# Patient Record
Sex: Female | Born: 1980
Health system: Southern US, Community
[De-identification: ages and names within clinical notes are randomized; demographics above are authoritative.]

## PROBLEM LIST (undated history)

## (undated) ENCOUNTER — Emergency Department (HOSPITAL_COMMUNITY): Admission: EM | Payer: No Typology Code available for payment source

## (undated) DIAGNOSIS — R87629 Unspecified abnormal cytological findings in specimens from vagina: Secondary | ICD-10-CM

## (undated) DIAGNOSIS — I10 Essential (primary) hypertension: Secondary | ICD-10-CM

## (undated) DIAGNOSIS — K8301 Primary sclerosing cholangitis: Secondary | ICD-10-CM

## (undated) DIAGNOSIS — Z8489 Family history of other specified conditions: Secondary | ICD-10-CM

## (undated) DIAGNOSIS — K5190 Ulcerative colitis, unspecified, without complications: Secondary | ICD-10-CM

## (undated) DIAGNOSIS — B009 Herpesviral infection, unspecified: Secondary | ICD-10-CM

## (undated) HISTORY — DX: Herpesviral infection, unspecified: B00.9

## (undated) HISTORY — PX: CERCLAGE REMOVAL: SUR1451

## (undated) HISTORY — DX: Unspecified abnormal cytological findings in specimens from vagina: R87.629

## (undated) HISTORY — DX: Family history of other specified conditions: Z84.89

## (undated) HISTORY — PX: LEEP: SHX91

## (undated) HISTORY — DX: Primary sclerosing cholangitis: K83.01

---
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
## 2001-06-02 ENCOUNTER — Inpatient Hospital Stay (HOSPITAL_COMMUNITY): Admission: AD | Admit: 2001-06-02 | Discharge: 2001-06-05 | Payer: Self-pay | Admitting: Obstetrics and Gynecology

## 2001-06-02 ENCOUNTER — Encounter (INDEPENDENT_AMBULATORY_CARE_PROVIDER_SITE_OTHER): Payer: Self-pay | Admitting: Specialist

## 2001-06-03 ENCOUNTER — Encounter: Payer: Self-pay | Admitting: *Deleted

## 2001-06-11 ENCOUNTER — Inpatient Hospital Stay (HOSPITAL_COMMUNITY): Admission: AD | Admit: 2001-06-11 | Discharge: 2001-06-11 | Payer: Self-pay | Admitting: Obstetrics and Gynecology

## 2001-07-16 ENCOUNTER — Encounter: Admission: RE | Admit: 2001-07-16 | Discharge: 2001-07-16 | Payer: Self-pay | Admitting: *Deleted

## 2001-07-16 ENCOUNTER — Other Ambulatory Visit: Admission: RE | Admit: 2001-07-16 | Discharge: 2001-07-16 | Payer: Self-pay | Admitting: *Deleted

## 2004-03-24 ENCOUNTER — Emergency Department (HOSPITAL_COMMUNITY): Admission: EM | Admit: 2004-03-24 | Discharge: 2004-03-24 | Payer: Self-pay | Admitting: Family Medicine

## 2006-07-16 ENCOUNTER — Ambulatory Visit (HOSPITAL_COMMUNITY): Admission: RE | Admit: 2006-07-16 | Discharge: 2006-07-16 | Payer: Self-pay

## 2007-04-13 ENCOUNTER — Encounter: Admission: RE | Admit: 2007-04-13 | Discharge: 2007-04-13 | Payer: Self-pay | Admitting: Family Medicine

## 2007-04-28 ENCOUNTER — Ambulatory Visit (HOSPITAL_COMMUNITY): Admission: RE | Admit: 2007-04-28 | Discharge: 2007-04-28 | Payer: Self-pay | Admitting: Obstetrics and Gynecology

## 2007-06-14 ENCOUNTER — Ambulatory Visit (HOSPITAL_COMMUNITY): Admission: RE | Admit: 2007-06-14 | Discharge: 2007-06-14 | Payer: Self-pay | Admitting: Obstetrics and Gynecology

## 2007-06-25 ENCOUNTER — Ambulatory Visit (HOSPITAL_COMMUNITY): Admission: RE | Admit: 2007-06-25 | Discharge: 2007-06-25 | Payer: Self-pay | Admitting: Obstetrics and Gynecology

## 2007-07-06 ENCOUNTER — Encounter: Admission: RE | Admit: 2007-07-06 | Discharge: 2007-07-06 | Payer: Self-pay | Admitting: Obstetrics and Gynecology

## 2007-07-13 ENCOUNTER — Ambulatory Visit (HOSPITAL_COMMUNITY): Admission: RE | Admit: 2007-07-13 | Discharge: 2007-07-13 | Payer: Self-pay | Admitting: Obstetrics and Gynecology

## 2007-08-11 ENCOUNTER — Ambulatory Visit (HOSPITAL_COMMUNITY): Admission: RE | Admit: 2007-08-11 | Discharge: 2007-08-11 | Payer: Self-pay | Admitting: Obstetrics and Gynecology

## 2007-08-16 ENCOUNTER — Ambulatory Visit (HOSPITAL_COMMUNITY): Admission: RE | Admit: 2007-08-16 | Discharge: 2007-08-16 | Payer: Self-pay | Admitting: Obstetrics and Gynecology

## 2007-08-26 ENCOUNTER — Encounter: Payer: Self-pay | Admitting: Obstetrics and Gynecology

## 2007-08-26 ENCOUNTER — Inpatient Hospital Stay (HOSPITAL_COMMUNITY): Admission: AD | Admit: 2007-08-26 | Discharge: 2007-08-29 | Payer: Self-pay | Admitting: Obstetrics and Gynecology

## 2007-08-26 ENCOUNTER — Encounter (INDEPENDENT_AMBULATORY_CARE_PROVIDER_SITE_OTHER): Payer: Self-pay | Admitting: Obstetrics and Gynecology

## 2007-08-30 ENCOUNTER — Encounter: Admission: RE | Admit: 2007-08-30 | Discharge: 2007-08-31 | Payer: Self-pay | Admitting: Obstetrics and Gynecology

## 2007-11-17 ENCOUNTER — Ambulatory Visit: Admission: RE | Admit: 2007-11-17 | Discharge: 2007-11-17 | Payer: Self-pay | Admitting: Obstetrics and Gynecology

## 2009-04-05 ENCOUNTER — Emergency Department (HOSPITAL_COMMUNITY): Admission: EM | Admit: 2009-04-05 | Discharge: 2009-04-05 | Payer: Self-pay | Admitting: Family Medicine

## 2009-04-13 ENCOUNTER — Emergency Department (HOSPITAL_COMMUNITY): Admission: EM | Admit: 2009-04-13 | Discharge: 2009-04-13 | Payer: Self-pay | Admitting: Family Medicine

## 2009-06-11 ENCOUNTER — Ambulatory Visit (HOSPITAL_COMMUNITY): Admission: RE | Admit: 2009-06-11 | Discharge: 2009-06-11 | Payer: Self-pay | Admitting: Gastroenterology

## 2009-07-13 IMAGING — US US OB FOLLOW-UP
1 series · 14 of 28 positions shown · non-contrast
Comparison: none

OBSTETRICAL ULTRASOUND:
 This ultrasound was performed in The [HOSPITAL], and the [facility] report will be stored to [REDACTED] PACS.

[Series 1: us ob follow-up · 97 acquisitions, 14 frames shown]
[im 4/97]
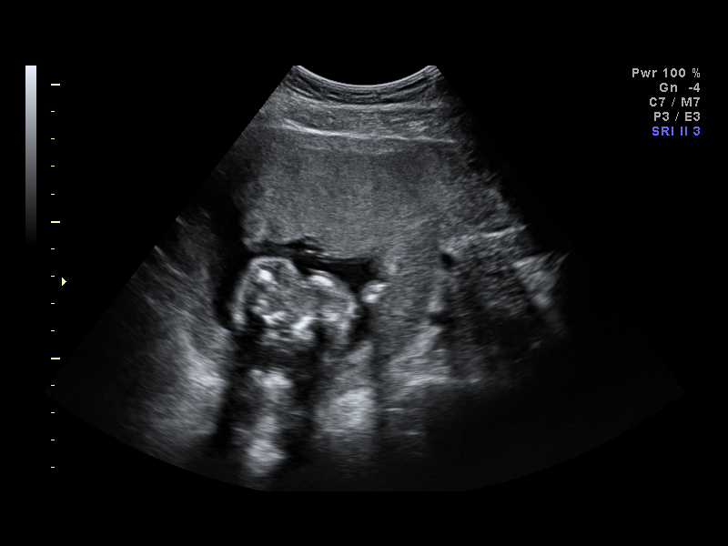
[im 11/97]
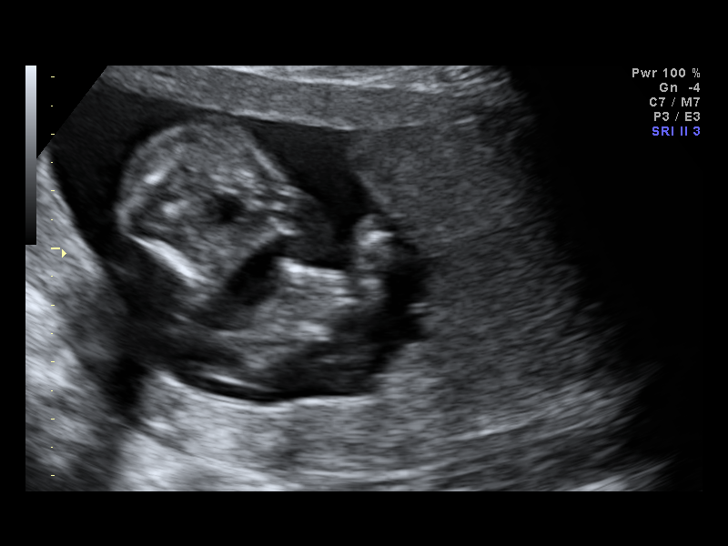
[im 18/97]
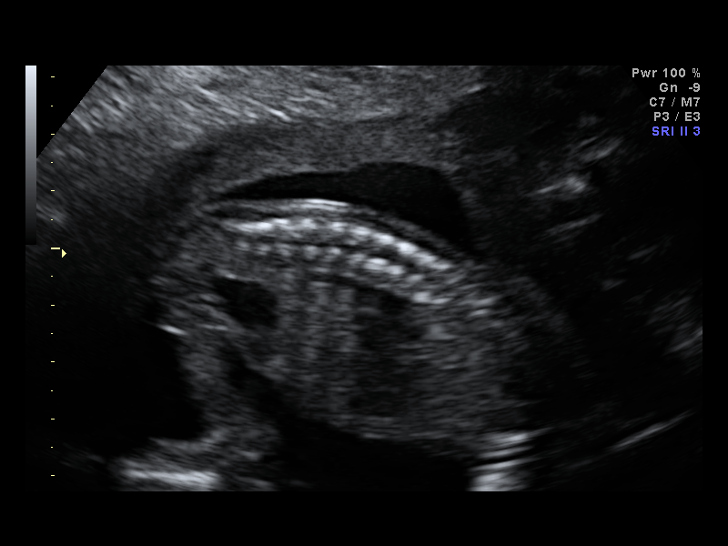
[im 25/97]
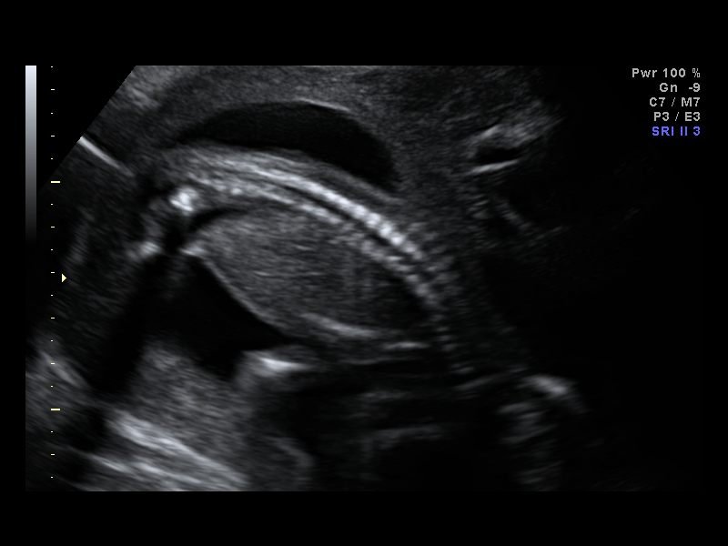
[im 33/97]
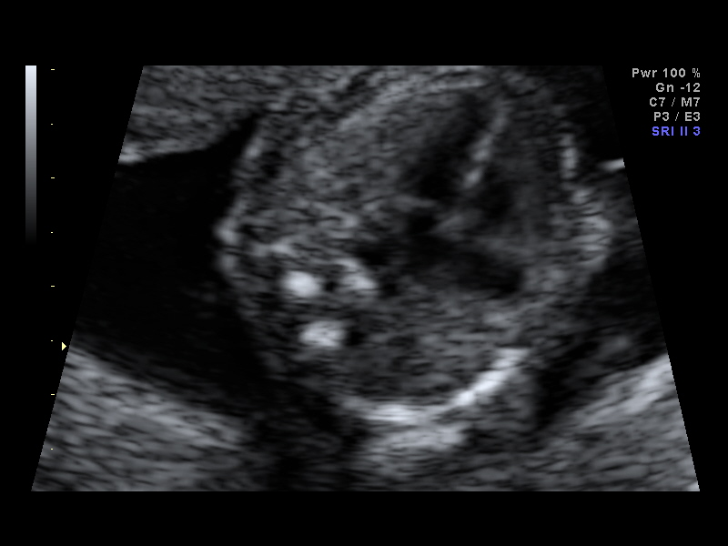
[im 40/97]
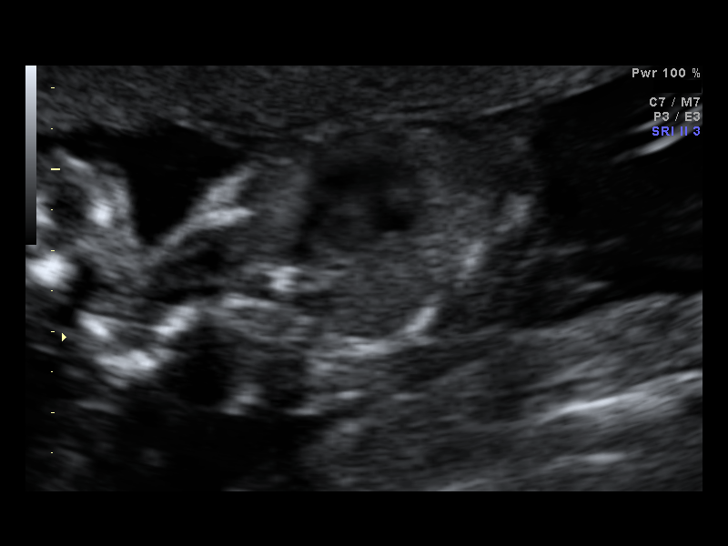
[im 47/97]
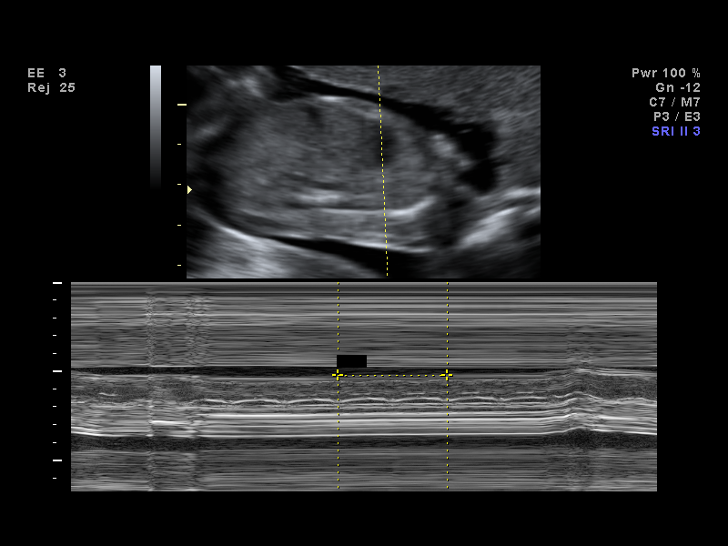
[im 54/97]
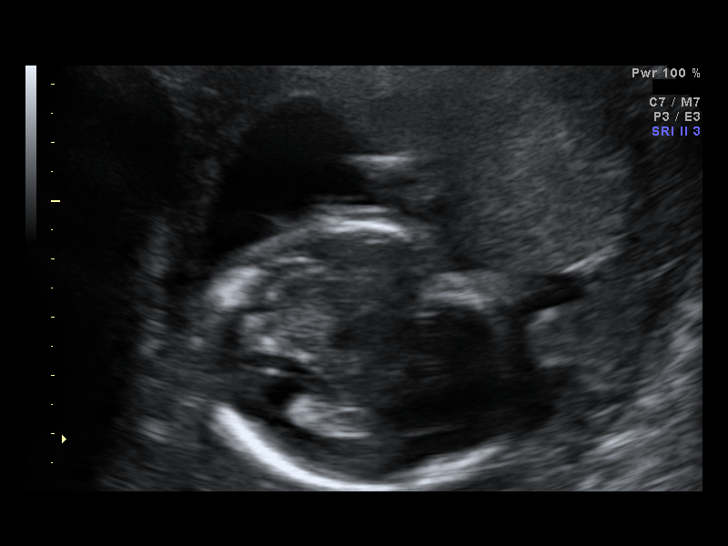
[im 61/97]
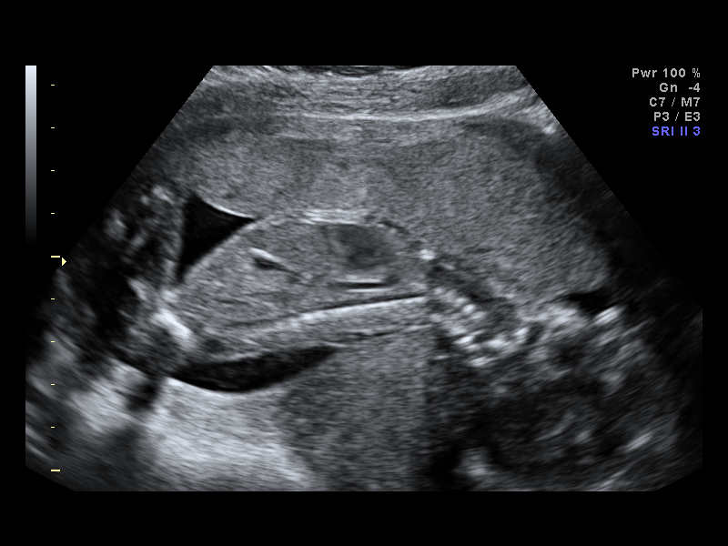
[im 68/97]
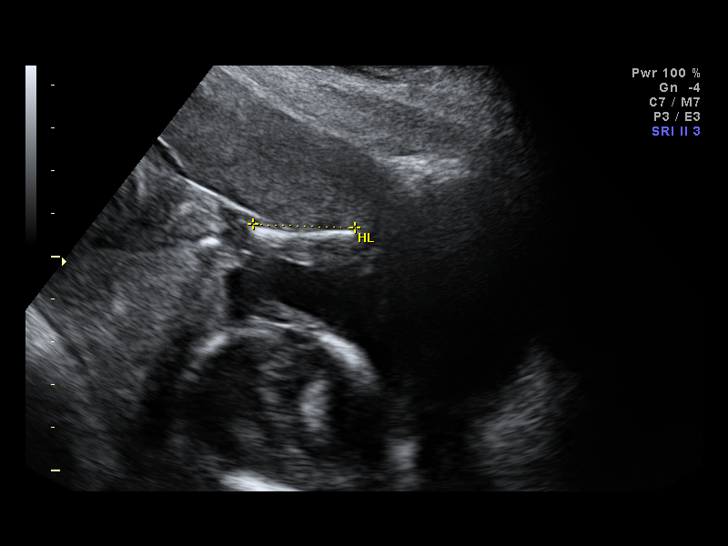
[im 75/97]
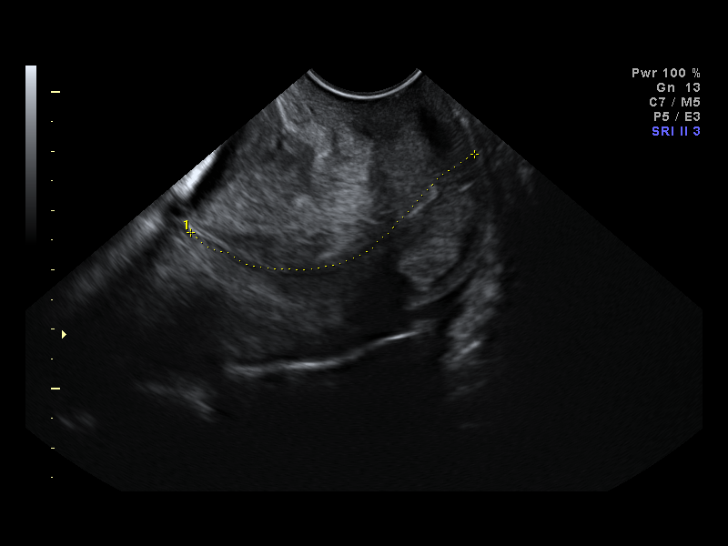
[im 82/97]
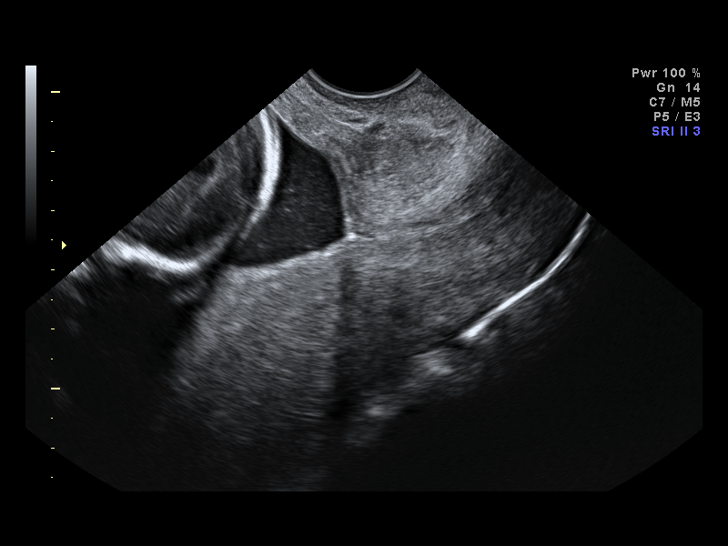
[im 89/97]
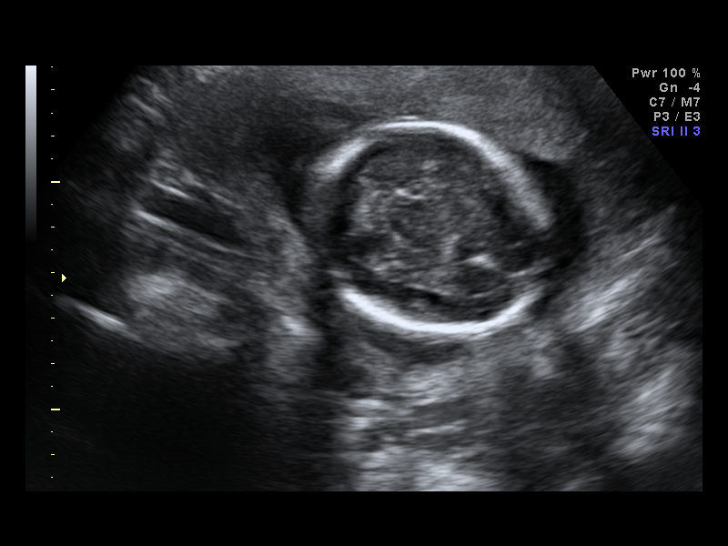
[im 97/97]
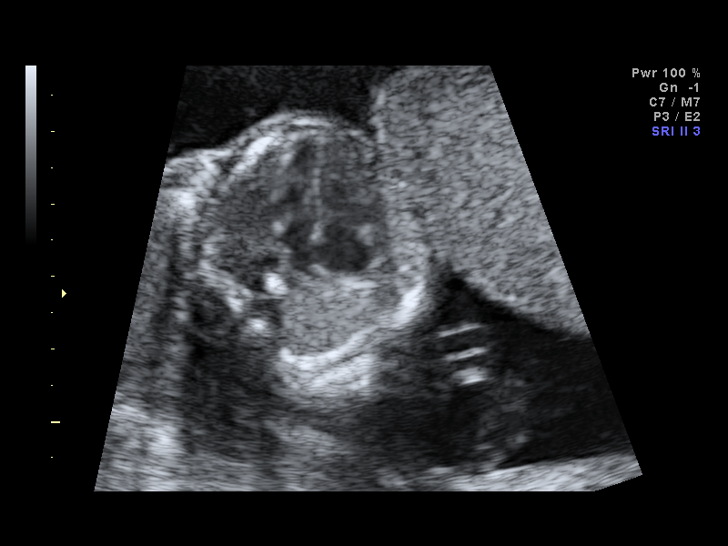

[14 of 28 positions shown; findings below may reference images not displayed]

IMPRESSION: The [facility] report has also been faxed to the ordering physician.

## 2009-11-28 ENCOUNTER — Ambulatory Visit (HOSPITAL_COMMUNITY): Admission: RE | Admit: 2009-11-28 | Discharge: 2009-11-28 | Payer: Self-pay | Admitting: Gastroenterology

## 2010-02-07 ENCOUNTER — Ambulatory Visit (HOSPITAL_COMMUNITY)
Admission: RE | Admit: 2010-02-07 | Discharge: 2010-02-07 | Payer: Self-pay | Source: Home / Self Care | Attending: Obstetrics and Gynecology | Admitting: Obstetrics and Gynecology

## 2010-03-09 ENCOUNTER — Encounter: Payer: Self-pay | Admitting: Gastroenterology

## 2010-07-02 ENCOUNTER — Inpatient Hospital Stay (INDEPENDENT_AMBULATORY_CARE_PROVIDER_SITE_OTHER)
Admission: RE | Admit: 2010-07-02 | Discharge: 2010-07-02 | Disposition: A | Discharge status: Home / Self Care | Payer: Self-pay | Source: Ambulatory Visit | Attending: Family Medicine | Admitting: Family Medicine

## 2010-07-02 DIAGNOSIS — S139XXA Sprain of joints and ligaments of unspecified parts of neck, initial encounter: Secondary | ICD-10-CM

## 2010-07-02 NOTE — Op Note (Signed)
[name], [name]            ACCOUNT NO.:  [id number]   MEDICAL RECORD NO.:  [id number]          PATIENT TYPE:  AMB   LOCATION:  SDC                           FACILITY:  [facility]   PHYSICIAN:  Maxie Better, M.D.DATE OF BIRTH:  1981/01/14   DATE OF PROCEDURE:  06/14/2007  DATE OF DISCHARGE:                               OPERATIVE REPORT   PREOPERATIVE DIAGNOSES:  1. Cervical incompetence.  2. Intrauterine gestation at 14-1/7 weeks.   PROCEDURE:  McDonald cerclage placement.   POSTOPERATIVE DIAGNOSES:  1. Cervical incompetence.  2. Intrauterine gestation of 14-1/7 weeks.   ANESTHESIA:  Spinal.   SURGEON:  Maxie Better, M.D.   ASSISTANT:  None.   INDICATION:  A 30 year old gravida 2, para 0 female, with second  trimester twin losses, who is now at 14-1/7 weeks, for cerclage  placement.  Surgical risk was reviewed with the patient, consent was  signed and the patient was transferred to the operating room.   PROCEDURE:  Under adequate spinal anesthesia the patient was placed in  the dorsal lithotomy position.  She was sterilely prepped and draped in  the usual fashion.  The bladder was catheterized for a moderate amount  of urine.  A weighted speculum was placed in the vagina.  The vagina was  prepped with sterile water.  The anterior and posterior lips of the  cervix were grasped with DeLee clamps and a #1 Prolene was placed in the  usual fashion as for a McDonald suture, and Ethibond was used for  tagging of the stitch anterior.  The suture knot was placed at the 12  o'clock position.  The procedure was then completed.  All instruments  were removed from the vagina.   SPECIMEN:  None.   ESTIMATED BLOOD LOSS:  Minimal.   COMPLICATIONS:  None.   The patient tolerated the procedure well and was transferred to recovery  in stable condition.      Maxie Better, M.D.  Electronically Signed     [place]/MEDQ  D:  06/14/2007  T:  06/14/2007  Job:  [id number]

## 2010-07-02 NOTE — Op Note (Signed)
NAMEGALILEA, QUITO            ACCOUNT NO.:  [id number]   MEDICAL RECORD NO.:  [id number]          PATIENT TYPE:  INP   LOCATION:  9106                          [facility]ACILITY:  [facility]   PHYSICIAN:  Maxie Better, M.D.DATE OF BIRTH:  1980-07-21   DATE OF PROCEDURE:  08/26/2007  DATE OF DISCHARGE:                               OPERATIVE REPORT   PREOPERATIVE DIAGNOSES:  1. Intrauterine growth restriction.  2. Anhydramnios.  3. Intrauterine gestation at 24-5/7th weeks.  4. Chronic hypertension.  5. Cervical incompetence.   PROCEDURES:  1. Primary cesarean section.  2. Classical hysterotomy.  3. Cerclage removal.   POSTOPERATIVE DIAGNOSES:  1. Intrauterine growth restriction.  2. Anhydramnios.  3. Chronic hypertension.  4. Intrauterine gestation at 24-5/7th weeks.  5. Cervical incompetence.   ANESTHESIA:  Spinal.   SURGEON:  Maxie Better, MD   ASSISTANT:  None.   INDICATIONS:  A 30 year old gravida 2, para 0-1-0-0 black female at 99-  5/7th weeks' gestation, who was found to have intrauterine growth  restriction with biophysical profile of 2/8 and anhydramnios, on  maternal fetal medicine evaluation on August 26, 2007.  The patient was  diagnosed with chronic hypertension, and was placed on labetalol and  Aldomet.  She was being followed for low fluid from about 21 weeks, and  subsequently had absence of fluid even though she has been documented to  have had no evidence of ruptured membranes.  Given the findings,  decision was made to proceed with delivery, a consent was signed,  procedure explained, the patient was transferred to the operating room.   PROCEDURE:  Under adequate spinal anesthesia, the patient was placed in  the supine position with a left lateral tilt.  She was sterilely prepped  and draped in usual fashion.  Indwelling Foley catheter was sterilely  placed.  A Pfannenstiel skin incision was then made and carried down to  the rectus fascia.  The  rectus fascia was opened transversely.  The  rectus fascia was then bluntly and sharply dissected off the rectus  muscle in a superior and inferior fashion.  The rectus muscles split in  midline.  The parietal peritoneum was entered bluntly and extended.  The  vesicouterine peritoneum was opened transversely.  A classical uterine  incision was then made and extended.  Subsequent careful delivery of a  live female was accomplished.  The baby was transferred to the awaiting  pediatrician, who assigned Apgars of 2, 5, and 6 at one, five, and ten  minutes.  Cord pH was obtained, which was 7.20.  The placenta was  manually removed and sent to pathology.  Uterine cavity was cleaned of  debris.  Uterine incision was closed with 0 Monocryl, running  interrupted lock stitch, and interrupted stitch onto the serosal surface  was reached, at which time 2-0 Monocryl suture was then used.  Bleeding  in the midportion of it required multiple Monocryl sutures with final  hemostasis accomplished.  Normal tubes and ovaries were noted  bilaterally.  The uterus was then returned back to the abdomen.  The  abdomen was then copiously irrigated and suctioned of  debris.  Reinspection of the site showed good hemostasis.  The parietal  peritoneum was closed with 2-0 Vicryl.  The rectus fascia was closed  with 0 Vicryl x2.  The subcutaneous area was closed with interrupted 2-0  plain, and the skin approximated using Ethicon staples.   SPECIMENS:  Placenta sent to pathology.   ESTIMATED BLOOD LOSS:  600 mL.   URINE OUTPUT:  1800 mL.   INTRAOPERATIVE FLUID:  2800 mL.   Sponge and instrument counts x2 was correct.  At that point, the patient  was then placed in a lithotomy position.  A bivalve speculum was then  placed in the vagina.  The cervix was finally grasped, and the cerclage  knot identified and cut without incident intact.  At that point, the  procedure was completed by then transferring the patient to  the recovery  room in stable condition.  Weight of the baby was 400 g, and is in the  neonatal intensive care unit.      Maxie Better, M.D.  Electronically Signed     [place]/MEDQ  D:  08/26/2007  T:  08/27/2007  Job:  [id number]

## 2010-07-05 NOTE — Discharge Summary (Signed)
Jamie Duarte, Jamie Duarte            ACCOUNT NO.:  [id number]   MEDICAL RECORD NO.:  [id number]          PATIENT TYPE:  INP   LOCATION:  9106                          [facility]ILITY:  [facility]   PHYSICIAN:  Maxie Better, M.D.DATE OF BIRTH:  November 20, 1980   DATE OF ADMISSION:  08/26/2007  DATE OF DISCHARGE:  08/29/2007                               DISCHARGE SUMMARY   ADMISSION DIAGNOSES:  1. Chronic hypertension.  2. Intrauterine growth restriction with abnormal fetal tracing      (biophysical profile 2/8, absent end-diastolic flow).   DISCHARGE DIAGNOSES:  1. Intrauterine gestation 24 weeks, delivered.  2. Chronic hypertension.  3. Abnormal fetal testing.   PROCEDURE:  Classical cesarean section.   HISTORY OF PRESENT ILLNESS:  A 30 year old gravida 2, para 0-1-0-0  female at 32 and 5/7 weeks with chronic hypertension, was found by  Maternal-Fetal Medicine to  have a biophysical profile of 2/8, absent  end-diastolic flow.  The patient with known intrauterine growth  restriction starting about 2 weeks previously.  She has underlying  chronic hypertension.  Given the findings, recommendation is to deliver.   HOSPITAL COURSE:  The patient was admitted.  She went to the operating  room, 400 g live female was delivered with Apgars of 2, 5, and 6.  Baby  was transferred to NICU.  Cord pH was 7.2.  Placental pathology showed  immature placenta.  Her CBC on postop day #1 showed a hemoglobin of  11.5, hematocrit 33.6, white count of 13.7, and platelets 220,000.  Postop day #3, the patient was doing well.  She was continued on her  blood pressure medications and deemed likely to be discharged home.   DISPOSITION:  Home.   CONDITION:  Stable.   DISCHARGE MEDICATIONS:  1. Labetalol 300 mg p.o. b.i.d.  2. Aldomet 250 mg p.o. b.i.d.  3. Asacol.  4. Urso Forte.  5. Prenatal vitamins.  6. Percocet 1-2 tablets every 4 to 6 hours p.r.n. pain.   FOLLOWUP APPOINTMENT:  At [facility] on  Thursday for staple removal  and 6 weeks for regular postpartum care.   DISCHARGE INSTRUCTIONS:  Per the postpartum booklet given.      Maxie Better, M.D.  Electronically Signed     [place]/MEDQ  D:  09/23/2007  T:  09/23/2007  Job:  [id number]

## 2010-11-12 LAB — CBC
HCT: 33.3 — ABNORMAL LOW
Hemoglobin: 11.8 — ABNORMAL LOW
MCHC: 35.4
MCV: 87.5
Platelets: 207
RBC: 3.81 — ABNORMAL LOW
RDW: 12.5
WBC: 6

## 2010-11-14 LAB — CBC
HCT: 33.6 — ABNORMAL LOW
HCT: 39.6
Hemoglobin: 11.5 — ABNORMAL LOW
Hemoglobin: 13.5
MCHC: 34.1
MCHC: 34.4
MCV: 92.7
MCV: 93.2
Platelets: 220
Platelets: 255
RBC: 3.6 — ABNORMAL LOW
RBC: 4.28
RDW: 13.2
RDW: 13.3
WBC: 13.7 — ABNORMAL HIGH
WBC: 8.1

## 2010-11-14 LAB — TOXOPLASMA ANTIBODIES- IGG AND  IGM
Toxoplasma Antibody- IgM: 0.56 IV
Toxoplasma IgG Ratio: 1.4 IU/mL

## 2010-11-14 LAB — HSV(HERPES SMPLX)ABS-I+II(IGG+IGM)-BLD
Herpes Simplex Vrs I + II Ab, IgG: 0.2 IV
Herpes Simplex Vrs I&II-IgM Ab (EIA): NOT DETECTED

## 2010-11-14 LAB — CYTOMEGALOVIRUS ANTIBODY, IGG: Cytomegalovirus(CMV) Antibody, IgG: POSITIVE — AB

## 2010-11-14 LAB — RPR: RPR Ser Ql: NONREACTIVE

## 2010-11-14 LAB — CMV IGM: CMV IgM: <0.9 Index (ref <0.90)

## 2011-08-04 ENCOUNTER — Ambulatory Visit (HOSPITAL_COMMUNITY): Admission: RE | Admit: 2011-08-04 | Payer: Self-pay | Source: Ambulatory Visit | Admitting: Gastroenterology

## 2011-08-04 ENCOUNTER — Encounter (HOSPITAL_COMMUNITY): Admission: RE | Payer: Self-pay | Source: Ambulatory Visit

## 2011-08-04 SURGERY — COLONOSCOPY
Anesthesia: Moderate Sedation

## 2013-11-18 NOTE — Addendum Note (Signed)
Encounter addended by: Theodis Blaze, MD on: 11/18/2013  3:16 PM<BR>     Documentation filed: Orders

## 2014-02-21 ENCOUNTER — Other Ambulatory Visit: Payer: Self-pay | Admitting: Gastroenterology

## 2014-02-22 ENCOUNTER — Other Ambulatory Visit: Payer: Self-pay | Admitting: Gastroenterology

## 2014-02-22 DIAGNOSIS — R945 Abnormal results of liver function studies: Principal | ICD-10-CM

## 2014-02-22 DIAGNOSIS — R7989 Other specified abnormal findings of blood chemistry: Secondary | ICD-10-CM

## 2014-03-07 ENCOUNTER — Other Ambulatory Visit: Payer: Self-pay | Admitting: Gastroenterology

## 2014-03-07 ENCOUNTER — Ambulatory Visit (HOSPITAL_COMMUNITY)
Admission: RE | Admit: 2014-03-07 | Discharge: 2014-03-07 | Disposition: A | Discharge status: Home / Self Care | Payer: 59 | Source: Ambulatory Visit | Attending: Gastroenterology | Admitting: Gastroenterology

## 2014-03-07 DIAGNOSIS — R7989 Other specified abnormal findings of blood chemistry: Secondary | ICD-10-CM | POA: Insufficient documentation

## 2014-03-07 DIAGNOSIS — R945 Abnormal results of liver function studies: Secondary | ICD-10-CM

## 2014-03-07 MED ORDER — GADOBENATE DIMEGLUMINE 529 MG/ML IV SOLN
20.0000 mL | Freq: Once | INTRAVENOUS | Status: AC | PRN
  Administered 2014-03-07: 17 mL via INTRAVENOUS

## 2014-07-13 ENCOUNTER — Other Ambulatory Visit (HOSPITAL_COMMUNITY)
Admission: RE | Admit: 2014-07-13 | Discharge: 2014-07-13 | Disposition: A | Discharge status: Home / Self Care | Payer: 59 | Source: Ambulatory Visit | Attending: Gastroenterology | Admitting: Gastroenterology

## 2014-07-13 DIAGNOSIS — K5190 Ulcerative colitis, unspecified, without complications: Secondary | ICD-10-CM | POA: Diagnosis not present

## 2014-07-13 LAB — CBC WITH DIFFERENTIAL/PLATELET
Basophils Absolute: 0 10*3/uL (ref 0.0–0.1)
Basophils Relative: 0 % (ref 0–1)
Eosinophils Absolute: 0.2 10*3/uL (ref 0.0–0.7)
Eosinophils Relative: 6 % — ABNORMAL HIGH (ref 0–5)
HCT: 30.3 % — ABNORMAL LOW (ref 36.0–46.0)
Hemoglobin: 9.9 g/dL — ABNORMAL LOW (ref 12.0–15.0)
Lymphocytes Relative: 38 % (ref 12–46)
Lymphs Abs: 1.3 10*3/uL (ref 0.7–4.0)
MCH: 30.8 pg (ref 26.0–34.0)
MCHC: 32.7 g/dL (ref 30.0–36.0)
MCV: 94.4 fL (ref 78.0–100.0)
Monocytes Absolute: 0.5 10*3/uL (ref 0.1–1.0)
Monocytes Relative: 13 % — ABNORMAL HIGH (ref 3–12)
Neutro Abs: 1.5 10*3/uL — ABNORMAL LOW (ref 1.7–7.7)
Neutrophils Relative %: 43 % (ref 43–77)
Platelets: 315 10*3/uL (ref 150–400)
RBC: 3.21 MIL/uL — ABNORMAL LOW (ref 3.87–5.11)
RDW: 12.8 % (ref 11.5–15.5)
WBC: 3.4 10*3/uL — ABNORMAL LOW (ref 4.0–10.5)

## 2014-08-30 ENCOUNTER — Emergency Department (HOSPITAL_BASED_OUTPATIENT_CLINIC_OR_DEPARTMENT_OTHER): Payer: 59

## 2014-08-30 ENCOUNTER — Emergency Department (HOSPITAL_BASED_OUTPATIENT_CLINIC_OR_DEPARTMENT_OTHER)
Admission: EM | Admit: 2014-08-30 | Discharge: 2014-08-30 | Disposition: A | Discharge status: Home / Self Care | Payer: 59 | Attending: Emergency Medicine | Admitting: Emergency Medicine

## 2014-08-30 ENCOUNTER — Encounter (HOSPITAL_BASED_OUTPATIENT_CLINIC_OR_DEPARTMENT_OTHER): Payer: Self-pay | Admitting: Emergency Medicine

## 2014-08-30 DIAGNOSIS — R079 Chest pain, unspecified: Secondary | ICD-10-CM | POA: Diagnosis present

## 2014-08-30 DIAGNOSIS — Z79899 Other long term (current) drug therapy: Secondary | ICD-10-CM | POA: Diagnosis not present

## 2014-08-30 DIAGNOSIS — R0789 Other chest pain: Secondary | ICD-10-CM | POA: Diagnosis not present

## 2014-08-30 DIAGNOSIS — Z8719 Personal history of other diseases of the digestive system: Secondary | ICD-10-CM | POA: Insufficient documentation

## 2014-08-30 DIAGNOSIS — J90 Pleural effusion, not elsewhere classified: Secondary | ICD-10-CM | POA: Insufficient documentation

## 2014-08-30 DIAGNOSIS — I10 Essential (primary) hypertension: Secondary | ICD-10-CM | POA: Diagnosis not present

## 2014-08-30 HISTORY — DX: Ulcerative colitis, unspecified, without complications: K51.90

## 2014-08-30 HISTORY — DX: Essential (primary) hypertension: I10

## 2014-08-30 MED ORDER — IBUPROFEN 200 MG PO TABS
600.0000 mg | ORAL_TABLET | Freq: Once | ORAL | Status: AC
  Administered 2014-08-30: 600 mg via ORAL
  Filled 2014-08-30 (×2): qty 1

## 2014-08-30 NOTE — ED Notes (Signed)
Pt having left sided chest pain which reproduces with palpation and deep breath.  No sob, no N/V.  No diaphoresis.  Pt states she works out at [employer].

## 2014-08-30 NOTE — ED Notes (Signed)
Patient transported to X-ray 

## 2014-08-30 NOTE — ED Provider Notes (Signed)
CSN: [id number]     Arrival date & time 08/30/14  1004 History   First MD Initiated Contact with Patient 08/30/14 1113     Chief Complaint  Patient presents with  . Chest Pain     Patient is a 34 y.o. female presenting with chest pain. The history is provided by the patient. No language interpreter was used.  Chest Pain  [name] presents for evaluation of left sided chest pain.  She worked out in [employer] two days ago and yesterday developed left upper chest wall pain.  Pain is worse with ROM and deep breaths.  Pain is constant and gradually worsening.  She denies any fevers, SOB, abdominal pain, leg swelling or pain.  No hx/o DVT/PE.  She has UC, PSC and takes birth control. No recent surgery.  Sxs are moderate, constant, worsening.    Past Medical History  Diagnosis Date  . Hypertension   . Ulcerative colitis    Past Surgical History  Procedure Laterality Date  . Cesarean section     No family history on file. History  Substance Use Topics  . Smoking status: Never Smoker   . Smokeless tobacco: Not on file  . Alcohol Use: Not on file   OB History    No data available     Review of Systems  Cardiovascular: Positive for chest pain.  All other systems reviewed and are negative.     Allergies  Review of patient's allergies indicates no known allergies.  Home Medications   Prior to Admission medications   Medication Sig Start Date End Date Taking? Authorizing Provider  hydrochlorothiazide (HYDRODIURIL) 25 MG tablet Take 25 mg by mouth daily.   Yes Historical Provider, MD  metoprolol (LOPRESSOR) 50 MG tablet Take 50 mg by mouth 2 (two) times daily.   Yes Historical Provider, MD  ursodiol (ACTIGALL) 500 MG tablet Take 500 mg by mouth 3 (three) times daily.   Yes Historical Provider, MD   BP 132/77 mmHg  Pulse 78  Temp(Src) 99.1 F (37.3 C) (Oral)  Resp 18  Ht 5\' 7"  (1.702 m)  Wt 164 lb (74.39 kg)  BMI 25.68 kg/m2  SpO2 100%  LMP 08/21/2014 Physical Exam   Constitutional: She is oriented to person, place, and time. She appears well-developed and well-nourished.  HENT:  Head: Normocephalic and atraumatic.  Cardiovascular: Normal rate and regular rhythm.   No murmur heard. Pulmonary/Chest: Effort normal and breath sounds normal. No respiratory distress.  Left upper chest wall tenderness  Abdominal: Soft. There is no tenderness. There is no rebound and no guarding.  Musculoskeletal: She exhibits no edema or tenderness.  Neurological: She is alert and oriented to person, place, and time.  Skin: Skin is warm and dry.  Psychiatric: She has a normal mood and affect. Her behavior is normal.  Nursing note and vitals reviewed.   ED Course  Procedures (including critical care time) Labs Review Labs Reviewed - No data to display  Imaging Review Dg Chest 2 View  08/30/2014   CLINICAL DATA:  Left chest pain, pleuritic.  EXAM: CHEST  2 VIEW  COMPARISON:  None.  FINDINGS: Borderline enlargement of the cardiopericardial silhouette, without edema.  Trace blunting of the left posterior costophrenic angle.  The lungs appear otherwise clear.  IMPRESSION: 1. Trace left pleural effusion. 2. Borderline cardiomegaly.   Electronically Signed   By: Van Clines M.D.   On: 08/30/2014 12:03     EKG Interpretation   Date/Time:  Wednesday August 30 2014 10:15:58 EDT Ventricular Rate:  79 PR Interval:  150 QRS Duration: 82 QT Interval:  378 QTC Calculation: 433 R Axis:   58 Text Interpretation:  Normal sinus rhythm Normal ECG Confirmed by Hazle Coca [phone]) on 08/30/2014 10:25:32 AM      MDM   Final diagnoses:  Acute chest wall pain  Pleural effusion on left    Pt here for left sided chest pain, reproducible on exam.  Hx and presentation is not c/w ACS, dissection, PE.  No evidence of pna.  Discussed with pt home care for chest wall pain, need for follow up for trace pleural effusion.  Recommend home care with NSAIDs, return precautions discussed.       Quintella Reichert, MD 08/30/14 1252

## 2014-08-30 NOTE — Discharge Instructions (Signed)
Take ibuprofen, available over the counter for the next 5 days.  Your chest xray showed a very small amount of fluid on your left lung.  Please get this rechecked by your doctor.  Get rechecked immediately if you develop any new or worrisome symptoms.     Chest Wall Pain Chest wall pain is pain in or around the bones and muscles of your chest. It may take up to 6 weeks to get better. It may take longer if you must stay physically active in your work and activities.  CAUSES  Chest wall pain may happen on its own. However, it may be caused by:  A viral illness like the flu.  Injury.  Coughing.  Exercise.  Arthritis.  Fibromyalgia.  Shingles. HOME CARE INSTRUCTIONS   Avoid overtiring physical activity. Try not to strain or perform activities that cause pain. This includes any activities using your chest or your abdominal and side muscles, especially if heavy weights are used.  Put ice on the sore area.  Put ice in a plastic bag.  Place a towel between your skin and the bag.  Leave the ice on for 15-20 minutes per hour while awake for the first 2 days.  Only take over-the-counter or prescription medicines for pain, discomfort, or fever as directed by your caregiver. SEEK IMMEDIATE MEDICAL CARE IF:   Your pain increases, or you are very uncomfortable.  You have a fever.  Your chest pain becomes worse.  You have new, unexplained symptoms.  You have nausea or vomiting.  You feel sweaty or lightheaded.  You have a cough with phlegm (sputum), or you cough up blood. MAKE SURE YOU:   Understand these instructions.  Will watch your condition.  Will get help right away if you are not doing well or get worse. Document Released: 02/03/2005 Document Revised: 04/28/2011 Document Reviewed: 09/30/2010 [facility] Patient Information 2015 [place], [place]. This information is not intended to replace advice given to you by your health care provider. Make sure you discuss any questions  you have with your health care provider.  Pleurisy Pleurisy is an inflammation and swelling of the lining of the lungs (pleura). Because of this inflammation, it hurts to breathe. It can be aggravated by coughing, laughing, or deep breathing. Pleurisy is often caused by an underlying infection or disease.  HOME CARE INSTRUCTIONS  Monitor your pleurisy for any changes. The following actions may help to alleviate any discomfort you are experiencing:  Medicine may help with pain. Only take over-the-counter or prescription medicines for pain, discomfort, or fever as directed by your health care provider.  Only take antibiotic medicine as directed. Make sure to finish it even if you start to feel better. SEEK MEDICAL CARE IF:   Your pain is not controlled with medicine or is increasing.  You have an increase in pus-like (purulent) secretions brought up with coughing. SEEK IMMEDIATE MEDICAL CARE IF:   You have blue or dark lips, fingernails, or toenails.  You are coughing up blood.  You have increased difficulty breathing.  You have continuing pain unrelieved by medicine or pain lasting more than 1 week.  You have pain that radiates into your neck, arms, or jaw.  You develop increased shortness of breath or wheezing.  You develop a fever, rash, vomiting, fainting, or other serious symptoms. MAKE SURE YOU:  Understand these instructions.   Will watch your condition.   Will get help right away if you are not doing well or get worse.  Document  Released: 02/03/2005 Document Revised: 10/06/2012 Document Reviewed: 07/18/2012 [facility] Patient Information 2015 [place], [place]. This information is not intended to replace advice given to you by your health care provider. Make sure you discuss any questions you have with your health care provider.

## 2015-02-26 MED FILL — URSODIOL 500 MG TABLET: 500 | 30 days supply | Qty: 60 | Fill #1

## 2015-02-26 MED FILL — GAVILYTE-G SOLUTION: 236 | 1 days supply | Qty: 4000 | Fill #0

## 2015-03-05 DIAGNOSIS — D509 Iron deficiency anemia, unspecified: Secondary | ICD-10-CM | POA: Diagnosis not present

## 2015-03-05 DIAGNOSIS — K5190 Ulcerative colitis, unspecified, without complications: Secondary | ICD-10-CM | POA: Diagnosis not present

## 2015-03-05 DIAGNOSIS — K529 Noninfective gastroenteritis and colitis, unspecified: Secondary | ICD-10-CM | POA: Diagnosis not present

## 2015-03-05 DIAGNOSIS — K31819 Angiodysplasia of stomach and duodenum without bleeding: Secondary | ICD-10-CM | POA: Diagnosis not present

## 2015-03-05 DIAGNOSIS — K5100 Ulcerative (chronic) pancolitis without complications: Secondary | ICD-10-CM | POA: Diagnosis not present

## 2015-03-05 DIAGNOSIS — Z1211 Encounter for screening for malignant neoplasm of colon: Secondary | ICD-10-CM | POA: Diagnosis not present

## 2015-03-05 DIAGNOSIS — K2970 Gastritis, unspecified, without bleeding: Secondary | ICD-10-CM | POA: Diagnosis not present

## 2015-03-12 MED FILL — HUMIRA PEN 40 MG/0.8ML PNKT: 40 | 28 days supply | Qty: 2 | Fill #5

## 2015-03-12 MED FILL — METOPROLOL SUCC ER 100 MG T: 100 | 90 days supply | Qty: 90 | Fill #0

## 2015-03-12 MED FILL — LIALDA 1.2 GM TABLET SA: 1.2 | 30 days supply | Qty: 120 | Fill #1

## 2015-03-19 DIAGNOSIS — L509 Urticaria, unspecified: Secondary | ICD-10-CM | POA: Diagnosis not present

## 2015-03-19 MED FILL — predniSONE 10 MG (21) TBPK: 10 | 6 days supply | Qty: 21 | Fill #0

## 2015-04-05 MED FILL — URSODIOL 500 MG TABLET: 500 | 30 days supply | Qty: 60 | Fill #2

## 2015-04-05 MED FILL — HUMIRA PEN 40 MG/0.8ML PNKT: 40 | 28 days supply | Qty: 2 | Fill #6

## 2015-04-10 DIAGNOSIS — Z09 Encounter for follow-up examination after completed treatment for conditions other than malignant neoplasm: Secondary | ICD-10-CM | POA: Diagnosis not present

## 2015-04-10 DIAGNOSIS — M79641 Pain in right hand: Secondary | ICD-10-CM | POA: Diagnosis not present

## 2015-04-11 MED FILL — azaTHIOprine 50 MG TABS: 50 | 30 days supply | Qty: 60 | Fill #0

## 2015-04-17 MED FILL — HUMIRA PEN 40 MG/0.8ML PNKT: 40 | 28 days supply | Qty: 4 | Fill #0

## 2015-04-23 MED FILL — LIALDA 1.2 GM TABLET SA: 1.2 | 30 days supply | Qty: 120 | Fill #2

## 2015-04-24 MED FILL — NORETHINDRONE 0.35 MG TAB: 0.35 | 84 days supply | Qty: 84 | Fill #3

## 2015-05-23 MED FILL — URSODIOL 500 MG TABLET: 500 | 30 days supply | Qty: 60 | Fill #3

## 2015-05-28 MED FILL — HUMIRA PEN 40 MG/0.8ML PNKT: 40 | 28 days supply | Qty: 2 | Fill #7

## 2015-06-03 ENCOUNTER — Telehealth: Payer: 59 | Admitting: Family

## 2015-06-03 DIAGNOSIS — T7840XA Allergy, unspecified, initial encounter: Secondary | ICD-10-CM | POA: Diagnosis not present

## 2015-06-03 MED ORDER — PREDNISONE 10 MG (21) PO TBPK: 10.0000 mg | ORAL_TABLET | ORAL | Status: DC

## 2015-06-03 MED ORDER — PREDNISONE 10 MG PO TABS: 10.0000 mg | ORAL_TABLET | Freq: Every day | ORAL | Status: DC

## 2015-06-03 NOTE — Progress Notes (Signed)
E Visit for Rash  We are sorry that you are not feeling well. Here is how we plan to help!  Based on what you shared with me it looks like you have contact dermatitis.  Contact dermatitis is a skin rash caused by something that touches the skin and causes irritation or inflammation.  Your skin may be red, swollen, dry, cracked, and itch.  The rash should go away in a few days but can last a few weeks.  If you get a rash, it's important to figure out what caused it so the irritant can be avoided in the future. and I have prescribed Prednisone 10 mg daily for 5 days  Obviously, this may also be the C4 preworkout as I know they keep changing that formula. I have personally taken that product as well and I am aware of some ingredient changes.  HOME CARE:   Take cool showers and avoid direct sunlight.  Apply cool compress or wet dressings.  Take a bath in an oatmeal bath.  Sprinkle content of one Aveeno packet under running faucet with comfortably warm water.  Bathe for 15-20 minutes, 1-2 times daily.  Pat dry with a towel. Do not rub the rash.  Use hydrocortisone cream.  Take an antihistamine like Benadryl for widespread rashes that itch.  The adult dose of Benadryl is 25-50 mg by mouth 4 times daily.  Caution:  This type of medication may cause sleepiness.  Do not drink alcohol, drive, or operate dangerous machinery while taking antihistamines.  Do not take these medications if you have prostate enlargement.  Read package instructions thoroughly on all medications that you take.  GET HELP RIGHT AWAY IF:   Symptoms don't go away after treatment.  Severe itching that persists.  If you rash spreads or swells.  If you rash begins to smell.  If it blisters and opens or develops a yellow-brown crust.  You develop a fever.  You have a sore throat.  You become short of breath.  MAKE SURE YOU:  Understand these instructions. Will watch your condition. Will get help right away if you  are not doing well or get worse.  Thank you for choosing an e-visit. Your e-visit answers were reviewed by a board certified advanced clinical practitioner to complete your personal care plan. Depending upon the condition, your plan could have included both over the counter or prescription medications. Please review your pharmacy choice. Be sure that the pharmacy you have chosen is open so that you can pick up your prescription now.  If there is a problem you may message your provider in [place] to have the prescription routed to another pharmacy. Your safety is important to [place]. If you have drug allergies check your prescription carefully.  For the next 24 hours, you can use MyChart to ask questions about today's visit, request a non-urgent call back, or ask for a work or school excuse from your e-visit provider. You will get an email in the next two days asking about your experience. I hope that your e-visit has been valuable and will speed your recovery.

## 2015-06-03 NOTE — Addendum Note (Signed)
Addended by: Guadelupe Sabin C on: 06/03/2015 12:10 PM   Modules accepted: Orders, Medications

## 2015-06-06 ENCOUNTER — Encounter: Payer: Self-pay | Admitting: Allergy and Immunology

## 2015-06-06 ENCOUNTER — Ambulatory Visit (INDEPENDENT_AMBULATORY_CARE_PROVIDER_SITE_OTHER): Payer: 59 | Admitting: Allergy and Immunology

## 2015-06-06 VITALS — BP 152/92 | HR 72 | Temp 98.5°F | Resp 16 | Ht 67.13 in | Wt 179.5 lb

## 2015-06-06 DIAGNOSIS — L500 Allergic urticaria: Secondary | ICD-10-CM | POA: Diagnosis not present

## 2015-06-06 MED ORDER — LEVOCETIRIZINE DIHYDROCHLORIDE 5 MG PO TABS: 5.0000 mg | ORAL_TABLET | Freq: Every evening | ORAL | Status: DC

## 2015-06-06 MED ORDER — PREDNISONE 1 MG PO TABS: 10.0000 mg | ORAL_TABLET | ORAL | Status: DC

## 2015-06-06 MED FILL — LEVOCETIRIZINE 5 MG TABLET: 5 | 30 days supply | Qty: 30 | Fill #0

## 2015-06-06 NOTE — Assessment & Plan Note (Signed)
Unclear etiology.  We were unable to perform skin tests today due to recent administration of antihistamine.   The patient is scheduled to return next week for allergy skin testing after having been off of antihistamines for at least 3 days.    Instructions have been provided and discussed for H1/H2 receptor blockade with step-wise increase/decrease to find lowest effective dose.  To control symptoms while off of antihistamines, prednisone has been provided: 10g per day for 3 days.  Further recommendations will be made at that time based upon skin test results. Should significant symptoms or new symptoms occur, a journal is to be kept recording any foods eaten, beverages consumed, medications taken, activities performed, and environmental conditions within a 6 hour time period prior to the onset of symptoms. For any symptoms concerning for anaphylaxis, 911 is to be called immediately.

## 2015-06-06 NOTE — Progress Notes (Signed)
New Patient Note  RE: Jamie Duarte MRN: [id number] DOB: 08-19-1980 Date of Office Visit: 06/06/2015  Referring provider: Gaynelle Arabian, MD Primary care provider: Simona Huh, MD  Chief Complaint: Urticaria   History of present illness: HPI Comments: Jamie Duarte is a 35 y.o. female presenting today for consultation of recurrent hives.  She reports that in November 2014 she experienced recurrent hives which lasted for one or 2 weeks prior to resolution after course of prednisone.  The distribution of hives included her entire body.  Individual hives were described as red, raised, and pruritic resolving within 24 hours without residual pigmentation or bruising.  She denies concomitant cardiopulmonary or GI symptoms.  She had a similar episode in October/November 2016.  Last week she began to develop hives on her face, neck, hands, and upper arms.  She was started on prednisone, hydroxyzine, and fexofenadine.  Gain, she did not experience concomitant cardiopulmonary or GI symptoms.  She is unable to identify any specific medication, food, or environmental trigger, however she notes that she recently started using a supplement called C4 Preworkout is curious if this may be contributing to her symptoms.  She had not been taking this supplement during the previous episodes.   Assessment and plan: Allergic urticaria Unclear etiology.  We were unable to perform skin tests today due to recent administration of antihistamine.   The patient is scheduled to return next week for allergy skin testing after having been off of antihistamines for at least 3 days.    Instructions have been provided and discussed for H1/H2 receptor blockade with step-wise increase/decrease to find lowest effective dose.  To control symptoms while off of antihistamines, prednisone has been provided: 10g per day for 3 days.  Further recommendations will be made at that time based upon skin test  results. Should significant symptoms or new symptoms occur, a journal is to be kept recording any foods eaten, beverages consumed, medications taken, activities performed, and environmental conditions within a 6 hour time period prior to the onset of symptoms. For any symptoms concerning for anaphylaxis, 911 is to be called immediately.     Meds ordered this encounter  Medications  . levocetirizine (XYZAL) 5 MG tablet    Sig: Take 1 tablet (5 mg total) by mouth every evening.    Dispense:  30 tablet    Refill:  5  . predniSONE (DELTASONE) tablet 10 mg    Sig:     Diagnositics: We were unable to perform skin tests today due to recent administration of antihistamine.     Physical examination: Blood pressure 152/92, pulse 72, temperature 98.5 F (36.9 C), temperature source Oral, resp. rate 16, height 5' 7.13" (1.705 m), weight 179 lb 7.3 oz (81.4 kg).  General: Alert, interactive, in no acute distress. Lungs: Clear to auscultation without wheezing, rhonchi or rales. CV: Normal S1, S2 without murmurs. Abdomen: Nondistended, nontender. Skin: Scattered erythematous urticarial type lesions primarily located hands and upper arms as well as posterior neck , nonvesicular. Extremities:  No clubbing, cyanosis or edema. Neuro:   Grossly intact.  Review of systems:  Review of Systems  Constitutional: Negative for fever, chills and weight loss.  HENT: Positive for congestion. Negative for nosebleeds.   Eyes: Negative for blurred vision.  Respiratory: Negative for hemoptysis, shortness of breath and wheezing.   Cardiovascular: Negative for chest pain.  Gastrointestinal: Negative for vomiting, abdominal pain, diarrhea and constipation.  Genitourinary: Negative for dysuria.  Musculoskeletal: Negative for myalgias and  joint pain.  Skin: Positive for itching and rash.  Neurological: Negative for dizziness.  Endo/Heme/Allergies: Positive for environmental allergies. Does not bruise/bleed  easily.    Past medical history:  Past Medical History  Diagnosis Date  . Hypertension   . Ulcerative colitis [facility]     Past surgical history:  Past Surgical History  Procedure Laterality Date  . Cesarean section    . Breast lumpectomy Left 1998    Family history: Family History  Problem Relation Age of Onset  . Allergic rhinitis Neg Hx   . Angioedema Neg Hx   . Asthma Neg Hx   . Atopy Neg Hx   . Eczema Neg Hx   . Urticaria Neg Hx   . Immunodeficiency Neg Hx     Social history: Social History   Social History  . Marital Status: Single    Spouse Name: N/A  . Number of Children: N/A  . Years of Education: N/A   Occupational History  . Not on file.   Social History Main Topics  . Smoking status: Never Smoker   . Smokeless tobacco: Not on file  . Alcohol Use: Not on file  . Drug Use: Not on file  . Sexual Activity: Not on file   Other Topics Concern  . Not on file   Social History Narrative    Environmental History: The patient lives in a 37 year old house with carpeting throughout, gas heat, and central air.  She is a nonsmoker without pets.    Medication List       This list is accurate as of: 06/06/15  3:12 PM.  Always use your most recent med list.               fexofenadine 180 MG tablet  Commonly known as:  ALLEGRA  Take 180 mg by mouth daily.     HUMIRA PEN 40 MG/0.8ML Pnkt  Generic drug:  Adalimumab     hydrochlorothiazide 25 MG tablet  Commonly known as:  HYDRODIURIL  Take 25 mg by mouth daily. Reported on 06/06/2015     hydrOXYzine 25 MG tablet  Commonly known as:  ATARAX/VISTARIL  Take 25 mg by mouth at bedtime.     levocetirizine 5 MG tablet  Commonly known as:  XYZAL  Take 1 tablet (5 mg total) by mouth every evening.     LIALDA 1.2 g EC tablet  Generic drug:  mesalamine     metoprolol 50 MG tablet  Commonly known as:  LOPRESSOR  Take 50 mg by mouth 2 (two) times daily.     norethindrone 0.35 MG tablet  Commonly known  as:  MICRONOR,CAMILA,ERRIN     predniSONE 10 MG (21) Tbpk tablet  Commonly known as:  STERAPRED UNI-PAK 21 TAB  Take 1 tablet (10 mg total) by mouth as directed. Dose taper as directed (21 pack)     ursodiol 500 MG tablet  Commonly known as:  ACTIGALL  Take 500 mg by mouth 3 (three) times daily.        Known medication allergies: No Known Allergies  I appreciate the opportunity to take part in this Nia's care. Please do not hesitate to contact me with questions.  Sincerely,   R. Edgar Frisk, MD

## 2015-06-06 NOTE — Patient Instructions (Addendum)
Allergic urticaria Unclear etiology.  We were unable to perform skin tests today due to recent administration of antihistamine.   The patient is scheduled to return next week for allergy skin testing after having been off of antihistamines for at least 3 days.    Instructions have been provided and discussed for H1/H2 receptor blockade with step-wise increase/decrease to find lowest effective dose.  To control symptoms while off of antihistamines, prednisone has been provided: 10g per day for 3 days.  Further recommendations will be made at that time based upon skin test results. Should significant symptoms or new symptoms occur, a journal is to be kept recording any foods eaten, beverages consumed, medications taken, activities performed, and environmental conditions within a 6 hour time period prior to the onset of symptoms. For any symptoms concerning for anaphylaxis, 911 is to be called immediately.     Return in about 1 week (around 06/13/2015) for allergy skin testing.   Urticaria (Hives)  . Levocetirizine (Xyzal) 5 mg in morning and Cetirizine (Zyrtec) 10mg  at night and ranitidine (Zantac) 150 mg twice a day. If no symptoms for 7-14 days then decrease to. . Levocetirizine (Xyzal) 5 mg in morning and Cetirizine (Zyrtec) 10mg  at night and ranitidine (Zantac) 150 mg once a day.  If no symptoms for 7-14 days then decrease to. . Levocetirizine (Xyzal) 5 mg in morning and Cetirizine (Zyrtec) 10mg  at night.  If no symptoms for 7-14 days then decrease to. . Levocetirizine (Xyzal) 5 mg once a day.  May use Benadryl (diphenhydramine) as needed for breakthrough symptoms       If symptoms return, then step up dosage  May take prednisone 10mg  per day x 3 days as a bridge while off of antihistamines in preparation for allergy skin test.

## 2015-06-07 ENCOUNTER — Other Ambulatory Visit: Payer: Self-pay

## 2015-06-07 ENCOUNTER — Telehealth: Payer: Self-pay

## 2015-06-07 MED ORDER — RANITIDINE HCL 150 MG PO TABS: 150.0000 mg | ORAL_TABLET | Freq: Two times a day (BID) | ORAL | Status: DC

## 2015-06-07 MED FILL — raNITIdine HCL 150 MG TABS: 150 | 30 days supply | Qty: 60 | Fill #0

## 2015-06-07 NOTE — Telephone Encounter (Signed)
DO YOU OTC OR RX FOR THE ZANTAC?

## 2015-06-07 NOTE — Telephone Encounter (Signed)
Patient was seen on yesterday by Dr. Verlin Fester and she went to the pharmacy to pick up her medications. She is wondering is the Zantac) 150 mg supposed to be a prescription. The pharmacy told her they only have the 150 mg as prescription strength only.  Please Advise

## 2015-06-07 NOTE — Telephone Encounter (Signed)
Sent in rx for ranitidine

## 2015-06-07 NOTE — Telephone Encounter (Signed)
Sure, we can put ina script for rantidine 150 mg bid prn. Thanks.

## 2015-06-11 MED FILL — LIALDA 1.2 GM TABLET SA: 1.2 | 30 days supply | Qty: 120 | Fill #3

## 2015-06-13 ENCOUNTER — Ambulatory Visit (INDEPENDENT_AMBULATORY_CARE_PROVIDER_SITE_OTHER): Payer: 59 | Admitting: Allergy and Immunology

## 2015-06-13 ENCOUNTER — Encounter: Payer: Self-pay | Admitting: Allergy and Immunology

## 2015-06-13 VITALS — BP 110/66 | HR 76 | Resp 16

## 2015-06-13 DIAGNOSIS — K2970 Gastritis, unspecified, without bleeding: Secondary | ICD-10-CM

## 2015-06-13 DIAGNOSIS — L500 Allergic urticaria: Secondary | ICD-10-CM | POA: Diagnosis not present

## 2015-06-13 NOTE — Assessment & Plan Note (Signed)
Uncertain etiology. Skin tests to select food allergens were negative today. NSAIDs and emotional stress commonly exacerbate urticaria but are not the underlying etiology in this case. Physical urticarias are negative by history (i.e. pressure-induced, temperature, vibration, solar, etc.). There are no concomitant symptoms concerning for anaphylaxis or constitutional symptoms worrisome for an underlying malignancy. We will rule out other potential etiologies with labs. For symptom relief, patient is to take oral antihistamines as directed.  The following labs have been ordered: FCeRI antibody, TSH, anti-thyroglobulin antibody, thyroid peroxidase antibody, tryptase, urea breath test, and galactose-alpha-1,3-galactose IgE level.  The patient will be called with further recommendations after lab results have returned.  Continue H1/H2 receptor blockade, titrating to the lowest effective dose necessary to suppress urticaria.  A journal is to be kept recording any foods eaten, beverages consumed, medications taken within a 6 hour period prior to the onset of symptoms, as well as record activities being performed, and environmental conditions. For any symptoms concerning for anaphylaxis, 911 is to be called immediately.

## 2015-06-13 NOTE — Patient Instructions (Addendum)
Urticaria Uncertain etiology. Skin tests to select food allergens were negative today. NSAIDs and emotional stress commonly exacerbate urticaria but are not the underlying etiology in this case. Physical urticarias are negative by history (i.e. pressure-induced, temperature, vibration, solar, etc.). There are no concomitant symptoms concerning for anaphylaxis or constitutional symptoms worrisome for an underlying malignancy. We will rule out other potential etiologies with labs. For symptom relief, patient is to take oral antihistamines as directed.  The following labs have been ordered: FCeRI antibody, TSH, anti-thyroglobulin antibody, thyroid peroxidase antibody, tryptase, urea breath test, and galactose-alpha-1,3-galactose IgE level.  The patient will be called with further recommendations after lab results have returned.  Continue H1/H2 receptor blockade, titrating to the lowest effective dose necessary to suppress urticaria.  A journal is to be kept recording any foods eaten, beverages consumed, medications taken within a 6 hour period prior to the onset of symptoms, as well as record activities being performed, and environmental conditions. For any symptoms concerning for anaphylaxis, 911 is to be called immediately.    Return When lab results have returned the patient will be called with further recommendations and follow up.  Urticaria (Hives)  . Levocetirizine (Xyzal) 5 mg in morning and Cetirizine (Zyrtec) 10mg  at night and ranitidine (Zantac) 150 mg twice a day. If no symptoms for 7-14 days then decrease to. . Levocetirizine (Xyzal) 5 mg in morning and Cetirizine (Zyrtec) 10mg  at night and ranitidine (Zantac) 150 mg once a day.  If no symptoms for 7-14 days then decrease to. . Levocetirizine (Xyzal) 5 mg in morning and Cetirizine (Zyrtec) 10mg  at night.  If no symptoms for 7-14 days then decrease to. . Levocetirizine (Xyzal) 5 mg once a day.  May use Benadryl (diphenhydramine) as  needed for breakthrough symptoms       If symptoms return, then step up dosage

## 2015-06-13 NOTE — Progress Notes (Signed)
Follow-up Note  RE: Jamie Duarte MRN: [id number] DOB: [birth year] Date of Office Visit: 06/13/2015  Primary care provider: Simona Huh, MD Referring provider: Gaynelle Arabian, MD  History of present illness: HPI Comments: Jamie Duarte is a 35 y.o. female with urticaria presenting today for allergy skin testing.  We were unable to proceed with skin testing during her initial evaluation due to recent administration of antihistamine.  She has been off of antihistamines for least 3 days in anticipation of today's testing.  Please see history of present illness from 06/06/2015 for details.   Assessment and plan: Urticaria Uncertain etiology. Skin tests to select food allergens were negative today. NSAIDs and emotional stress commonly exacerbate urticaria but are not the underlying etiology in this case. Physical urticarias are negative by history (i.e. pressure-induced, temperature, vibration, solar, etc.). There are no concomitant symptoms concerning for anaphylaxis or constitutional symptoms worrisome for an underlying malignancy. We will rule out other potential etiologies with labs. For symptom relief, patient is to take oral antihistamines as directed.  The following labs have been ordered: FCeRI antibody, TSH, anti-thyroglobulin antibody, thyroid peroxidase antibody, tryptase, urea breath test, and galactose-alpha-1,3-galactose IgE level.  The patient will be called with further recommendations after lab results have returned.  Continue H1/H2 receptor blockade, titrating to the lowest effective dose necessary to suppress urticaria.  A journal is to be kept recording any foods eaten, beverages consumed, medications taken within a 6 hour period prior to the onset of symptoms, as well as record activities being performed, and environmental conditions. For any symptoms concerning for anaphylaxis, 911 is to be called immediately.   Diagnositics: Environmental skin tests:  Negative despite a positive histamine control. Food allergen skin tests: Negative despite a positive histamine control.    Physical examination: Blood pressure 110/66, pulse 76, resp. rate 16.  General: Alert, interactive, in no acute distress. HEENT: TMs pearly gray, turbinates mildly edematous without discharge, post-pharynx mildly erythematous. Neck: Supple without lymphadenopathy. Lungs: Clear to auscultation without wheezing, rhonchi or rales. CV: Normal S1, S2 without murmurs. Skin: Warm and dry, without lesions or rashes.  The following portions of the patient's history were reviewed and updated as appropriate: allergies, current medications, past family history, past medical history, past social history, past surgical history and problem list.    Medication List       This list is accurate as of: 06/13/15  1:27 PM.  Always use your most recent med list.               fexofenadine 180 MG tablet  Commonly known as:  ALLEGRA  Take 180 mg by mouth daily. Reported on 06/13/2015     HUMIRA PEN 40 MG/0.8ML Pnkt  Generic drug:  Adalimumab     hydrochlorothiazide 25 MG tablet  Commonly known as:  HYDRODIURIL  Take 25 mg by mouth daily. Reported on 06/06/2015     hydrOXYzine 25 MG tablet  Commonly known as:  ATARAX/VISTARIL  Take 25 mg by mouth at bedtime.     levocetirizine 5 MG tablet  Commonly known as:  XYZAL  Take 1 tablet (5 mg total) by mouth every evening.     LIALDA 1.2 g EC tablet  Generic drug:  mesalamine     metoprolol 50 MG tablet  Commonly known as:  LOPRESSOR  Take 50 mg by mouth 2 (two) times daily.     norethindrone 0.35 MG tablet  Commonly known as:  MICRONOR,CAMILA,ERRIN     predniSONE 10  MG (21) Tbpk tablet  Commonly known as:  STERAPRED UNI-PAK 21 TAB  Take 1 tablet (10 mg total) by mouth as directed. Dose taper as directed (21 pack)     ranitidine 150 MG tablet  Commonly known as:  ZANTAC  Take 1 tablet (150 mg total) by mouth 2 (two)  times daily.     ursodiol 500 MG tablet  Commonly known as:  ACTIGALL  Take 500 mg by mouth 3 (three) times daily.        No Known Allergies  I appreciate the opportunity to take part in this Marylyn's care. Please do not hesitate to contact me with questions.  Sincerely,   R. Edgar Frisk, MD

## 2015-06-14 LAB — H. PYLORI BREATH TEST: H. pylori Breath Test: NOT DETECTED

## 2015-06-15 LAB — TRYPTASE: Tryptase: 5.8 ug/L (ref <11)

## 2015-06-15 LAB — ALPHA-GAL PANEL
Allergen, Mutton, f88: <0.1 kU/L
Allergen, Pork, f26: <0.1 kU/L
Beef: <0.1 kU/L
Galactose-alpha-1,3-galactose IgE: <0.1 kU/L (ref <0.35)

## 2015-06-25 LAB — CP CHRONIC URTICARIA INDEX PANEL
Histamine Release: <16 % (ref <16)
TSH: 0.82 mIU/L
Thyroglobulin Ab: 1 IU/mL (ref <2)
Thyroperoxidase Ab SerPl-aCnc: 2 IU/mL (ref <9)

## 2015-06-26 ENCOUNTER — Ambulatory Visit: Payer: Self-pay | Admitting: Allergy and Immunology

## 2015-06-26 MED FILL — HUMIRA PEN 40 MG/0.8ML PNKT: 40 | 28 days supply | Qty: 2 | Fill #8

## 2015-06-27 MED FILL — METOPROLOL SUCC ER 100 MG T: 100 | 90 days supply | Qty: 90 | Fill #0

## 2015-07-09 MED FILL — URSODIOL 500 MG TABLET: 500 | 30 days supply | Qty: 60 | Fill #4

## 2015-07-10 MED FILL — NORETHINDRONE 0.35 MG TAB: 0.35 | 28 days supply | Qty: 28 | Fill #0

## 2015-07-22 MED FILL — LEVOCETIRIZINE 5 MG TABLET: 5 | 30 days supply | Qty: 30 | Fill #1

## 2015-07-22 MED FILL — raNITIdine HCL 150 MG TABS: 150 | 30 days supply | Qty: 60 | Fill #1

## 2015-07-22 MED FILL — HUMIRA PEN 40 MG/0.8ML PNKT: 40 | 28 days supply | Qty: 4 | Fill #1

## 2015-08-02 MED FILL — LIALDA 1.2 GM TABLET SA: 1.2 | 30 days supply | Qty: 120 | Fill #4

## 2015-08-09 MED FILL — NORETHINDRONE 0.35 MG TAB: 0.35 | 28 days supply | Qty: 28 | Fill #1

## 2015-08-12 MED FILL — URSODIOL 500 MG TABLET: 500 | 30 days supply | Qty: 60 | Fill #5

## 2015-08-22 ENCOUNTER — Ambulatory Visit (HOSPITAL_BASED_OUTPATIENT_CLINIC_OR_DEPARTMENT_OTHER): Payer: 59 | Admitting: Pharmacist

## 2015-08-22 DIAGNOSIS — K5190 Ulcerative colitis, unspecified, without complications: Secondary | ICD-10-CM

## 2015-08-23 MED ORDER — HUMIRA PEN 40 MG/0.8ML ~~LOC~~ PNKT: 40.0000 mg | PEN_INJECTOR | SUBCUTANEOUS | Status: DC

## 2015-08-23 NOTE — Progress Notes (Addendum)
S: Patient presents today to the [facility].  Patient is currently taking Humira for ulcerative colitis. Patient is managed by Dr. Collene Mares for this.   Patient reports that she thinks she is responding well to the Humira. She had a colonoscopy which was improved but there was still room for improvement to control her disease so Dr. Collene Mares increased Humira to weekly. She is due for the follow up colonoscopy next month.   Adherence: denies any missed doses and reports rotating sites of injection.  Dosing:  Ulcerative colitis: SubQ (may continue aminosalicylates and/or corticosteroids; if necessary, azathioprine, or mercaptopurine may also be continued). Maintenance: 40 mg every other week beginning day 29.   Drug-drug interactions: H2 blockers and Lialda should not be used at the same time to prevent premature release of Lialda.   Screening: TB test: completed per patient Hepatitis: completed per patient  Monitoring: S/sx of infection: denies CBC: last one on file is from a year ago, she reports her last CBC was improved and she expects to get another one next month.  S/sx of hypersensitivity: denies S/sx of malignancy: denies S/sx of heart failure: denies    O:     Lab Results  Component Value Date   WBC 3.4* 07/13/2014   HGB 9.9* 07/13/2014   HCT 30.3* 07/13/2014   MCV 94.4 07/13/2014   PLT 315 07/13/2014      Chemistry   No results found for: NA, K, CL, CO2, BUN, CREATININE, GLU No results found for: CALCIUM, ALKPHOS, AST, ALT, BILITOT     A/P: 1. Medication review: patient on Humira and tolerating it well with no adverse effects and control of her UC. Reviewed the medication with her, including the following: Humira is a TNF blocking agent indicated for ankylosing spondylitis, Crohn's disease, Hidradenitis suppurativa, psoriatic arthritis, plaque psoriasis, ulcerative colitis, and uveitis. The most common adverse effects are  infections, headache, and injection site reactions. There is the possibility of an increased risk of malignancy but it is not well understood if this increased risk is due to there medication or the disease state. There are rare cases of pancytopenia and aplastic anemia. No recommendations for changes. Will request most recent lab work from Dr. Lorie Apley office.     Nicoletta Ba, PharmD, BCPS, [facility] [phone]  Evaluation and management procedures were performed by the Clinical Pharmacy Practitioner under my supervision and collaboration. I have reviewed the Practitioner's note and chart, and I agree with the management and plan as documented above.   Angelica Chessman, MD, [place], [place], [place], [place] and [place], [place]   08/23/2015, 2:36 PM

## 2015-08-27 DIAGNOSIS — Z01419 Encounter for gynecological examination (general) (routine) without abnormal findings: Secondary | ICD-10-CM | POA: Diagnosis not present

## 2015-08-27 DIAGNOSIS — Z6828 Body mass index (BMI) 28.0-28.9, adult: Secondary | ICD-10-CM | POA: Diagnosis not present

## 2015-08-27 DIAGNOSIS — R87810 Cervical high risk human papillomavirus (HPV) DNA test positive: Secondary | ICD-10-CM | POA: Diagnosis not present

## 2015-08-27 DIAGNOSIS — Z1151 Encounter for screening for human papillomavirus (HPV): Secondary | ICD-10-CM | POA: Diagnosis not present

## 2015-08-27 MED FILL — NORETHINDRONE 0.35 MG TAB: 0.35 | 84 days supply | Qty: 84 | Fill #0

## 2015-09-03 MED FILL — LEVOCETIRIZINE 5 MG TABLET: 5 | 30 days supply | Qty: 30 | Fill #2

## 2015-09-04 DIAGNOSIS — K743 Primary biliary cirrhosis: Secondary | ICD-10-CM | POA: Diagnosis not present

## 2015-09-04 DIAGNOSIS — K5190 Ulcerative colitis, unspecified, without complications: Secondary | ICD-10-CM | POA: Diagnosis not present

## 2015-09-04 DIAGNOSIS — Z1211 Encounter for screening for malignant neoplasm of colon: Secondary | ICD-10-CM | POA: Diagnosis not present

## 2015-09-05 DIAGNOSIS — M7071 Other bursitis of hip, right hip: Secondary | ICD-10-CM | POA: Diagnosis not present

## 2015-09-05 DIAGNOSIS — M25561 Pain in right knee: Secondary | ICD-10-CM | POA: Diagnosis not present

## 2015-09-05 DIAGNOSIS — M2550 Pain in unspecified joint: Secondary | ICD-10-CM | POA: Diagnosis not present

## 2015-09-05 DIAGNOSIS — M25571 Pain in right ankle and joints of right foot: Secondary | ICD-10-CM | POA: Diagnosis not present

## 2015-09-05 DIAGNOSIS — R899 Unspecified abnormal finding in specimens from other organs, systems and tissues: Secondary | ICD-10-CM | POA: Diagnosis not present

## 2015-09-05 DIAGNOSIS — R300 Dysuria: Secondary | ICD-10-CM | POA: Diagnosis not present

## 2015-09-05 DIAGNOSIS — Z79899 Other long term (current) drug therapy: Secondary | ICD-10-CM | POA: Diagnosis not present

## 2015-09-19 MED FILL — URSODIOL 500 MG TABLET: 500 | 30 days supply | Qty: 60 | Fill #6

## 2015-09-19 MED FILL — METOPROLOL SUCC ER 100 MG T: 100 | 90 days supply | Qty: 90 | Fill #0

## 2015-09-19 MED FILL — MESALAMINE DR 1.2 GM TABLET: 1.2 | 30 days supply | Qty: 120 | Fill #5

## 2015-09-19 MED FILL — raNITIdine HCL 150 MG TABS: 150 | 30 days supply | Qty: 60 | Fill #2

## 2015-09-24 DIAGNOSIS — R87810 Cervical high risk human papillomavirus (HPV) DNA test positive: Secondary | ICD-10-CM | POA: Diagnosis not present

## 2015-09-24 DIAGNOSIS — N870 Mild cervical dysplasia: Secondary | ICD-10-CM | POA: Diagnosis not present

## 2015-09-28 MED FILL — HUMIRA PEN 40 MG/0.8ML PNKT: 40 | 28 days supply | Qty: 4 | Fill #0

## 2015-10-03 MED FILL — GAVILYTE-G SOLUTION: 236 | 1 days supply | Qty: 4000 | Fill #0

## 2015-10-08 DIAGNOSIS — K5190 Ulcerative colitis, unspecified, without complications: Secondary | ICD-10-CM | POA: Diagnosis not present

## 2015-10-08 DIAGNOSIS — K6389 Other specified diseases of intestine: Secondary | ICD-10-CM | POA: Diagnosis not present

## 2015-10-08 DIAGNOSIS — K529 Noninfective gastroenteritis and colitis, unspecified: Secondary | ICD-10-CM | POA: Diagnosis not present

## 2015-10-08 DIAGNOSIS — Z1211 Encounter for screening for malignant neoplasm of colon: Secondary | ICD-10-CM | POA: Diagnosis not present

## 2015-10-19 MED FILL — LEVOCETIRIZINE 5 MG TABLET: 5 | 30 days supply | Qty: 30 | Fill #3

## 2015-10-19 MED FILL — URSODIOL 500 MG TABLET: 500 | 30 days supply | Qty: 60 | Fill #7

## 2015-11-13 DIAGNOSIS — Z111 Encounter for screening for respiratory tuberculosis: Secondary | ICD-10-CM | POA: Diagnosis not present

## 2015-11-21 MED FILL — HEATHER TABLET: 0.35 | 84 days supply | Qty: 84 | Fill #1

## 2015-11-21 MED FILL — LEVOCETIRIZINE 5 MG TABLET: 5 | 30 days supply | Qty: 30 | Fill #4

## 2015-11-21 MED FILL — HUMIRA PEN 40 MG/0.8ML PNKT: 40 | 28 days supply | Qty: 4 | Fill #1

## 2015-11-21 MED FILL — URSODIOL 500 MG TABLET: 500 | 30 days supply | Qty: 60 | Fill #0

## 2015-11-21 MED FILL — FOLIC ACID 1 MG TABLET: 1 | 90 days supply | Qty: 90 | Fill #0

## 2015-11-23 MED FILL — MESALAMINE DR 1.2 GM TABLET: 1.2 | 30 days supply | Qty: 120 | Fill #0

## 2015-12-31 MED FILL — URSODIOL 500 MG TABLET: 500 | 30 days supply | Qty: 60 | Fill #1

## 2015-12-31 MED FILL — HUMIRA PEN 40 MG/0.8ML PNKT: 40 | 28 days supply | Qty: 4 | Fill #2

## 2015-12-31 MED FILL — MESALAMINE DR 1.2G TABLET: 1.2 | 30 days supply | Qty: 120 | Fill #1

## 2015-12-31 MED FILL — METOPROLOL SUCC ER 100 MG T: 100 | 90 days supply | Qty: 90 | Fill #0

## 2016-01-04 DIAGNOSIS — M359 Systemic involvement of connective tissue, unspecified: Secondary | ICD-10-CM | POA: Diagnosis not present

## 2016-01-04 DIAGNOSIS — K5190 Ulcerative colitis, unspecified, without complications: Secondary | ICD-10-CM | POA: Diagnosis not present

## 2016-01-04 DIAGNOSIS — I10 Essential (primary) hypertension: Secondary | ICD-10-CM | POA: Diagnosis not present

## 2016-01-04 DIAGNOSIS — Z0000 Encounter for general adult medical examination without abnormal findings: Secondary | ICD-10-CM | POA: Diagnosis not present

## 2016-01-17 DIAGNOSIS — K5190 Ulcerative colitis, unspecified, without complications: Secondary | ICD-10-CM | POA: Diagnosis not present

## 2016-01-17 DIAGNOSIS — D509 Iron deficiency anemia, unspecified: Secondary | ICD-10-CM | POA: Diagnosis not present

## 2016-01-17 MED FILL — LEVOCETIRIZINE 5 MG TABLET: 5 | 30 days supply | Qty: 30 | Fill #5

## 2016-02-05 MED FILL — URSODIOL 500 MG TABLET: 500 | 30 days supply | Qty: 60 | Fill #2

## 2016-02-13 MED FILL — GAVILYTE-G SOLUTION: 236 | 1 days supply | Qty: 4000 | Fill #0

## 2016-02-15 DIAGNOSIS — K5190 Ulcerative colitis, unspecified, without complications: Secondary | ICD-10-CM | POA: Diagnosis not present

## 2016-02-15 DIAGNOSIS — Z1211 Encounter for screening for malignant neoplasm of colon: Secondary | ICD-10-CM | POA: Diagnosis not present

## 2016-02-15 DIAGNOSIS — K529 Noninfective gastroenteritis and colitis, unspecified: Secondary | ICD-10-CM | POA: Diagnosis not present

## 2016-02-19 MED FILL — LIALDA 1.2 GM TABLET SA: 1.2 | 30 days supply | Qty: 120 | Fill #2

## 2016-02-19 MED FILL — NORETHINDRONE 0.35 MG TAB: 0.35 | 84 days supply | Qty: 84 | Fill #2

## 2016-02-19 MED FILL — HUMIRA PEN 40 MG/0.8ML PNKT: 40 | 28 days supply | Qty: 4 | Fill #3

## 2016-03-04 DIAGNOSIS — M7062 Trochanteric bursitis, left hip: Secondary | ICD-10-CM | POA: Insufficient documentation

## 2016-03-04 DIAGNOSIS — Z8679 Personal history of other diseases of the circulatory system: Secondary | ICD-10-CM | POA: Insufficient documentation

## 2016-03-04 DIAGNOSIS — K5190 Ulcerative colitis, unspecified, without complications: Secondary | ICD-10-CM | POA: Insufficient documentation

## 2016-03-04 DIAGNOSIS — M7061 Trochanteric bursitis, right hip: Secondary | ICD-10-CM | POA: Insufficient documentation

## 2016-03-04 DIAGNOSIS — Z79899 Other long term (current) drug therapy: Secondary | ICD-10-CM | POA: Insufficient documentation

## 2016-03-04 DIAGNOSIS — R768 Other specified abnormal immunological findings in serum: Secondary | ICD-10-CM | POA: Insufficient documentation

## 2016-03-04 DIAGNOSIS — M359 Systemic involvement of connective tissue, unspecified: Secondary | ICD-10-CM | POA: Insufficient documentation

## 2016-03-04 DIAGNOSIS — R760 Raised antibody titer: Secondary | ICD-10-CM | POA: Insufficient documentation

## 2016-03-04 DIAGNOSIS — Z8639 Personal history of other endocrine, nutritional and metabolic disease: Secondary | ICD-10-CM | POA: Insufficient documentation

## 2016-03-04 NOTE — Progress Notes (Deleted)
Office Visit Note  Patient: Jamie Duarte             Date of Birth: [birth year]           MRN: [id number]             PCP: Simona Huh, MD Referring: Gaynelle Arabian, MD Visit Date: 03/05/2016 Occupation: @GUAROCC @    Subjective:  No chief complaint on file.   History of Present Illness: Jamie Duarte is a 36 y.o. female ***   Activities of Daily Living:  Patient reports morning stiffness for *** {minute/hour:19697}.   Patient {ACTIONS;DENIES/REPORTS:21021675::"Denies"} nocturnal pain.  Difficulty dressing/grooming: {ACTIONS;DENIES/REPORTS:21021675::"Denies"} Difficulty climbing stairs: {ACTIONS;DENIES/REPORTS:21021675::"Denies"} Difficulty getting out of chair: {ACTIONS;DENIES/REPORTS:21021675::"Denies"} Difficulty using hands for taps, buttons, cutlery, and/or writing: {ACTIONS;DENIES/REPORTS:21021675::"Denies"}   No Rheumatology ROS completed.   PMFS History:  Patient Active Problem List   Diagnosis Date Noted  . Ulcerative colitis ([place]) 03/04/2016  . Trochanteric bursitis of both hips 03/04/2016  . ANA positive 03/04/2016  . 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 High risk medication use 03/04/2016  . Urticaria 06/06/2015    Past Medical History:  Diagnosis Date  . Hypertension   . Ulcerative colitis ([place])     Family History  Problem Relation Age of Onset  . Allergic rhinitis Neg Hx   . Angioedema Neg Hx   . Asthma Neg Hx   . Atopy Neg Hx   . Eczema Neg Hx   . Urticaria Neg Hx   . Immunodeficiency Neg Hx    Past Surgical History:  Procedure Laterality Date  . BREAST LUMPECTOMY Left 1998  . CESAREAN SECTION     Social History   Social History Narrative  . No narrative on file     Objective: Vital Signs: There were no vitals taken for this visit.   Physical Exam   Musculoskeletal Exam: ***  CDAI Exam: No CDAI exam completed.    Investigation: Findings:  Her labs from July of 2009 revealed CK and TSH were normal.   C3 and C4 were normal.  Rheumatoid factor and anti-CCP antibodies were negative.  Her ANA was 1:80 nuclear speckled pattern. She has had positive RO and LA antibodies in the past.  She also had beta 2 GP1 IgA antibody borderline positive.  Anti-Cardiolipin antibody was negative.   As regards to patient's autoimmune disease, she has history of positive ANA, Ro, La, history of positive anticardiolipin, although recently anticardiolipin has been normal.       Imaging: No results found.  Speciality Comments: No specialty comments available.    Procedures:  No procedures performed Allergies: Patient has no known allergies.   Assessment / Plan:     Visit Diagnoses: Other ulcerative colitis without complication ([place])  Trochanteric bursitis of both hips  ANA positive - Positive ANA, positive Ro and La antibodies, and positive anticardiolipin antibodies, but she is asymptomatic without any features of autoimmune disease.    Anticardiolipin antibody positive  High risk medication use - Humira / Dr Collene Mares     Orders: No orders of the defined types were placed in this encounter.  No orders of the defined types were placed in this encounter.   Face-to-face time spent with patient was *** minutes. 50% of time was spent in counseling and coordination of care.  Follow-Up Instructions: No Follow-up on file.   Amy Littrell, RT  Note - This record has been created using [employer].  Chart creation errors have been sought, but may not always  have been located. Such creation errors do not reflect on  the standard of medical care.

## 2016-03-05 ENCOUNTER — Ambulatory Visit: Payer: 59 | Admitting: Rheumatology

## 2016-03-26 ENCOUNTER — Other Ambulatory Visit: Payer: Self-pay | Admitting: Pharmacist

## 2016-03-26 MED ORDER — HUMIRA PEN 40 MG/0.8ML ~~LOC~~ PNKT: 40.0000 mg | PEN_INJECTOR | SUBCUTANEOUS | 4 refills | Status: DC

## 2016-03-26 MED FILL — LIALDA 1.2 GM TABLET SA: 1.2 | 30 days supply | Qty: 120 | Fill #3

## 2016-03-26 MED FILL — METOPROLOL SUCC ER 100 MG T: 100 | 90 days supply | Qty: 90 | Fill #0

## 2016-03-26 MED FILL — URSODIOL 500 MG TABLET: 500 | 30 days supply | Qty: 60 | Fill #3

## 2016-03-26 MED FILL — HUMIRA PEN 40 MG/0.8ML PNKT: 40 | 28 days supply | Qty: 4 | Fill #0

## 2016-05-01 MED FILL — NORETHINDRONE 0.35 MG TAB: 0.35 | 84 days supply | Qty: 84 | Fill #3

## 2016-05-01 MED FILL — URSODIOL 500 MG TABLET: 500 | 30 days supply | Qty: 60 | Fill #4

## 2016-05-01 MED FILL — HUMIRA PEN 40 MG/0.8ML PNKT: 40 | 28 days supply | Qty: 4 | Fill #1

## 2016-05-06 DIAGNOSIS — E7800 Pure hypercholesterolemia, unspecified: Secondary | ICD-10-CM | POA: Diagnosis not present

## 2016-05-19 DIAGNOSIS — H5213 Myopia, bilateral: Secondary | ICD-10-CM | POA: Diagnosis not present

## 2016-05-28 DIAGNOSIS — R194 Change in bowel habit: Secondary | ICD-10-CM | POA: Diagnosis not present

## 2016-05-28 DIAGNOSIS — K5190 Ulcerative colitis, unspecified, without complications: Secondary | ICD-10-CM | POA: Diagnosis not present

## 2016-05-28 DIAGNOSIS — R634 Abnormal weight loss: Secondary | ICD-10-CM | POA: Diagnosis not present

## 2016-05-28 DIAGNOSIS — R197 Diarrhea, unspecified: Secondary | ICD-10-CM | POA: Diagnosis not present

## 2016-06-06 MED FILL — FOLIC ACID 1 MG TABLET: 1 | 90 days supply | Qty: 90 | Fill #1

## 2016-06-06 MED FILL — predniSONE 10 MG TABS: 10 | 16 days supply | Qty: 40 | Fill #0

## 2016-06-06 MED FILL — URSODIOL 500 MG TABLET: 500 | 30 days supply | Qty: 60 | Fill #5

## 2016-06-20 ENCOUNTER — Other Ambulatory Visit: Payer: Self-pay | Admitting: Pharmacist

## 2016-06-20 MED ORDER — HUMIRA PEN 40 MG/0.8ML ~~LOC~~ PNKT: 1.0000 "pen " | PEN_INJECTOR | SUBCUTANEOUS | 12 refills | Status: DC

## 2016-06-24 MED FILL — predniSONE 10 MG TABS: 10 | 31 days supply | Qty: 30 | Fill #0

## 2016-06-26 MED FILL — VSL#3 DS MEDICAL FOOD PKT: 10 days supply | Qty: 20 | Fill #0

## 2016-07-10 MED FILL — HUMIRA PEN 40 MG/0.8ML PNKT: 40 | 28 days supply | Qty: 2 | Fill #0

## 2016-07-10 MED FILL — METOPROLOL SUCC ER 100 MG T: 100 | 90 days supply | Qty: 90 | Fill #1

## 2016-07-10 MED FILL — URSODIOL 500 MG TABLET: 500 | 30 days supply | Qty: 60 | Fill #6

## 2016-08-11 MED FILL — URSODIOL 500 MG TABLET: 500 | 30 days supply | Qty: 60 | Fill #7

## 2016-08-11 MED FILL — LIALDA 1.2 GM TABLET SA: 1.2 | 30 days supply | Qty: 120 | Fill #4

## 2016-08-12 MED FILL — NORETHINDRONE 0.35 MG TAB: 0.35 | 28 days supply | Qty: 28 | Fill #0

## 2016-08-12 MED FILL — HUMIRA PEN 40 MG/0.8ML PNKT: 40 | 28 days supply | Qty: 2 | Fill #1

## 2016-09-02 DIAGNOSIS — N870 Mild cervical dysplasia: Secondary | ICD-10-CM | POA: Diagnosis not present

## 2016-09-02 DIAGNOSIS — Z6824 Body mass index (BMI) 24.0-24.9, adult: Secondary | ICD-10-CM | POA: Diagnosis not present

## 2016-09-02 DIAGNOSIS — Z01419 Encounter for gynecological examination (general) (routine) without abnormal findings: Secondary | ICD-10-CM | POA: Diagnosis not present

## 2016-09-02 DIAGNOSIS — R87610 Atypical squamous cells of undetermined significance on cytologic smear of cervix (ASC-US): Secondary | ICD-10-CM | POA: Diagnosis not present

## 2016-09-02 DIAGNOSIS — Z1151 Encounter for screening for human papillomavirus (HPV): Secondary | ICD-10-CM | POA: Diagnosis not present

## 2016-09-04 DIAGNOSIS — N6332 Unspecified lump in axillary tail of the left breast: Secondary | ICD-10-CM | POA: Diagnosis not present

## 2016-09-04 DIAGNOSIS — Z803 Family history of malignant neoplasm of breast: Secondary | ICD-10-CM | POA: Diagnosis not present

## 2016-09-08 ENCOUNTER — Other Ambulatory Visit: Payer: Self-pay | Admitting: Radiology

## 2016-09-08 DIAGNOSIS — R590 Localized enlarged lymph nodes: Secondary | ICD-10-CM | POA: Diagnosis not present

## 2016-09-08 DIAGNOSIS — R599 Enlarged lymph nodes, unspecified: Secondary | ICD-10-CM | POA: Diagnosis not present

## 2016-09-08 MED FILL — NORETHINDRONE 0.35 MG TAB: 0.35 | 84 days supply | Qty: 84 | Fill #0

## 2016-09-09 ENCOUNTER — Other Ambulatory Visit (HOSPITAL_COMMUNITY)
Admission: RE | Admit: 2016-09-09 | Discharge: 2016-09-09 | Disposition: A | Discharge status: Home / Self Care | Payer: 59 | Source: Ambulatory Visit | Attending: Obstetrics and Gynecology | Admitting: Obstetrics and Gynecology

## 2016-09-10 MED FILL — LIALDA 1.2 GM TABLET SA: 1.2 | 30 days supply | Qty: 120 | Fill #5

## 2016-09-10 MED FILL — FOLIC ACID 1 MG TABLET: 1 | 90 days supply | Qty: 90 | Fill #2

## 2016-09-10 MED FILL — HUMIRA PEN 40 MG/0.8ML PNKT: 40 | 28 days supply | Qty: 2 | Fill #2

## 2016-09-24 MED FILL — URSODIOL 500 MG TABLET: 500 | 30 days supply | Qty: 60 | Fill #0

## 2016-10-02 DIAGNOSIS — N870 Mild cervical dysplasia: Secondary | ICD-10-CM | POA: Diagnosis not present

## 2016-10-02 DIAGNOSIS — R87610 Atypical squamous cells of undetermined significance on cytologic smear of cervix (ASC-US): Secondary | ICD-10-CM | POA: Diagnosis not present

## 2016-10-09 MED FILL — predniSONE 10 MG TABS: 10 | 16 days supply | Qty: 40 | Fill #1

## 2016-10-16 MED FILL — HUMIRA PEN 40 MG/0.8ML PNKT: 40 | 28 days supply | Qty: 2 | Fill #3

## 2016-10-16 MED FILL — METOPROLOL SUCC ER 100 MG T: 100 | 90 days supply | Qty: 90 | Fill #2

## 2016-10-16 MED FILL — predniSONE 10 MG TABS: 10 | 21 days supply | Qty: 30 | Fill #0

## 2016-10-23 MED FILL — URSODIOL 500 MG TABLET: 500 | 30 days supply | Qty: 60 | Fill #1

## 2016-11-04 DIAGNOSIS — D509 Iron deficiency anemia, unspecified: Secondary | ICD-10-CM | POA: Diagnosis not present

## 2016-11-04 DIAGNOSIS — K743 Primary biliary cirrhosis: Secondary | ICD-10-CM | POA: Diagnosis not present

## 2016-11-04 DIAGNOSIS — Z1211 Encounter for screening for malignant neoplasm of colon: Secondary | ICD-10-CM | POA: Diagnosis not present

## 2016-11-04 DIAGNOSIS — R197 Diarrhea, unspecified: Secondary | ICD-10-CM | POA: Diagnosis not present

## 2016-11-04 DIAGNOSIS — K5190 Ulcerative colitis, unspecified, without complications: Secondary | ICD-10-CM | POA: Diagnosis not present

## 2016-11-27 DIAGNOSIS — D509 Iron deficiency anemia, unspecified: Secondary | ICD-10-CM | POA: Diagnosis not present

## 2016-11-27 DIAGNOSIS — K5190 Ulcerative colitis, unspecified, without complications: Secondary | ICD-10-CM | POA: Diagnosis not present

## 2016-11-27 DIAGNOSIS — R140 Abdominal distension (gaseous): Secondary | ICD-10-CM | POA: Diagnosis not present

## 2016-11-27 DIAGNOSIS — K8309 Other cholangitis: Secondary | ICD-10-CM | POA: Diagnosis not present

## 2016-11-27 MED FILL — NORETHINDRONE 0.35 MG TAB: 0.35 | 84 days supply | Qty: 84 | Fill #1

## 2016-11-27 MED FILL — URSODIOL 500 MG TABLET: 500 | 30 days supply | Qty: 60 | Fill #2

## 2016-12-04 ENCOUNTER — Other Ambulatory Visit: Payer: Self-pay | Admitting: Gastroenterology

## 2016-12-04 DIAGNOSIS — R748 Abnormal levels of other serum enzymes: Secondary | ICD-10-CM

## 2016-12-04 DIAGNOSIS — K5190 Ulcerative colitis, unspecified, without complications: Secondary | ICD-10-CM

## 2016-12-04 NOTE — Progress Notes (Signed)
Jamie Mann MD 

## 2016-12-08 DIAGNOSIS — K1379 Other lesions of oral mucosa: Secondary | ICD-10-CM | POA: Diagnosis not present

## 2016-12-10 ENCOUNTER — Ambulatory Visit (HOSPITAL_COMMUNITY)
Admission: RE | Admit: 2016-12-10 | Discharge: 2016-12-10 | Disposition: A | Discharge status: Home / Self Care | Payer: 59 | Source: Ambulatory Visit | Attending: Gastroenterology | Admitting: Gastroenterology

## 2016-12-10 DIAGNOSIS — R748 Abnormal levels of other serum enzymes: Secondary | ICD-10-CM | POA: Insufficient documentation

## 2016-12-10 DIAGNOSIS — K5190 Ulcerative colitis, unspecified, without complications: Secondary | ICD-10-CM | POA: Insufficient documentation

## 2016-12-10 DIAGNOSIS — R7989 Other specified abnormal findings of blood chemistry: Secondary | ICD-10-CM | POA: Diagnosis not present

## 2016-12-11 DIAGNOSIS — K8309 Other cholangitis: Secondary | ICD-10-CM | POA: Diagnosis not present

## 2016-12-11 DIAGNOSIS — R748 Abnormal levels of other serum enzymes: Secondary | ICD-10-CM | POA: Diagnosis not present

## 2016-12-11 DIAGNOSIS — K5190 Ulcerative colitis, unspecified, without complications: Secondary | ICD-10-CM | POA: Diagnosis not present

## 2016-12-23 ENCOUNTER — Other Ambulatory Visit: Payer: Self-pay | Admitting: Pharmacist

## 2016-12-30 ENCOUNTER — Ambulatory Visit (HOSPITAL_BASED_OUTPATIENT_CLINIC_OR_DEPARTMENT_OTHER): Payer: 59 | Admitting: Pharmacist

## 2016-12-30 ENCOUNTER — Other Ambulatory Visit: Payer: Self-pay | Admitting: Pharmacist

## 2016-12-30 DIAGNOSIS — K5190 Ulcerative colitis, unspecified, without complications: Secondary | ICD-10-CM

## 2016-12-30 MED ORDER — TOFACITINIB CITRATE 10 MG PO TABS: 1.0000 | ORAL_TABLET | Freq: Two times a day (BID) | ORAL | 12 refills | Status: DC

## 2016-12-30 MED FILL — XELJANZ 10 MG TABS: 10 | 30 days supply | Qty: 60 | Fill #0

## 2016-12-30 MED FILL — URSODIOL 500 MG TABLET: 500 | 30 days supply | Qty: 60 | Fill #3

## 2016-12-30 NOTE — Progress Notes (Signed)
   S: Patient presents to [facility] for review of their specialty medication therapy.  Patient is currently taking Xeljanz for ulcerative colitis. Patient is managed by Dr. Collene Mares for this.   She was previously on Humira but failed therapy.   Adherence: denies any missed doses  Efficacy: seems to be working well so far but just started.  Dosing:  Ulcerative colitis, moderate to severe :Maintenance: 5 or 10 mg twice daily    Dosing: Adjustment for Toxicity  Lymphopenia (lymphocytes ?500 cells/mm3): Maintain dose. Lymphopenia (lymphocytes <500 cells/mm3) confirmed by repeat evaluation: Discontinue therapy. Neutropenia (ANC >1,000 cells/mm3): Maintain dose. Neutropenia (ANC persistently between 500 to 1,000 cells/mm3): Interrupt therapy; resume at 5 mg immediate release twice daily or 11 mg extended release once daily when ANC >1,000 cells/mm3. Neutropenia (ANC <500 cells/mm3) confirmed by repeat evaluation: Discontinue therapy. Anemia (hemoglobin ?9 g/dL and decrease ?2 g/dL): Maintain dose. Anemia (hemoglobin <8 g/dL or decrease >2 g/dL) confirmed by repeat evaluation: Interrupt therapy until hemoglobin values have normalized.  Monitoring: S/sx of infection: denies S/sx of malignancy: denies Bone marrow suppression: denies GI adverse effects: denies Headache: denies LFTs: denies Cardiovascular effects (decreased HR): denies   O:     Lab Results  Component Value Date   WBC 3.4 (L) 07/13/2014   HGB 9.9 (L) 07/13/2014   HCT 30.3 (L) 07/13/2014   MCV 94.4 07/13/2014   PLT 315 07/13/2014      Chemistry   No results found for: NA, K, CL, CO2, BUN, CREATININE, GLU No results found for: CALCIUM, ALKPHOS, AST, ALT, BILITOT   Last labs seen on [facility]  A/P: 1. Medication review: patient currently taking Xeljanz for ulcerative colitis and is tolerating it well with no adverse effects. Reviewed the medications with the patient including the following:  Xeljanz (tofacitinib) is a Janus Associated Kinase Inhibitor which prevents cytokine- or growth factor-medicated gene expression and intracellular activity of immune cells, reduces circulating CD16/56+ natural killer cells, serum IgG, IgM, IgA, and C-reactive protein, and increases B cells. Adverse effects include increased risk of infection, risk of malignancy, as well as bone marrow suppression, GI upset, and decreased heart rate. . Patient should avoid live vaccinations while on this medication. No recommendations for changes at this time. Will contact Dr. Lorie Apley office for most recent labs and office note.   Christella Hartigan, PharmD, BCPS, BCACP, [facility] [phone]

## 2017-01-07 MED FILL — METOPROLOL SUCC ER 100 MG T: 100 | 90 days supply | Qty: 90 | Fill #0

## 2017-01-15 MED FILL — PEG-3350 AND ELECTROLYTES S: 236 | 1 days supply | Qty: 4000 | Fill #0

## 2017-01-19 DIAGNOSIS — K529 Noninfective gastroenteritis and colitis, unspecified: Secondary | ICD-10-CM | POA: Diagnosis not present

## 2017-01-19 DIAGNOSIS — K6389 Other specified diseases of intestine: Secondary | ICD-10-CM | POA: Diagnosis not present

## 2017-01-19 DIAGNOSIS — Z1211 Encounter for screening for malignant neoplasm of colon: Secondary | ICD-10-CM | POA: Diagnosis not present

## 2017-01-19 DIAGNOSIS — K6289 Other specified diseases of anus and rectum: Secondary | ICD-10-CM | POA: Diagnosis not present

## 2017-01-19 DIAGNOSIS — K5190 Ulcerative colitis, unspecified, without complications: Secondary | ICD-10-CM | POA: Diagnosis not present

## 2017-02-02 MED FILL — URSODIOL 500 MG TABLET: 500 | 30 days supply | Qty: 60 | Fill #4

## 2017-02-05 DIAGNOSIS — K5190 Ulcerative colitis, unspecified, without complications: Secondary | ICD-10-CM | POA: Diagnosis not present

## 2017-02-05 MED FILL — XELJANZ 10 MG TABS: 10 | 30 days supply | Qty: 60 | Fill #1

## 2017-02-09 DIAGNOSIS — I10 Essential (primary) hypertension: Secondary | ICD-10-CM | POA: Diagnosis not present

## 2017-02-09 MED FILL — HYDROCHLOROTHIAZIDE 25 MG T: 25 | 90 days supply | Qty: 90 | Fill #0

## 2017-02-12 DIAGNOSIS — D509 Iron deficiency anemia, unspecified: Secondary | ICD-10-CM | POA: Diagnosis not present

## 2017-02-12 DIAGNOSIS — K51019 Ulcerative (chronic) pancolitis with unspecified complications: Secondary | ICD-10-CM | POA: Diagnosis not present

## 2017-02-20 MED FILL — NORETHINDRONE 0.35 MG TAB: 0.35 | 84 days supply | Qty: 84 | Fill #2

## 2017-03-02 MED FILL — URSODIOL 500 MG TABLET: 500 | 30 days supply | Qty: 60 | Fill #5

## 2017-03-13 DIAGNOSIS — I10 Essential (primary) hypertension: Secondary | ICD-10-CM | POA: Diagnosis not present

## 2017-03-16 MED FILL — XELJANZ 10 MG TABS: 10 | 30 days supply | Qty: 60 | Fill #2

## 2017-03-24 DIAGNOSIS — D509 Iron deficiency anemia, unspecified: Secondary | ICD-10-CM | POA: Diagnosis not present

## 2017-03-30 MED FILL — FOLIC ACID 1 MG TABLET: 1 | 90 days supply | Qty: 90 | Fill #0

## 2017-03-30 MED FILL — URSODIOL 500 MG TABLET: 500 | 30 days supply | Qty: 60 | Fill #6

## 2017-04-03 MED FILL — METOPROLOL SUCCINATE ER 100: 100 | 90 days supply | Qty: 90 | Fill #0

## 2017-04-20 MED FILL — XELJANZ 10 MG TABS: 10 | 30 days supply | Qty: 60 | Fill #3

## 2017-04-23 MED FILL — URSODIOL 500 MG TABLET: 500 | 30 days supply | Qty: 60 | Fill #7

## 2017-05-18 MED FILL — XELJANZ 10 MG TABS: 10 | 30 days supply | Qty: 60 | Fill #4

## 2017-05-18 MED FILL — NORETHINDRONE 0.35 MG TAB: 0.35 | 84 days supply | Qty: 84 | Fill #3

## 2017-05-31 MED FILL — HYDROCHLOROTHIAZIDE 25 MG T: 25 | 90 days supply | Qty: 90 | Fill #1

## 2017-06-01 MED FILL — URSODIOL 500 MG TABLET: 500 | 30 days supply | Qty: 60 | Fill #0

## 2017-06-02 DIAGNOSIS — I10 Essential (primary) hypertension: Secondary | ICD-10-CM | POA: Diagnosis not present

## 2017-06-02 DIAGNOSIS — M359 Systemic involvement of connective tissue, unspecified: Secondary | ICD-10-CM | POA: Diagnosis not present

## 2017-06-02 DIAGNOSIS — E7800 Pure hypercholesterolemia, unspecified: Secondary | ICD-10-CM | POA: Diagnosis not present

## 2017-06-02 DIAGNOSIS — K5190 Ulcerative colitis, unspecified, without complications: Secondary | ICD-10-CM | POA: Diagnosis not present

## 2017-06-02 DIAGNOSIS — Z0000 Encounter for general adult medical examination without abnormal findings: Secondary | ICD-10-CM | POA: Diagnosis not present

## 2017-06-02 DIAGNOSIS — D649 Anemia, unspecified: Secondary | ICD-10-CM | POA: Diagnosis not present

## 2017-06-08 ENCOUNTER — Telehealth: Payer: Self-pay | Admitting: Hematology and Oncology

## 2017-06-08 NOTE — Telephone Encounter (Signed)
Appointment scheduled referring office  And patient notified

## 2017-06-10 ENCOUNTER — Other Ambulatory Visit (HOSPITAL_COMMUNITY): Payer: Self-pay | Admitting: *Deleted

## 2017-06-11 ENCOUNTER — Ambulatory Visit (HOSPITAL_COMMUNITY)
Admission: RE | Admit: 2017-06-11 | Discharge: 2017-06-11 | Disposition: A | Discharge status: Home / Self Care | Payer: 59 | Source: Ambulatory Visit | Attending: Family Medicine | Admitting: Family Medicine

## 2017-06-11 DIAGNOSIS — D649 Anemia, unspecified: Secondary | ICD-10-CM | POA: Diagnosis not present

## 2017-06-11 MED ORDER — SODIUM CHLORIDE 0.9 % IV SOLN
510.0000 mg | Freq: Once | INTRAVENOUS | Status: AC
  Administered 2017-06-11: 510 mg via INTRAVENOUS
  Filled 2017-06-11: qty 17

## 2017-06-11 NOTE — Discharge Instructions (Signed)

## 2017-06-18 MED FILL — XELJANZ 10 MG TABS: 10 | 30 days supply | Qty: 60 | Fill #5

## 2017-06-24 ENCOUNTER — Inpatient Hospital Stay: Payer: 59 | Attending: Hematology and Oncology | Admitting: Hematology and Oncology

## 2017-06-24 ENCOUNTER — Telehealth: Payer: Self-pay | Admitting: Hematology and Oncology

## 2017-06-24 DIAGNOSIS — Z79899 Other long term (current) drug therapy: Secondary | ICD-10-CM | POA: Diagnosis not present

## 2017-06-24 DIAGNOSIS — K5190 Ulcerative colitis, unspecified, without complications: Secondary | ICD-10-CM | POA: Insufficient documentation

## 2017-06-24 DIAGNOSIS — D500 Iron deficiency anemia secondary to blood loss (chronic): Secondary | ICD-10-CM | POA: Diagnosis not present

## 2017-06-24 DIAGNOSIS — D509 Iron deficiency anemia, unspecified: Secondary | ICD-10-CM | POA: Insufficient documentation

## 2017-06-24 DIAGNOSIS — I10 Essential (primary) hypertension: Secondary | ICD-10-CM | POA: Diagnosis not present

## 2017-06-24 MED FILL — FOLIC ACID 1 MG TABS: 1 | 90 days supply | Qty: 90 | Fill #1

## 2017-06-24 MED FILL — METOPROLOL SUCCINATE ER 100: 100 | 90 days supply | Qty: 90 | Fill #0

## 2017-06-24 NOTE — Assessment & Plan Note (Signed)
Iron deficiency anemia: I discussed with the patient the process of iron absorption. 11/04/2016: Hemoglobin 8.9, ferritin 6 06/02/2017: Hemoglobin 8.5, MCV 79, platelets 459, serum iron 17, ferritin not available  I counseled extensively regarding the different causes of iron deficiency including blood loss and malabsorption. Patient follows with gastroenterology for the ulcerative colitis.  She received 1 dose of IV iron therapy on 06/11/2017.  It is possible that the patient may still have an occult source of bleeding. However malabsorption is also a possibility.  Patient is intolerant of oral iron therapy.  Since she received 1 dose of IV iron already, we will administer 1 more dose of IV iron therapy.  Because of this I recommended IV iron.   Recommendation: Return to clinic in 1 month with recheck of her labs and follow-up. This will determine how often she will need intravenous iron therapy.

## 2017-06-24 NOTE — Progress Notes (Signed)
[facility] CONSULT NOTE  Patient Care Team: Gaynelle Arabian, MD as PCP - General (Family Medicine)  CHIEF COMPLAINTS/PURPOSE OF CONSULTATION:  Severe iron deficiency anemia  HISTORY OF PRESENTING ILLNESS:  Jamie Duarte 37 y.o. female is here because of recent diagnosis of severe iron deficiency anemia.  Patient has long-standing history of ulcerative colitis for which she has been on multiple treatments and lately she has been on [place] and her ulcerative colitis symptoms have improved significantly.  She was noted to have profound iron deficiency anemia and she received a dose of intravenous iron therapy at short stay on 06/11/2017.  She reports to me that she has not had any complaints of severe bleeding lately.  She had previously received oral iron therapy but she could not tolerate it.  I reviewed her records extensively and collaborated the history with the patient.  MEDICAL HISTORY:  Past Medical History:  Diagnosis Date  . Hypertension   . Ulcerative colitis ([place])     SURGICAL HISTORY: Denies any surgeries  SOCIAL HISTORY: Social History   Socioeconomic History  . Marital status: Single    Spouse name: Not on file  . Number of children: Not on file  . Years of education: Not on file  . Highest education level: Not on file  Occupational History  . Not on file  Social Needs  . Financial resource strain: Not on file  . Food insecurity:    Worry: Not on file    Inability: Not on file  . Transportation needs:    Medical: Not on file    Non-medical: Not on file  Tobacco Use  . Smoking status: Never Smoker  Substance and Sexual Activity  . Alcohol use: Not on file  . Drug use: Not on file  . Sexual activity: Not on file  Lifestyle  . Physical activity:    Days per week: Not on file    Minutes per session: Not on file  . Stress: Not on file  Relationships  . Social connections:    Talks on phone: Not on file    Gets together: Not on file     Attends religious service: Not on file    Active member of club or organization: Not on file    Attends meetings of clubs or organizations: Not on file    Relationship status: Not on file  . Intimate partner violence:    Fear of current or ex partner: Not on file    Emotionally abused: Not on file    Physically abused: Not on file    Forced sexual activity: Not on file  Other Topics Concern  . Not on file  Social History Narrative  . Not on file    FAMILY HISTORY: Family History  Problem Relation Age of Onset  . Allergic rhinitis Neg Hx   . Angioedema Neg Hx   . Asthma Neg Hx   . Atopy Neg Hx   . Eczema Neg Hx   . Urticaria Neg Hx   . Immunodeficiency Neg Hx     ALLERGIES:  has No Known Allergies.  MEDICATIONS:  Current Outpatient Medications  Medication Sig Dispense Refill  . hydrochlorothiazide (HYDRODIURIL) 25 MG tablet Take 25 mg by mouth daily. Reported on 06/06/2015    . metoprolol (LOPRESSOR) 50 MG tablet Take 50 mg by mouth 2 (two) times daily.    . Tofacitinib Citrate (XELJANZ) 10 MG TABS Take 1 tablet 2 (two) times daily by mouth. 60 tablet  12  . ursodiol (ACTIGALL) 500 MG tablet Take 500 mg by mouth 3 (three) times daily.     Current Facility-Administered Medications  Medication Dose Route Frequency Provider Last Rate Last Dose  . predniSONE (DELTASONE) tablet 10 mg  10 mg Oral UD Bobbitt, [place] Muta, MD        REVIEW OF SYSTEMS:   Constitutional: Denies fevers, chills or abnormal night sweats Eyes: Denies blurriness of vision, double vision or watery eyes Ears, nose, mouth, throat, and face: Denies mucositis or sore throat Respiratory: Denies cough, dyspnea or wheezes Cardiovascular: Denies palpitation, chest discomfort or lower extremity swelling Gastrointestinal:  Denies nausea, heartburn or change in bowel habits Skin: Denies abnormal skin rashes Lymphatics: Denies new lymphadenopathy or easy bruising Neurological:Denies numbness, tingling or new  weaknesses Behavioral/Psych: Mood is stable, no new changes   All other systems were reviewed with the patient and are negative.  PHYSICAL EXAMINATION: ECOG PERFORMANCE STATUS: 1 - Symptomatic but completely ambulatory  Vitals:   06/24/17 1259  BP: (!) 154/80  Pulse: 64  Resp: 20  Temp: 98.9 F (37.2 C)  SpO2: 100%   Filed Weights   06/24/17 1259  Weight: 184 lb 11.2 oz (83.8 kg)    GENERAL:alert, no distress and comfortable SKIN: skin color, texture, turgor are normal, no rashes or significant lesions EYES: normal, conjunctiva are pink and non-injected, sclera clear OROPHARYNX:no exudate, no erythema and lips, buccal mucosa, and tongue normal  NECK: supple, thyroid normal size, non-tender, without nodularity LYMPH:  no palpable lymphadenopathy in the cervical, axillary or inguinal LUNGS: clear to auscultation and percussion with normal breathing effort HEART: regular rate & rhythm and no murmurs and no lower extremity edema ABDOMEN:abdomen soft, non-tender and normal bowel sounds Musculoskeletal:no cyanosis of digits and no clubbing  PSYCH: alert & oriented x 3 with fluent speech NEURO: no focal motor/sensory deficits  LABORATORY DATA:  I have reviewed the data as listed Lab Results  Component Value Date   WBC 3.4 (L) 07/13/2014   HGB 9.9 (L) 07/13/2014   HCT 30.3 (L) 07/13/2014   MCV 94.4 07/13/2014   PLT 315 07/13/2014   No results found for: NA, K, CL, CO2  RADIOGRAPHIC STUDIES: I have personally reviewed the radiological reports and agreed with the findings in the report.  ASSESSMENT AND PLAN:  Iron deficiency anemia due to chronic blood loss Iron deficiency anemia: I discussed with the patient the process of iron absorption. 11/04/2016: Hemoglobin 8.9, ferritin 6 06/02/2017: Hemoglobin 8.5, MCV 79, platelets 459, serum iron 17, ferritin not available  I counseled extensively regarding the different causes of iron deficiency including blood loss and  malabsorption. Patient follows with gastroenterology for the ulcerative colitis.  She received 1 dose of IV iron therapy on 06/11/2017.  It is possible that the patient may still have an occult source of bleeding. However malabsorption is also a possibility.  Patient is intolerant of oral iron therapy.  Since she received 1 dose of IV iron already, we will administer 1 more dose of IV iron therapy.  Because of this I recommended IV iron.   Recommendation: Return to clinic in 1 month with recheck of her labs and follow-up. This will determine how often she will need intravenous iron therapy.   All questions were answered. The patient knows to call the clinic with any problems, questions or concerns.    Harriette Ohara, MD 06/24/17

## 2017-06-24 NOTE — Telephone Encounter (Signed)
Gave avs and calendar ° °

## 2017-06-25 MED FILL — URSODIOL 500 MG TABLET: 500 | 30 days supply | Qty: 60 | Fill #1

## 2017-06-26 ENCOUNTER — Inpatient Hospital Stay: Payer: 59

## 2017-06-26 VITALS — BP 123/75 | HR 55 | Temp 98.5°F | Resp 18

## 2017-06-26 DIAGNOSIS — D500 Iron deficiency anemia secondary to blood loss (chronic): Secondary | ICD-10-CM

## 2017-06-26 DIAGNOSIS — I10 Essential (primary) hypertension: Secondary | ICD-10-CM | POA: Diagnosis not present

## 2017-06-26 DIAGNOSIS — D509 Iron deficiency anemia, unspecified: Secondary | ICD-10-CM | POA: Diagnosis not present

## 2017-06-26 DIAGNOSIS — Z79899 Other long term (current) drug therapy: Secondary | ICD-10-CM | POA: Diagnosis not present

## 2017-06-26 DIAGNOSIS — K5190 Ulcerative colitis, unspecified, without complications: Secondary | ICD-10-CM | POA: Diagnosis not present

## 2017-06-26 MED ORDER — SODIUM CHLORIDE 0.9 % IV SOLN
510.0000 mg | Freq: Once | INTRAVENOUS | Status: AC
  Administered 2017-06-26: 510 mg via INTRAVENOUS
  Filled 2017-06-26: qty 17

## 2017-06-26 MED ORDER — SODIUM CHLORIDE 0.9 % IV SOLN
Freq: Once | INTRAVENOUS | Status: AC
  Administered 2017-06-26: 12:00:00 via INTRAVENOUS

## 2017-06-26 NOTE — Patient Instructions (Signed)

## 2017-07-22 MED FILL — XELJANZ 10 MG TABS: 10 | 30 days supply | Qty: 60 | Fill #6

## 2017-07-22 MED FILL — URSODIOL 500 MG TABLET: 500 | 30 days supply | Qty: 60 | Fill #2

## 2017-07-24 ENCOUNTER — Ambulatory Visit (INDEPENDENT_AMBULATORY_CARE_PROVIDER_SITE_OTHER): Payer: Self-pay | Admitting: Family Medicine

## 2017-07-24 ENCOUNTER — Encounter: Payer: Self-pay | Admitting: Family Medicine

## 2017-07-24 VITALS — BP 128/90 | HR 63 | Temp 98.5°F | Resp 16 | Wt 190.4 lb

## 2017-07-24 DIAGNOSIS — R05 Cough: Secondary | ICD-10-CM

## 2017-07-24 DIAGNOSIS — R059 Cough, unspecified: Secondary | ICD-10-CM

## 2017-07-24 DIAGNOSIS — J329 Chronic sinusitis, unspecified: Secondary | ICD-10-CM

## 2017-07-24 MED ORDER — PSEUDOEPH-BROMPHEN-DM 30-2-10 MG/5ML PO SYRP: 10.0000 mL | ORAL_SOLUTION | Freq: Three times a day (TID) | ORAL | 0 refills | Status: DC | PRN

## 2017-07-24 MED ORDER — IPRATROPIUM BROMIDE 0.06 % NA SOLN: 2.0000 | Freq: Four times a day (QID) | NASAL | 0 refills | Status: DC

## 2017-07-24 MED ORDER — AMOXICILLIN-POT CLAVULANATE 875-125 MG PO TABS: 1.0000 | ORAL_TABLET | Freq: Two times a day (BID) | ORAL | 0 refills | Status: DC

## 2017-07-24 MED FILL — BROMIPHENIR-PSEUDOEPHED-DM: 30-2-10 | 4 days supply | Qty: 120 | Fill #0

## 2017-07-24 MED FILL — AMOX TR-K CLV 875-125 MG TA: 875-125 | 10 days supply | Qty: 20 | Fill #0

## 2017-07-24 MED FILL — IPRATROPIUM 0.06% SPRAY: 0.06 | 10 days supply | Qty: 15 | Fill #0

## 2017-07-24 NOTE — Patient Instructions (Signed)

## 2017-07-24 NOTE — Progress Notes (Signed)
Jamie Duarte is a 37 y.o. female who presents today with concerns of 3 days of nasal congestion and cough.  Review of Systems  Constitutional: Negative for chills, fever and malaise/fatigue.  HENT: Negative for congestion, ear discharge, ear pain, sinus pain and sore throat.   Eyes: Negative.   Respiratory: Negative for cough, sputum production and shortness of breath.   Cardiovascular: Negative.  Negative for chest pain.  Gastrointestinal: Negative for abdominal pain, diarrhea, nausea and vomiting.  Genitourinary: Negative for dysuria, frequency, hematuria and urgency.  Musculoskeletal: Negative for myalgias.  Skin: Negative.   Neurological: Negative for headaches.  Endo/Heme/Allergies: Negative.   Psychiatric/Behavioral: Negative.     O: Vitals:   07/24/17 1251  BP: 128/90  Pulse: 63  Resp: 16  Temp: 98.5 F (36.9 C)  SpO2: 98%     Physical Exam  Constitutional: She is oriented to person, place, and time. Vital signs are normal. She appears well-developed and well-nourished. She is active.  Non-toxic appearance. She does not have a sickly appearance.  HENT:  Head: Normocephalic.  Right Ear: Hearing, tympanic membrane, external ear and ear canal normal.  Left Ear: Hearing, tympanic membrane, external ear and ear canal normal.  Nose: Nose normal.  Mouth/Throat: Uvula is midline and oropharynx is clear and moist.  Neck: Normal range of motion. Neck supple.  Cardiovascular: Normal rate, regular rhythm, normal heart sounds and normal pulses.  Pulmonary/Chest: Effort normal and breath sounds normal.  Abdominal: Soft. Bowel sounds are normal.  Musculoskeletal: Normal range of motion.  Lymphadenopathy:       Head (right side): No submental and no submandibular adenopathy present.       Head (left side): No submental and no submandibular adenopathy present.    She has no cervical adenopathy.  Neurological: She is alert and oriented to person, place, and time.   Psychiatric: She has a normal mood and affect.  Vitals reviewed.   A: 1. Cough   2. Sinusitis, unspecified chronicity, unspecified location     P: Watchful waiting patient is going to [place] and wants to prevent or avoid illness. Exam findings, diagnosis etiology and medication use and indications reviewed with patient. Follow- Up and discharge instructions provided. No emergent/urgent issues found on exam.  Patient verbalized understanding of information provided and agrees with plan of care (POC), all questions answered.  1. Cough - brompheniramine-pseudoephedrine-DM 30-2-10 MG/5ML syrup; Take 10 mLs by mouth 3 (three) times daily as needed (with full glass of water).  2. Sinusitis, unspecified chronicity, unspecified location - amoxicillin-clavulanate (AUGMENTIN) 875-125 MG tablet; Take 1 tablet by mouth 2 (two) times daily. - ipratropium (ATROVENT) 0.06 % nasal spray; Place 2 sprays into both nostrils 4 (four) times daily.

## 2017-07-27 ENCOUNTER — Inpatient Hospital Stay: Payer: 59 | Attending: Hematology and Oncology

## 2017-07-27 DIAGNOSIS — Z79899 Other long term (current) drug therapy: Secondary | ICD-10-CM | POA: Insufficient documentation

## 2017-07-27 DIAGNOSIS — K5090 Crohn's disease, unspecified, without complications: Secondary | ICD-10-CM | POA: Diagnosis not present

## 2017-07-27 DIAGNOSIS — D509 Iron deficiency anemia, unspecified: Secondary | ICD-10-CM | POA: Insufficient documentation

## 2017-07-27 DIAGNOSIS — D500 Iron deficiency anemia secondary to blood loss (chronic): Secondary | ICD-10-CM

## 2017-07-27 LAB — CBC WITH DIFFERENTIAL (CANCER CENTER ONLY)
Basophils Absolute: 0 10*3/uL (ref 0.0–0.1)
Basophils Relative: 1 %
Eosinophils Absolute: 0.2 10*3/uL (ref 0.0–0.5)
Eosinophils Relative: 6 %
HCT: 37 % (ref 34.8–46.6)
Hemoglobin: 11.8 g/dL (ref 11.6–15.9)
Lymphocytes Relative: 42 %
Lymphs Abs: 1.7 10*3/uL (ref 0.9–3.3)
MCH: 28.6 pg (ref 25.1–34.0)
MCHC: 32.1 g/dL (ref 31.5–36.0)
MCV: 89.1 fL (ref 79.5–101.0)
Monocytes Absolute: 0.5 10*3/uL (ref 0.1–0.9)
Monocytes Relative: 13 %
Neutro Abs: 1.5 10*3/uL (ref 1.5–6.5)
Neutrophils Relative %: 38 %
Platelet Count: 281 10*3/uL (ref 145–400)
RBC: 4.15 MIL/uL (ref 3.70–5.45)
RDW: 20.8 % — ABNORMAL HIGH (ref 11.2–14.5)
WBC Count: 4 10*3/uL (ref 3.9–10.3)

## 2017-07-27 LAB — IRON AND TIBC
Iron: 81 ug/dL (ref 41–142)
Saturation Ratios: 25 % (ref 21–57)
TIBC: 326 ug/dL (ref 236–444)
UIBC: 245 ug/dL

## 2017-07-27 LAB — FERRITIN: Ferritin: 105 ng/mL (ref 9–269)

## 2017-07-29 ENCOUNTER — Telehealth: Payer: Self-pay | Admitting: Hematology and Oncology

## 2017-07-29 ENCOUNTER — Inpatient Hospital Stay (HOSPITAL_BASED_OUTPATIENT_CLINIC_OR_DEPARTMENT_OTHER): Payer: 59 | Admitting: Hematology and Oncology

## 2017-07-29 DIAGNOSIS — D509 Iron deficiency anemia, unspecified: Secondary | ICD-10-CM | POA: Diagnosis not present

## 2017-07-29 DIAGNOSIS — Z79899 Other long term (current) drug therapy: Secondary | ICD-10-CM | POA: Diagnosis not present

## 2017-07-29 DIAGNOSIS — K5090 Crohn's disease, unspecified, without complications: Secondary | ICD-10-CM

## 2017-07-29 DIAGNOSIS — D500 Iron deficiency anemia secondary to blood loss (chronic): Secondary | ICD-10-CM

## 2017-07-29 NOTE — Progress Notes (Signed)
Patient Care Team: Gaynelle Arabian, MD as PCP - General (Family Medicine)  DIAGNOSIS:  Encounter Diagnosis  Name Primary?  . Iron deficiency anemia due to chronic blood loss     CHIEF COMPLIANT: Follow-up of iron deficiency anemia  INTERVAL HISTORY: Jamie Duarte is a 37 year old with above-mentioned history of Crohn's disease related iron deficiency anemia who is here for one-month follow-up after receiving IV iron therapy.  She tolerated IV iron extremely well.  She has noticed improvement in her energy levels.  REVIEW OF SYSTEMS:   Constitutional: Denies fevers, chills or abnormal weight loss Eyes: Denies blurriness of vision Ears, nose, mouth, throat, and face: Denies mucositis or sore throat Respiratory: Denies cough, dyspnea or wheezes Cardiovascular: Denies palpitation, chest discomfort Gastrointestinal:  Denies nausea, heartburn or change in bowel habits Skin: Denies abnormal skin rashes Lymphatics: Denies new lymphadenopathy or easy bruising Neurological:Denies numbness, tingling or new weaknesses Behavioral/Psych: Mood is stable, no new changes  Extremities: No lower extremity edema  All other systems were reviewed with the patient and are negative.  I have reviewed the past medical history, past surgical history, social history and family history with the patient and they are unchanged from previous note.  ALLERGIES:  has No Known Allergies.  MEDICATIONS:  Current Outpatient Medications  Medication Sig Dispense Refill  . amoxicillin-clavulanate (AUGMENTIN) 875-125 MG tablet Take 1 tablet by mouth 2 (two) times daily. 20 tablet 0  . brompheniramine-pseudoephedrine-DM 30-2-10 MG/5ML syrup Take 10 mLs by mouth 3 (three) times daily as needed (with full glass of water). 191 mL 0  . folic acid (FOLVITE) 1 MG tablet Take 1 tablet by mouth daily.  4  . hydrochlorothiazide (HYDRODIURIL) 25 MG tablet Take 25 mg by mouth daily. Reported on 06/06/2015    . ipratropium  (ATROVENT) 0.06 % nasal spray Place 2 sprays into both nostrils 4 (four) times daily. 15 mL 0  . metoprolol (LOPRESSOR) 50 MG tablet Take 50 mg by mouth 2 (two) times daily.    . Multiple Vitamins-Minerals (MULTIVITAMIN ADULT PO) Take 1 tablet by mouth daily.    . norethindrone (MICRONOR,CAMILA,ERRIN) 0.35 MG tablet Take 1 tablet by mouth daily.    . Tofacitinib Citrate (XELJANZ) 10 MG TABS Take 1 tablet 2 (two) times daily by mouth. 60 tablet 12  . ursodiol (ACTIGALL) 500 MG tablet Take 500 mg by mouth 3 (three) times daily.     Current Facility-Administered Medications  Medication Dose Route Frequency Provider Last Rate Last Dose  . predniSONE (DELTASONE) tablet 10 mg  10 mg Oral UD Bobbitt, [place] Muta, MD        PHYSICAL EXAMINATION: ECOG PERFORMANCE STATUS: 0 - Asymptomatic  Vitals:   07/29/17 0827  BP: (!) 148/85  Pulse: 60  Resp: 18  Temp: 98.6 F (37 C)  SpO2: 100%   Filed Weights   07/29/17 0827  Weight: 192 lb (87.1 kg)    GENERAL:alert, no distress and comfortable SKIN: skin color, texture, turgor are normal, no rashes or significant lesions EYES: normal, Conjunctiva are pink and non-injected, sclera clear OROPHARYNX:no exudate, no erythema and lips, buccal mucosa, and tongue normal  NECK: supple, thyroid normal size, non-tender, without nodularity LYMPH:  no palpable lymphadenopathy in the cervical, axillary or inguinal LUNGS: clear to auscultation and percussion with normal breathing effort HEART: regular rate & rhythm and no murmurs and no lower extremity edema ABDOMEN:abdomen soft, non-tender and normal bowel sounds MUSCULOSKELETAL:no cyanosis of digits and no clubbing  NEURO: alert & oriented x  3 with fluent speech, no focal motor/sensory deficits EXTREMITIES: No lower extremity edema   LABORATORY DATA:  I have reviewed the data as listed No flowsheet data found.  Lab Results  Component Value Date   WBC 4.0 07/27/2017   HGB 11.8 07/27/2017   HCT  37.0 07/27/2017   MCV 89.1 07/27/2017   PLT 281 07/27/2017   NEUTROABS 1.5 07/27/2017    ASSESSMENT & PLAN:  Iron deficiency anemia due to chronic blood loss Iron deficiency anemia: I discussed with the patient the process of iron absorption. 11/04/2016: Hemoglobin 8.9, ferritin 6 06/02/2017: Hemoglobin 8.5, MCV 79, platelets 459, serum iron 17, ferritin not available Patient follows with gastroenterology for the ulcerative colitis. She received 1 dose of IV iron therapy on 06/11/2017 and another dose on 06/26/17 . Patient is intolerant of oral iron therapy.  Lab review: 07/27/2017: Ferritin 105, iron saturation 25%, TIBC 326, hemoglobin 11.8, platelet count 281  Patient had a decent response to IV iron therapy. She will return back to see [place] in 3 months with labs and follow-up   No orders of the defined types were placed in this encounter.  The patient has a good understanding of the overall plan. she agrees with it. she will call with any problems that may develop before the next visit here.   Harriette Ohara, MD 07/29/17

## 2017-07-29 NOTE — Telephone Encounter (Signed)
Gave patient avs and calendar of upcoming September appointments.

## 2017-07-29 NOTE — Assessment & Plan Note (Signed)
Iron deficiency anemia: I discussed with the patient the process of iron absorption. 11/04/2016: Hemoglobin 8.9, ferritin 6 06/02/2017: Hemoglobin 8.5, MCV 79, platelets 459, serum iron 17, ferritin not available Patient follows with gastroenterology for the ulcerative colitis. She received 1 dose of IV iron therapy on 06/11/2017 and another dose on 06/26/17 . Patient is intolerant of oral iron therapy.  Lab review: 07/27/2017: Ferritin 105, iron saturation 25%, TIBC 326, hemoglobin 11.8, platelet count 281  Patient had a decent response to IV iron therapy. She will return back to see [place] in 3 months with labs and follow-up

## 2017-08-12 MED FILL — NORETHINDRONE 0.35 MG TAB: 0.35 | 28 days supply | Qty: 28 | Fill #0

## 2017-08-17 MED FILL — XELJANZ 10 MG TABS: 10 | 30 days supply | Qty: 60 | Fill #7

## 2017-08-24 MED FILL — URSODIOL 500 MG TABLET: 500 | 30 days supply | Qty: 60 | Fill #3

## 2017-09-09 MED FILL — NORETHINDRONE 0.35 MG TAB: 0.35 | 28 days supply | Qty: 28 | Fill #1

## 2017-09-15 DIAGNOSIS — K8309 Other cholangitis: Secondary | ICD-10-CM | POA: Diagnosis not present

## 2017-09-15 DIAGNOSIS — D509 Iron deficiency anemia, unspecified: Secondary | ICD-10-CM | POA: Diagnosis not present

## 2017-09-15 DIAGNOSIS — K5190 Ulcerative colitis, unspecified, without complications: Secondary | ICD-10-CM | POA: Diagnosis not present

## 2017-09-23 MED FILL — XELJANZ 10 MG TABS: 10 | 30 days supply | Qty: 60 | Fill #8

## 2017-09-23 MED FILL — FOLIC ACID 1 MG TABS: 1 | 90 days supply | Qty: 90 | Fill #2

## 2017-09-23 MED FILL — METOPROLOL SUCCINATE ER 100: 100 | 90 days supply | Qty: 90 | Fill #1

## 2017-09-23 MED FILL — URSODIOL 500 MG TABLET: 500 | 30 days supply | Qty: 60 | Fill #4

## 2017-09-24 DIAGNOSIS — R922 Inconclusive mammogram: Secondary | ICD-10-CM | POA: Diagnosis not present

## 2017-10-06 DIAGNOSIS — Z01419 Encounter for gynecological examination (general) (routine) without abnormal findings: Secondary | ICD-10-CM | POA: Diagnosis not present

## 2017-10-06 DIAGNOSIS — Z1151 Encounter for screening for human papillomavirus (HPV): Secondary | ICD-10-CM | POA: Diagnosis not present

## 2017-10-06 DIAGNOSIS — Z6829 Body mass index (BMI) 29.0-29.9, adult: Secondary | ICD-10-CM | POA: Diagnosis not present

## 2017-10-06 DIAGNOSIS — N870 Mild cervical dysplasia: Secondary | ICD-10-CM | POA: Diagnosis not present

## 2017-10-08 MED FILL — NORETHINDRONE 0.35 MG TAB: 0.35 | 28 days supply | Qty: 28 | Fill #0

## 2017-10-15 NOTE — Progress Notes (Signed)
Office Visit Note  Patient: Jamie Duarte             Date of Birth: [birth year]           MRN: [id number]             PCP: Gaynelle Arabian, MD Referring: Gaynelle Arabian, MD Visit Date: 10/16/2017 Occupation: @GUAROCC @  Subjective:  Right knee pain   History of Present Illness: Jamie Duarte is a 37 y.o. female with history of positive ANA and ulcerative colitis.  She is on Xeljanz 10 mg 1 tablet daily for management of UC.  She denies any recent ulcerative colitis flares.  She presents today with pain in both knee joints, worse in the right knee.  Patient reports that she has been having increased right knee pain for the past 1 month.  She states that she started running more frequently.  She states that she exercises 2-3 times per week.  She also does strength  training.  She states that she wears knee braces when she is lifting weights.  She states the pain is most severe with flexion.  She states that her knee feels very tight and stiff.  She denies any injuries.  She states she occasionally feels a catching sensation when she is squatting but denies any other mechanical symptoms.  She states that she has noticed right knee joint swelling in her left knee swelling.  She denies having a cortisone injection in the past.  She denies any other joint pain or joint swelling at this time. She denies any new signs or symptoms of autoimmune disease.  She denies any sores in her mouth or nose.  She denies any rashes or hair loss.  She denies any swollen lymph nodes.  She denies any sicca symptoms or symptoms of Raynaud's.  She has not had any palpitations or shortness of breath recently.    Activities of Daily Living:  Patient reports morning stiffness for 0 minutes.   Patient Reports nocturnal pain.  Difficulty dressing/grooming: Denies Difficulty climbing stairs: Denies Difficulty getting out of chair: Denies Difficulty using hands for taps, buttons, cutlery, and/or writing:  Denies  Review of Systems  Constitutional: Negative for fatigue.  HENT: Negative for mouth sores, mouth dryness and nose dryness.   Eyes: Negative for pain, visual disturbance and dryness.  Respiratory: Negative for cough, hemoptysis, shortness of breath and difficulty breathing.   Cardiovascular: Negative for chest pain, palpitations, hypertension and swelling in legs/feet.  Gastrointestinal: Negative for blood in stool, constipation and diarrhea.  Endocrine: Negative for increased urination.  Genitourinary: Negative for painful urination.  Musculoskeletal: Positive for arthralgias, joint pain and joint swelling. Negative for myalgias, muscle weakness, morning stiffness, muscle tenderness and myalgias.  Skin: Negative for color change, pallor, rash, hair loss, nodules/bumps, skin tightness, ulcers and sensitivity to sunlight.  Allergic/Immunologic: Negative for susceptible to infections.  Neurological: Negative for dizziness, numbness, headaches and weakness.  Hematological: Negative for swollen glands.  Psychiatric/Behavioral: Negative for depressed mood and sleep disturbance. The patient is not nervous/anxious.     PMFS History:  Patient Active Problem List   Diagnosis Date Noted  . Iron deficiency anemia due to chronic blood loss 06/24/2017  . Ulcerative colitis ([place]) 03/04/2016  . Trochanteric bursitis of both hips 03/04/2016  . ANA positive 03/04/2016  . 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 High risk medication use 03/04/2016  . Autoimmune disease ([place]) 03/04/2016  . History of hypertension 03/04/2016  . History of hypothyroidism 03/04/2016  .  Urticaria 06/06/2015    Past Medical History:  Diagnosis Date  . Hypertension   . Ulcerative colitis ([place])     Family History  Problem Relation Age of Onset  . Healthy Mother   . Cancer Mother        breast   . Healthy Father   . Healthy Brother   . Allergic rhinitis Neg Hx   . Angioedema Neg Hx   . Asthma Neg Hx    . Atopy Neg Hx   . Eczema Neg Hx   . Urticaria Neg Hx   . Immunodeficiency Neg Hx    Past Surgical History:  Procedure Laterality Date  . BREAST LUMPECTOMY Left 1998  . CESAREAN SECTION     Social History   Social History Narrative  . Not on file    Objective: Vital Signs: BP (!) 145/93 (BP Location: Left Arm, Patient Position: Sitting, Cuff Size: Normal)   Pulse (!) 46   Resp 12   Ht 5\' 7"  (1.702 m)   Wt 188 lb 6.4 oz (85.5 kg)   BMI 29.51 kg/m    Physical Exam  Constitutional: She is oriented to person, place, and time. She appears well-developed and well-nourished.  HENT:  Head: Normocephalic and atraumatic.  No oral or nasal ulcerations noted.  No parotid swelling noted.    Eyes: Conjunctivae and EOM are normal.  Neck: Normal range of motion.  Cardiovascular: Normal rate, regular rhythm, normal heart sounds and intact distal pulses.  Pulmonary/Chest: Effort normal and breath sounds normal.  Abdominal: Soft. Bowel sounds are normal.  Lymphadenopathy:    She has cervical adenopathy.  Neurological: She is alert and oriented to person, place, and time.  Skin: Skin is warm and dry. Capillary refill takes less than 2 seconds.  No malar rash no skin lesions noted.  No digital ulcerations or signs of gangrene.  Psychiatric: She has a normal mood and affect. Her behavior is normal.  Nursing note and vitals reviewed.    Musculoskeletal Exam: C-spine, thoracic spine, lumbar spine good range of motion.  No midline spinal tenderness.  No SI joint tenderness.  Shoulder joints, elbow joints and wrist joints, MCPs, PIPs, DIPs good range of motion with no synovitis.  No warmth or effusion of knee joints.  Hip joints, ankle joints, MCPs, PIPs, DIPs good range of motion with no synovitis.  Small effusion of right knee with mild warmth.  Discomfort with range of motion of right knee joint.  She has bilateral knee crepitus.  No warmth or effusion of left knee joint.  No tenderness of  trochanteric bursa bilaterally.  CDAI Exam: CDAI Score: Not documented Patient Global Assessment: Not documented; Provider Global Assessment: Not documented Swollen: Not documented; Tender: Not documented Joint Exam   Not documented   There is currently no information documented on the homunculus. Go to the Rheumatology activity and complete the homunculus joint exam.  Investigation: No additional findings.  Imaging: Xr Knee 3 View Left  Result Date: 10/16/2017 Mild medial compartment narrowing was noted.  No chondrocalcinosis was noted.  Moderate patellofemoral narrowing was noted. Impression: These findings are consistent with mild osteoarthritis and moderate chondromalacia patella.  Xr Knee 3 View Right  Result Date: 10/16/2017 Mild medial compartment narrowing was noted.  No chondrocalcinosis was noted.  Moderate patellofemoral narrowing was noted. Impression: These findings are consistent with mild osteoarthritis and moderate chondromalacia patella.   Recent Labs: Lab Results  Component Value Date   WBC 4.0 07/27/2017   HGB  11.8 07/27/2017   PLT 281 07/27/2017    Speciality Comments: No specialty comments available.  Procedures:  Large Joint Inj: R knee on 10/16/2017 12:22 PM Indications: pain Details: 27 G 1.5 in needle, medial approach  Arthrogram: No  Medications: 3 mL lidocaine 1 %; 60 mg triamcinolone acetonide 40 MG/ML Aspirate: 0 mL Outcome: tolerated well, no immediate complications Procedure, treatment alternatives, risks and benefits explained, specific risks discussed. Consent was given by the patient. Immediately prior to procedure a time out was called to verify the correct patient, procedure, equipment, support staff and site/side marked as required. Patient was prepped and draped in the usual sterile fashion.     Allergies: Patient has no known allergies.   Assessment / Plan:     Visit Diagnoses: ANA positive - +Ro, +La, hypocomplementemia-She  presents today with a right knee effusion.  She denies any other joint pain or joint swelling. She has chronic palpable cervical lymphadenopathy.  She has a history of ulcerative colitis, and she is currently taking Xeljanz 10 mg po daily.  She has no other clinical signs or symptoms of autoimmune disease at this time. She has no malar rash or lesions noted.  She has no oral or nasal ulcerations noted on exam.  She has no sicca symptoms or symptoms of Raynaud's.  no digital ulcerations noted.  She has not had any worsening fatigue or low grade fevers.  she has not had any shortness or breath or palpitations.  We will the following autoimmune labs today. She was advised to notify us if she develops any new or worsening symptoms. She will follow up in 1 month. Plan: Urinalysis, Routine w reflex microscopic, C3 and C4, Anti-DNA antibody, double-stranded, Sedimentation rate, ANA, CBC with Differential/Platelet, COMPLETE METABOLIC PANEL WITH GFR, Lupus Anticoagulant Eval w/Reflex, Cardiolipin antibodies, IgG, IgM, IgA, Beta-2 glycoprotein antibodies, Sjogrens syndrome-B extractable nuclear antibody, Sjogrens syndrome-A extractable nuclear antibody, Anti-Smith antibody, RNP Antibody, Rheumatoid factor, Cyclic citrul peptide antibody, IgG, 14-3-3 eta Protein, Anti-scleroderma antibody, Angiotensin converting enzyme  Anticardiolipin antibody positive  Chronic pain of both knees -she is been having increased pain in both knee joints.  She has not had a recent injury.  She has been running more frequently and performing weightlifting exercises.  She has warmth and small effusion of the right knee on exam today.  She has discomfort with range of motion of her right knee.  She has bilateral knee crepitus.  X-rays of both knees were obtained today that revealed mild osteoarthritis and mild chondral malacia patella.  She requested a cortisone injection of the right knee joint today.  She tolerated procedure well.  An  aspiration was attempted but no fluid was aspirated.  We will check several labs as described above today.  She was also given a prescription for Voltaren gel which she can apply topically 3 times daily.  She is also given a handout of knee exercises that she can perform at home.  She was advised to take a break from running for the next few weeks.  Plan: XR KNEE 3 VIEW RIGHT, XR KNEE 3 VIEW LEFT, Uric acid, CANCELED: Synovial cell count + diff, w/ crystals, CANCELED: Anaerobic and Aerobic Culture  High risk medication use -She is currently on Xeljanz 10 mg by mouth daily.  In case we need to start her on another immunosuppressant the following labs were obtained today.  Plan: QuantiFERON-TB Gold Plus, Serum protein electrophoresis with reflex, HIV antibody, IgG, IgA, IgM, Hepatitis C antibody, Hepatitis B  surface antigen, Hepatitis B core antibody, IgM, Glucose 6 phosphate dehydrogenase  Effusion, right knee -She presents today with a small effusion and warmth of her right knee on exam.  An aspiration was attempted but no fluid was drawn off.  We are obtaining labs as described above.  No synovial analysis will be performed due to no specimen being obtained.  Plan: Uric acid, CANCELED: Synovial cell count + diff, w/ crystals, CANCELED: Anaerobic and Aerobic Culture   Other ulcerative colitis without complication ([place]) - She takes [place] 10mg  1 tablet by mouth daily. She follows up with Dr. Collene Mares regularly.  She has not had any recent flares. She has no abdominal tenderness on exam.  She is asymptomatic at this time.   Other medical conditions are listed as follows:   History of hypertension  History of hypothyroidism  Orders: Orders Placed This Encounter  Procedures  . XR KNEE 3 VIEW RIGHT  . XR KNEE 3 VIEW LEFT  . Urinalysis, Routine w reflex microscopic  . C3 and C4  . Anti-DNA antibody, double-stranded  . Sedimentation rate  . ANA  . CBC with Differential/Platelet  . COMPLETE METABOLIC  PANEL WITH GFR  . Lupus Anticoagulant Eval w/Reflex  . Cardiolipin antibodies, IgG, IgM, IgA  . Beta-2 glycoprotein antibodies  . Sjogrens syndrome-B extractable nuclear antibody  . Sjogrens syndrome-A extractable nuclear antibody  . Anti-Smith antibody  . RNP Antibody  . Rheumatoid factor  . Cyclic citrul peptide antibody, IgG  . 14-3-3 eta Protein  . QuantiFERON-TB Gold Plus  . Serum protein electrophoresis with reflex  . HIV antibody  . IgG, IgA, IgM  . Hepatitis C antibody  . Hepatitis B surface antigen  . Hepatitis B core antibody, IgM  . Glucose 6 phosphate dehydrogenase  . Anti-scleroderma antibody  . Angiotensin converting enzyme  . Uric acid   Meds ordered this encounter  Medications  . diclofenac sodium (VOLTAREN) 1 % GEL    Sig: Apply 3 grams to 3 large joints up to 3 times daily.    Dispense:  3 Tube    Refill:  3    Face-to-face time spent with patient was 30 minutes. Greater than 50% of time was spent in counseling and coordination of care.  Follow-Up Instructions: Return in about 4 weeks (around 11/13/2017) for Positive ANA , Ulcerative colitis .   Ofilia Neas, PA-C I examined and evaluated the patient with Hazel Sams PA.  Patient had warmth and swelling in her right knee joint with a small effusion.  Left knee joint had discomfort without any effusion.  The x-rays were unremarkable.  The right knee joint was aspirated but no fluid was obtained.  The knee joint was injected with cortisone as described above.  We will obtain labs as described above.  The plan of care was discussed as noted above.  Bo Merino, MD Note - This record has been created using Editor, commissioning.  Chart creation errors have been sought, but may not always  have been located. Such creation errors do not reflect on  the standard of medical care.

## 2017-10-16 ENCOUNTER — Ambulatory Visit: Payer: 59 | Admitting: Rheumatology

## 2017-10-16 ENCOUNTER — Encounter: Payer: Self-pay | Admitting: Rheumatology

## 2017-10-16 ENCOUNTER — Ambulatory Visit (INDEPENDENT_AMBULATORY_CARE_PROVIDER_SITE_OTHER): Payer: 59

## 2017-10-16 VITALS — BP 145/93 | HR 46 | Resp 12 | Ht 67.0 in | Wt 188.4 lb

## 2017-10-16 DIAGNOSIS — R760 Raised antibody titer: Secondary | ICD-10-CM | POA: Diagnosis not present

## 2017-10-16 DIAGNOSIS — M25561 Pain in right knee: Secondary | ICD-10-CM | POA: Diagnosis not present

## 2017-10-16 DIAGNOSIS — Z8679 Personal history of other diseases of the circulatory system: Secondary | ICD-10-CM | POA: Diagnosis not present

## 2017-10-16 DIAGNOSIS — Z79899 Other long term (current) drug therapy: Secondary | ICD-10-CM | POA: Diagnosis not present

## 2017-10-16 DIAGNOSIS — M25461 Effusion, right knee: Secondary | ICD-10-CM | POA: Diagnosis not present

## 2017-10-16 DIAGNOSIS — G8929 Other chronic pain: Secondary | ICD-10-CM

## 2017-10-16 DIAGNOSIS — R768 Other specified abnormal immunological findings in serum: Secondary | ICD-10-CM

## 2017-10-16 DIAGNOSIS — M25562 Pain in left knee: Secondary | ICD-10-CM | POA: Diagnosis not present

## 2017-10-16 DIAGNOSIS — Z8639 Personal history of other endocrine, nutritional and metabolic disease: Secondary | ICD-10-CM | POA: Diagnosis not present

## 2017-10-16 DIAGNOSIS — K5180 Other ulcerative colitis without complications: Secondary | ICD-10-CM | POA: Diagnosis not present

## 2017-10-16 MED ORDER — DICLOFENAC SODIUM 1 % TD GEL: TRANSDERMAL | 3 refills | Status: DC

## 2017-10-16 MED ORDER — LIDOCAINE HCL 1 % IJ SOLN
3.0000 mL | INTRAMUSCULAR | Status: AC | PRN
  Administered 2017-10-16: 3 mL

## 2017-10-16 MED ORDER — TRIAMCINOLONE ACETONIDE 40 MG/ML IJ SUSP
60.0000 mg | INTRAMUSCULAR | Status: AC | PRN
  Administered 2017-10-16: 60 mg via INTRA_ARTICULAR

## 2017-10-16 MED FILL — DICLOFENAC SODIUM 1% GEL: 1 | 11 days supply | Qty: 300 | Fill #0

## 2017-10-16 NOTE — Patient Instructions (Signed)

## 2017-10-21 MED FILL — XELJANZ 10 MG TABS: 10 | 30 days supply | Qty: 60 | Fill #9

## 2017-10-21 MED FILL — HYDROCHLOROTHIAZIDE 25 MG T: 25 | 90 days supply | Qty: 90 | Fill #0

## 2017-10-23 LAB — QUANTIFERON-TB GOLD PLUS
Mitogen-NIL: >10 IU/mL
NIL: 0.03 IU/mL
QuantiFERON-TB Gold Plus: NEGATIVE
TB1-NIL: 0.01 IU/mL
TB2-NIL: 0 IU/mL

## 2017-10-23 LAB — CBC WITH DIFFERENTIAL/PLATELET
Basophils Absolute: 12 cells/uL (ref 0–200)
Basophils Relative: 0.3 %
Eosinophils Absolute: 59 cells/uL (ref 15–500)
Eosinophils Relative: 1.5 %
HCT: 39.1 % (ref 35.0–45.0)
Hemoglobin: 13.3 g/dL (ref 11.7–15.5)
Lymphs Abs: 1661 cells/uL (ref 850–3900)
MCH: 30.6 pg (ref 27.0–33.0)
MCHC: 34 g/dL (ref 32.0–36.0)
MCV: 89.9 fL (ref 80.0–100.0)
MPV: 10.1 fL (ref 7.5–12.5)
Monocytes Relative: 11.7 %
Neutro Abs: 1712 cells/uL (ref 1500–7800)
Neutrophils Relative %: 43.9 %
Platelets: 331 10*3/uL (ref 140–400)
RBC: 4.35 10*6/uL (ref 3.80–5.10)
RDW: 11.8 % (ref 11.0–15.0)
Total Lymphocyte: 42.6 %
WBC mixed population: 456 cells/uL (ref 200–950)
WBC: 3.9 10*3/uL (ref 3.8–10.8)

## 2017-10-23 LAB — COMPLETE METABOLIC PANEL WITH GFR
AG Ratio: 1 (calc) (ref 1.0–2.5)
ALT: 44 U/L — ABNORMAL HIGH (ref 6–29)
AST: 31 U/L — ABNORMAL HIGH (ref 10–30)
Albumin: 4 g/dL (ref 3.6–5.1)
Alkaline phosphatase (APISO): 293 U/L — ABNORMAL HIGH (ref 33–115)
BUN: 13 mg/dL (ref 7–25)
CO2: 27 mmol/L (ref 20–32)
Calcium: 9.2 mg/dL (ref 8.6–10.2)
Chloride: 101 mmol/L (ref 98–110)
Creat: 0.97 mg/dL (ref 0.50–1.10)
GFR, Est African American: 86 mL/min/{1.73_m2} (ref >=60)
GFR, Est Non African American: 74 mL/min/{1.73_m2} (ref >=60)
Globulin: 3.9 g/dL (calc) — ABNORMAL HIGH (ref 1.9–3.7)
Glucose, Bld: 79 mg/dL (ref 65–99)
Potassium: 3.5 mmol/L (ref 3.5–5.3)
Sodium: 137 mmol/L (ref 135–146)
Total Bilirubin: 1 mg/dL (ref 0.2–1.2)
Total Protein: 7.9 g/dL (ref 6.1–8.1)

## 2017-10-23 LAB — URINALYSIS, ROUTINE W REFLEX MICROSCOPIC
Bilirubin Urine: NEGATIVE
Glucose, UA: NEGATIVE
Hgb urine dipstick: NEGATIVE
Ketones, ur: NEGATIVE
Leukocytes, UA: NEGATIVE
Nitrite: NEGATIVE
Protein, ur: NEGATIVE
Specific Gravity, Urine: 1.017 (ref 1.001–1.03)
pH: 5.5 (ref 5.0–8.0)

## 2017-10-23 LAB — PROTEIN ELECTROPHORESIS, SERUM, WITH REFLEX
Albumin ELP: 3.9 g/dL (ref 3.8–4.8)
Alpha 1: 0.3 g/dL (ref 0.2–0.3)
Alpha 2: 0.6 g/dL (ref 0.5–0.9)
Beta 2: 0.5 g/dL (ref 0.2–0.5)
Beta Globulin: 0.6 g/dL (ref 0.4–0.6)
Gamma Globulin: 2 g/dL — ABNORMAL HIGH (ref 0.8–1.7)
Total Protein: 7.9 g/dL (ref 6.1–8.1)

## 2017-10-23 LAB — LUPUS ANTICOAGULANT EVAL W/ REFLEX
PTT-LA Screen: 31 s (ref <=40)
dRVVT: 38 s (ref <=45)

## 2017-10-23 LAB — BETA-2 GLYCOPROTEIN ANTIBODIES
Beta-2 Glyco 1 IgA: <9 SAU (ref <=20)
Beta-2 Glyco 1 IgM: <9 SMU (ref <=20)
Beta-2 Glyco I IgG: <9 SGU (ref <=20)

## 2017-10-23 LAB — HEPATITIS B CORE ANTIBODY, IGM: Hep B C IgM: NONREACTIVE

## 2017-10-23 LAB — HEPATITIS B SURFACE ANTIGEN: Hepatitis B Surface Ag: NONREACTIVE

## 2017-10-23 LAB — HEPATITIS C ANTIBODY
Hepatitis C Ab: NONREACTIVE
SIGNAL TO CUT-OFF: 0.05 (ref <1.00)

## 2017-10-23 LAB — ANTI-NUCLEAR AB-TITER (ANA TITER): ANA Titer 1: 1:320 {titer} — ABNORMAL HIGH

## 2017-10-23 LAB — ANTI-SCLERODERMA ANTIBODY: Scleroderma (Scl-70) (ENA) Antibody, IgG: <1 AI

## 2017-10-23 LAB — 14-3-3 ETA PROTEIN: 14-3-3 eta Protein: <0.2 ng/mL (ref <0.2)

## 2017-10-23 LAB — CARDIOLIPIN ANTIBODIES, IGG, IGM, IGA
Anticardiolipin IgA: <11 [APL'U]
Anticardiolipin IgG: <14 [GPL'U]
Anticardiolipin IgM: <12 [MPL'U]

## 2017-10-23 LAB — ANA: Anti Nuclear Antibody(ANA): POSITIVE — AB

## 2017-10-23 LAB — GLUCOSE 6 PHOSPHATE DEHYDROGENASE: G-6PDH: 11.1 U/g Hgb (ref 7.0–20.5)

## 2017-10-23 LAB — SJOGRENS SYNDROME-A EXTRACTABLE NUCLEAR ANTIBODY: SSA (Ro) (ENA) Antibody, IgG: >8 AI — AB

## 2017-10-23 LAB — RNP ANTIBODY: Ribonucleic Protein(ENA) Antibody, IgG: <1 AI

## 2017-10-23 LAB — C3 AND C4
C3 Complement: 132 mg/dL (ref 83–193)
C4 Complement: 19 mg/dL (ref 15–57)

## 2017-10-23 LAB — CYCLIC CITRUL PEPTIDE ANTIBODY, IGG: Cyclic Citrullin Peptide Ab: 19 UNITS

## 2017-10-23 LAB — IGG, IGA, IGM
IgG (Immunoglobin G), Serum: 2289 mg/dL — ABNORMAL HIGH (ref 600–1640)
IgM, Serum: 89 mg/dL (ref 50–300)
Immunoglobulin A: 323 mg/dL — ABNORMAL HIGH (ref 47–310)

## 2017-10-23 LAB — RHEUMATOID FACTOR: Rhuematoid fact SerPl-aCnc: <14 IU/mL (ref <14)

## 2017-10-23 LAB — ANTI-SMITH ANTIBODY: ENA SM Ab Ser-aCnc: <1 AI

## 2017-10-23 LAB — URIC ACID: Uric Acid, Serum: 5.3 mg/dL (ref 2.5–7.0)

## 2017-10-23 LAB — ANGIOTENSIN CONVERTING ENZYME: Angiotensin-Converting Enzyme: 41 U/L (ref 9–67)

## 2017-10-23 LAB — SEDIMENTATION RATE: Sed Rate: 31 mm/h — ABNORMAL HIGH (ref 0–20)

## 2017-10-23 LAB — HIV ANTIBODY (ROUTINE TESTING W REFLEX): HIV 1&2 Ab, 4th Generation: NONREACTIVE

## 2017-10-23 LAB — ANTI-DNA ANTIBODY, DOUBLE-STRANDED: ds DNA Ab: 2 IU/mL

## 2017-10-23 LAB — SJOGRENS SYNDROME-B EXTRACTABLE NUCLEAR ANTIBODY: SSB (La) (ENA) Antibody, IgG: <1 AI

## 2017-10-23 NOTE — Progress Notes (Signed)
We will discuss at follow up visit on 11/10/17.

## 2017-10-26 MED FILL — FLUCONAZOLE 150 MG TABS: 150 | 1 days supply | Qty: 1 | Fill #0

## 2017-10-27 NOTE — Progress Notes (Signed)
Office Visit Note  Patient: Jamie Duarte             Date of Birth: 09/01/1980           MRN: [id number]             PCP: Gaynelle Arabian, MD Referring: Gaynelle Arabian, MD Visit Date: 11/10/2017 Occupation: @GUAROCC @  Subjective:  Review lab work   History of Present Illness: Jamie Duarte is a 37 y.o. female with history of osteoarthritis and ulcerative colitis. She is on Xeljanz 10 mg by mouth daily. She denies any recent ulcerative colitis flares.  She denies any abdominal pain, diarrhea, or blood in stool at this time.  She states that her right knee pain has improved significantly since having the right knee cortisone injection.  She denies any knee joint swelling.  She continues to have mild left knee joint pain.  She has been using the elliptical but has not started running again.  She denies any knee joint mechanical symptoms. She uses voltaren gel PRN. She denies any other joint pain or joint swelling at this time.  She denies any other concerns at this time.     Activities of Daily Living:  Patient reports morning stiffness for 0 minutes.   Patient Denies nocturnal pain.  Difficulty dressing/grooming: Denies Difficulty climbing stairs: Denies Difficulty getting out of chair: Denies Difficulty using hands for taps, buttons, cutlery, and/or writing: Denies  Review of Systems  Constitutional: Negative for fatigue.  HENT: Negative for mouth sores, trouble swallowing, trouble swallowing, mouth dryness and nose dryness.   Eyes: Negative for pain, visual disturbance and dryness.  Respiratory: Negative for cough, hemoptysis, shortness of breath and difficulty breathing.   Cardiovascular: Negative for chest pain, palpitations, hypertension and swelling in legs/feet.  Gastrointestinal: Negative for abdominal pain, blood in stool, constipation, diarrhea, nausea and vomiting.  Endocrine: Negative for increased urination.  Genitourinary: Negative for painful urination,  nocturia and pelvic pain.  Musculoskeletal: Positive for arthralgias, joint pain and joint swelling. Negative for myalgias, muscle weakness, morning stiffness, muscle tenderness and myalgias.  Skin: Negative for color change, pallor, rash, hair loss, nodules/bumps, skin tightness, ulcers and sensitivity to sunlight.  Allergic/Immunologic: Negative for susceptible to infections.  Neurological: Negative for dizziness, light-headedness, numbness, headaches, memory loss and weakness.  Hematological: Negative for bruising/bleeding tendency and swollen glands.  Psychiatric/Behavioral: Negative for depressed mood, confusion and sleep disturbance. The patient is not nervous/anxious.     PMFS History:  Patient Active Problem List   Diagnosis Date Noted  . Iron deficiency anemia due to chronic blood loss 06/24/2017  . Ulcerative colitis ([place]) 03/04/2016  . Trochanteric bursitis of both hips 03/04/2016  . ANA positive 03/04/2016  . 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 High risk medication use 03/04/2016  . Autoimmune disease ([place]) 03/04/2016  . History of hypertension 03/04/2016  . History of hypothyroidism 03/04/2016  . Urticaria 06/06/2015    Past Medical History:  Diagnosis Date  . Hypertension   . Ulcerative colitis ([place])     Family History  Problem Relation Age of Onset  . Healthy Mother   . Cancer Mother        breast   . Healthy Father   . Healthy Brother   . Allergic rhinitis Neg Hx   . Angioedema Neg Hx   . Asthma Neg Hx   . Atopy Neg Hx   . Eczema Neg Hx   . Urticaria Neg Hx   . Immunodeficiency Neg Hx  Past Surgical History:  Procedure Laterality Date  . BREAST LUMPECTOMY Left 1998  . CESAREAN SECTION     Social History   Social History Narrative  . Not on file    Objective: Vital Signs: BP 135/84 (BP Location: Left Arm, Patient Position: Sitting, Cuff Size: Normal)   Pulse (!) 56   Resp 12   Ht 5\' 7"  (1.702 m)   Wt 185 lb (83.9 kg)   BMI 28.98  kg/m    Physical Exam  Constitutional: She is oriented to person, place, and time. She appears well-developed and well-nourished.  HENT:  Head: Normocephalic and atraumatic.  Eyes: Conjunctivae and EOM are normal.  Neck: Normal range of motion.  Cardiovascular: Normal rate, regular rhythm, normal heart sounds and intact distal pulses.  Pulmonary/Chest: Effort normal and breath sounds normal.  Abdominal: Soft. Bowel sounds are normal.  Lymphadenopathy:    She has no cervical adenopathy.  Neurological: She is alert and oriented to person, place, and time.  Skin: Skin is warm and dry. Capillary refill takes less than 2 seconds.  Psychiatric: She has a normal mood and affect. Her behavior is normal.  Nursing note and vitals reviewed.    Musculoskeletal Exam: C-spine, thoracic spine, and lumbar spine good ROM.  No midline spinal tenderness.  No SI joint tenderness.  Shoulder joints, elbow joints, wrist joints, MCPs, PIPs, and DIPs good ROM with no synovitis or tenderness.  Complete fist formation bilaterally.  Hip joints, knee joints, ankle joints, MTPs, PIPs, and DIPs good ROM with no synovitis.  No warmth or effusion of knee joints.  Bilateral knee crepitus.  No tenderness of trochanteric bursa bilaterally.  No achilles tendonitis or plantar fasciitis.   CDAI Exam: CDAI Score: Not documented Patient Global Assessment: Not documented; Provider Global Assessment: Not documented Swollen: Not documented; Tender: Not documented Joint Exam   Not documented   There is currently no information documented on the homunculus. Go to the Rheumatology activity and complete the homunculus joint exam.  Investigation: No additional findings.  Imaging: Xr Knee 3 View Left  Result Date: 10/16/2017 Mild medial compartment narrowing was noted.  No chondrocalcinosis was noted.  Moderate patellofemoral narrowing was noted. Impression: These findings are consistent with mild osteoarthritis and moderate  chondromalacia patella.  Xr Knee 3 View Right  Result Date: 10/16/2017 Mild medial compartment narrowing was noted.  No chondrocalcinosis was noted.  Moderate patellofemoral narrowing was noted. Impression: These findings are consistent with mild osteoarthritis and moderate chondromalacia patella.   Recent Labs: Lab Results  Component Value Date   WBC 4.7 11/03/2017   HGB 12.7 11/03/2017   PLT 274 11/03/2017   NA 137 10/16/2017   K 3.5 10/16/2017   CL 101 10/16/2017   CO2 27 10/16/2017   GLUCOSE 79 10/16/2017   BUN 13 10/16/2017   CREATININE 0.97 10/16/2017   BILITOT 1.0 10/16/2017   AST 31 (H) 10/16/2017   ALT 44 (H) 10/16/2017   PROT 7.9 10/16/2017   PROT 7.9 10/16/2017   CALCIUM 9.2 10/16/2017   GFRAA 86 10/16/2017   QFTBGOLDPLUS NEGATIVE 10/16/2017    Speciality Comments: No specialty comments available.  Procedures:  No procedures performed Allergies: Patient has no known allergies.   Assessment / Plan:     Visit Diagnoses: ANA positive: 1:320 Homogenous, Ro+, smith and dsDNA negative. All the autoimmune labs drawn at her last visit were reviewed with her today in the office. She has no clinical signs or symptoms of autoimmune disease at this  time.  She has no sicca symptoms and no parotid swelling on exam.  She has had an EKG in the past.  She was advised to notify us if she develops any new or worsening symptoms.    Anticardiolipin antibody positive: Negative when rechecked on 10/16/17.    Other ulcerative colitis without complication ([place]): She is taking Xeljanz 10 mg by mouth daily.  She has not had any recent flares.  She is clinically doing well on xeljanz.  She has on abdominal pain, diarrhea, or blood in her stool at this time.   Effusion, right knee: Resolved.  No warmth or effusion noted.  She has good ROM.  Her pain and swelling have improved significantly since having a cortisone injection on 10/16/17.  She can continue using voltaren gel PRN.  She does not  need a refill.  She was advised to notify us if she develops increased joint pain or joint swelling.   Primary osteoarthritis of both knees: She has bilateral knee crepitus.  No warmth or effusion noted.  She has mild left knee discomfort at this time.   Other medical conditions are listed as follows:   History of hypertension  History of hypothyroidism   Orders: No orders of the defined types were placed in this encounter.  No orders of the defined types were placed in this encounter.   Follow-Up Instructions: Return in about 5 months (around 04/12/2018) for Positive ANA, UC, Osteoarthritis.   Ofilia Neas, PA-C  Note - This record has been created using Dragon software.  Chart creation errors have been sought, but may not always  have been located. Such creation errors do not reflect on  the standard of medical care.

## 2017-10-30 MED FILL — URSODIOL 500 MG TABLET: 500 | 30 days supply | Qty: 60 | Fill #5

## 2017-10-30 MED FILL — NORETHINDRONE 0.35 MG TAB: 0.35 | 84 days supply | Qty: 84 | Fill #0

## 2017-11-03 ENCOUNTER — Inpatient Hospital Stay: Payer: 59 | Attending: Hematology and Oncology

## 2017-11-03 DIAGNOSIS — D509 Iron deficiency anemia, unspecified: Secondary | ICD-10-CM | POA: Diagnosis not present

## 2017-11-03 DIAGNOSIS — K5190 Ulcerative colitis, unspecified, without complications: Secondary | ICD-10-CM | POA: Diagnosis not present

## 2017-11-03 DIAGNOSIS — D500 Iron deficiency anemia secondary to blood loss (chronic): Secondary | ICD-10-CM

## 2017-11-03 DIAGNOSIS — Z79899 Other long term (current) drug therapy: Secondary | ICD-10-CM | POA: Insufficient documentation

## 2017-11-03 LAB — CBC WITH DIFFERENTIAL (CANCER CENTER ONLY)
Basophils Absolute: 0 10*3/uL (ref 0.0–0.1)
Basophils Relative: 0 %
Eosinophils Absolute: 0.1 10*3/uL (ref 0.0–0.5)
Eosinophils Relative: 2 %
HCT: 38.2 % (ref 34.8–46.6)
Hemoglobin: 12.7 g/dL (ref 11.6–15.9)
Lymphocytes Relative: 31 %
Lymphs Abs: 1.4 10*3/uL (ref 0.9–3.3)
MCH: 31.3 pg (ref 25.1–34.0)
MCHC: 33.2 g/dL (ref 31.5–36.0)
MCV: 94.5 fL (ref 79.5–101.0)
Monocytes Absolute: 0.6 10*3/uL (ref 0.1–0.9)
Monocytes Relative: 12 %
Neutro Abs: 2.6 10*3/uL (ref 1.5–6.5)
Neutrophils Relative %: 55 %
Platelet Count: 274 10*3/uL (ref 145–400)
RBC: 4.04 MIL/uL (ref 3.70–5.45)
RDW: 13.4 % (ref 11.2–14.5)
WBC Count: 4.7 10*3/uL (ref 3.9–10.3)

## 2017-11-03 LAB — IRON AND TIBC
Iron: 79 ug/dL (ref 41–142)
Saturation Ratios: 19 % — ABNORMAL LOW (ref 21–57)
TIBC: 423 ug/dL (ref 236–444)
UIBC: 344 ug/dL

## 2017-11-03 LAB — FERRITIN: Ferritin: 14 ng/mL (ref 11–307)

## 2017-11-04 NOTE — Assessment & Plan Note (Signed)
Iron deficiency anemia: I discussed with the patient the process of iron absorption. 11/04/2016: Hemoglobin 8.9, ferritin 6 06/02/2017: Hemoglobin 8.5, MCV 79, platelets 459, serum iron 17, ferritin not available Patient follows with gastroenterology for the ulcerative colitis. She received 1 dose of IV iron therapy on 06/11/2017 and another dose on 06/26/17 . Patient isintolerant oforal iron therapy.  Lab review: 11/03/2017: Ferritin 14 (was 105), iron saturation 19% (25%), TIBC 423 (326), hemoglobin 12.7 (was 11.8), platelet count 274

## 2017-11-05 ENCOUNTER — Inpatient Hospital Stay: Payer: 59 | Admitting: Hematology and Oncology

## 2017-11-05 ENCOUNTER — Telehealth: Payer: Self-pay

## 2017-11-05 DIAGNOSIS — D509 Iron deficiency anemia, unspecified: Secondary | ICD-10-CM

## 2017-11-05 DIAGNOSIS — Z79899 Other long term (current) drug therapy: Secondary | ICD-10-CM | POA: Diagnosis not present

## 2017-11-05 DIAGNOSIS — K5190 Ulcerative colitis, unspecified, without complications: Secondary | ICD-10-CM | POA: Diagnosis not present

## 2017-11-05 DIAGNOSIS — D500 Iron deficiency anemia secondary to blood loss (chronic): Secondary | ICD-10-CM

## 2017-11-05 NOTE — Progress Notes (Signed)
Patient Care Team: Gaynelle Arabian, MD as PCP - General (Family Medicine)  DIAGNOSIS:  Encounter Diagnosis  Name Primary?  . Iron deficiency anemia due to chronic blood loss    IV iron infusion April 2019  CHIEF COMPLIANT: Follow-up of iron deficiency anemia  INTERVAL HISTORY: Jamie Duarte is a 37 year old with a history of ulcerative colitis who and iron deficiency anemia and was intolerant to oral iron therapy.  She received IV iron therapy in April 2019 and had remarkable improvement in her symptoms.  She had more energy than previously.  So far she is also feeling reasonably well.  Does not have fatigue or lightheadedness or dizziness.  She feels cold sensation whenever she runs low in iron accompanied by fatigue and headaches.  She does not feel those symptoms at this time.  REVIEW OF SYSTEMS:   Constitutional: Denies fevers, chills or abnormal weight loss Eyes: Denies blurriness of vision Ears, nose, mouth, throat, and face: Denies mucositis or sore throat Respiratory: Denies cough, dyspnea or wheezes Cardiovascular: Denies palpitation, chest discomfort Gastrointestinal:  Denies nausea, heartburn or change in bowel habits Skin: Denies abnormal skin rashes Lymphatics: Denies new lymphadenopathy or easy bruising Neurological:Denies numbness, tingling or new weaknesses Behavioral/Psych: Mood is stable, no new changes  Extremities: No lower extremity edema   All other systems were reviewed with the patient and are negative.  I have reviewed the past medical history, past surgical history, social history and family history with the patient and they are unchanged from previous note.  ALLERGIES:  has No Known Allergies.  MEDICATIONS:  Current Outpatient Medications  Medication Sig Dispense Refill  . diclofenac sodium (VOLTAREN) 1 % GEL Apply 3 grams to 3 large joints up to 3 times daily. 3 Tube 3  . folic acid (FOLVITE) 1 MG tablet Take 1 tablet by mouth daily.  4  .  hydrochlorothiazide (HYDRODIURIL) 25 MG tablet Take 25 mg by mouth daily. Reported on 06/06/2015    . metoprolol (LOPRESSOR) 50 MG tablet Take 50 mg by mouth 2 (two) times daily.    . Multiple Vitamins-Minerals (MULTIVITAMIN ADULT PO) Take 1 tablet by mouth daily.    . norethindrone (MICRONOR,CAMILA,ERRIN) 0.35 MG tablet Take 1 tablet by mouth daily.    . Tofacitinib Citrate (XELJANZ) 10 MG TABS Take 1 tablet 2 (two) times daily by mouth. (Patient taking differently: Take 1 tablet by mouth daily. ) 60 tablet 12  . ursodiol (ACTIGALL) 500 MG tablet Take 500 mg by mouth 3 (three) times daily.     Current Facility-Administered Medications  Medication Dose Route Frequency Provider Last Rate Last Dose  . predniSONE (DELTASONE) tablet 10 mg  10 mg Oral UD Bobbitt, [place] Muta, MD        PHYSICAL EXAMINATION: ECOG PERFORMANCE STATUS: 0 - Asymptomatic  Vitals:   11/05/17 0859  BP: (!) 154/97  Pulse: (!) 54  Resp: 18  Temp: 98.3 F (36.8 C)  SpO2: 100%   Filed Weights   11/05/17 0859  Weight: 187 lb 4.8 oz (85 kg)    GENERAL:alert, no distress and comfortable SKIN: skin color, texture, turgor are normal, no rashes or significant lesions EYES: normal, Conjunctiva are pink and non-injected, sclera clear OROPHARYNX:no exudate, no erythema and lips, buccal mucosa, and tongue normal  NECK: supple, thyroid normal size, non-tender, without nodularity LYMPH:  no palpable lymphadenopathy in the cervical, axillary or inguinal LUNGS: clear to auscultation and percussion with normal breathing effort HEART: regular rate & rhythm and no murmurs  and no lower extremity edema ABDOMEN:abdomen soft, non-tender and normal bowel sounds MUSCULOSKELETAL:no cyanosis of digits and no clubbing  NEURO: alert & oriented x 3 with fluent speech, no focal motor/sensory deficits EXTREMITIES: No lower extremity edema    LABORATORY DATA:  I have reviewed the data as listed CMP Latest Ref Rng & Units 10/16/2017  10/16/2017  Glucose 65 - 99 mg/dL - 79  BUN 7 - 25 mg/dL - 13  Creatinine 0.50 - 1.10 mg/dL - 0.97  Sodium 135 - 146 mmol/L - 137  Potassium 3.5 - 5.3 mmol/L - 3.5  Chloride 98 - 110 mmol/L - 101  CO2 20 - 32 mmol/L - 27  Calcium 8.6 - 10.2 mg/dL - 9.2  Total Protein 6.1 - 8.1 g/dL 7.9 7.9  Total Bilirubin 0.2 - 1.2 mg/dL - 1.0  AST 10 - 30 U/L - 31(H)  ALT 6 - 29 U/L - 44(H)    Lab Results  Component Value Date   WBC 4.7 11/03/2017   HGB 12.7 11/03/2017   HCT 38.2 11/03/2017   MCV 94.5 11/03/2017   PLT 274 11/03/2017   NEUTROABS 2.6 11/03/2017    ASSESSMENT & PLAN:  Iron deficiency anemia due to chronic blood loss Iron deficiency anemia: I discussed with the patient the process of iron absorption. 11/04/2016: Hemoglobin 8.9, ferritin 6 06/02/2017: Hemoglobin 8.5, MCV 79, platelets 459, serum iron 17, ferritin not available Patient follows with gastroenterology for the ulcerative colitis. She received 1 dose of IV iron therapy on 06/11/2017 and another dose on 06/26/17 . Patient isintolerant oforal iron therapy.  Lab review: 11/03/2017: Ferritin 14 (was 105), iron saturation 19% (25%), TIBC 423 (326), hemoglobin 12.7 (was 11.8), platelet count 274  I reviewed the blood work with the patient and determined that she does not need any IV iron at this time. We will plan to see her back in 3 months with recheck of her labs 1 to 2 days ahead of the visit. Instructed her to call us if she has any problems or concerns.  No orders of the defined types were placed in this encounter.  The patient has a good understanding of the overall plan. she agrees with it. she will call with any problems that may develop before the next visit here.   Harriette Ohara, MD 11/05/17

## 2017-11-05 NOTE — Telephone Encounter (Signed)
Printed avs and calender of upcoming appointment. Per 9/19 los 

## 2017-11-10 ENCOUNTER — Encounter: Payer: Self-pay | Admitting: Physician Assistant

## 2017-11-10 ENCOUNTER — Ambulatory Visit: Payer: 59 | Admitting: Physician Assistant

## 2017-11-10 VITALS — BP 135/84 | HR 56 | Resp 12 | Ht 67.0 in | Wt 185.0 lb

## 2017-11-10 DIAGNOSIS — M170 Bilateral primary osteoarthritis of knee: Secondary | ICD-10-CM | POA: Diagnosis not present

## 2017-11-10 DIAGNOSIS — Z8679 Personal history of other diseases of the circulatory system: Secondary | ICD-10-CM | POA: Diagnosis not present

## 2017-11-10 DIAGNOSIS — K5180 Other ulcerative colitis without complications: Secondary | ICD-10-CM

## 2017-11-10 DIAGNOSIS — M25461 Effusion, right knee: Secondary | ICD-10-CM

## 2017-11-10 DIAGNOSIS — R760 Raised antibody titer: Secondary | ICD-10-CM

## 2017-11-10 DIAGNOSIS — Z8639 Personal history of other endocrine, nutritional and metabolic disease: Secondary | ICD-10-CM

## 2017-11-10 DIAGNOSIS — R768 Other specified abnormal immunological findings in serum: Secondary | ICD-10-CM

## 2017-12-01 MED FILL — URSODIOL 500 MG TABLET: 500 | 30 days supply | Qty: 60 | Fill #6

## 2017-12-04 MED FILL — XELJANZ 10 MG TABS: 10 | 30 days supply | Qty: 60 | Fill #10

## 2017-12-10 ENCOUNTER — Other Ambulatory Visit: Payer: Self-pay

## 2017-12-10 ENCOUNTER — Inpatient Hospital Stay: Payer: 59 | Attending: Hematology and Oncology

## 2017-12-10 DIAGNOSIS — D500 Iron deficiency anemia secondary to blood loss (chronic): Secondary | ICD-10-CM

## 2017-12-10 DIAGNOSIS — D509 Iron deficiency anemia, unspecified: Secondary | ICD-10-CM | POA: Diagnosis not present

## 2017-12-10 LAB — CBC WITH DIFFERENTIAL (CANCER CENTER ONLY)
Abs Immature Granulocytes: 0.01 10*3/uL (ref 0.00–0.07)
Basophils Absolute: 0 10*3/uL (ref 0.0–0.1)
Basophils Relative: 0 %
Eosinophils Absolute: 0.1 10*3/uL (ref 0.0–0.5)
Eosinophils Relative: 3 %
HCT: 41.4 % (ref 36.0–46.0)
Hemoglobin: 13.3 g/dL (ref 12.0–15.0)
Immature Granulocytes: 0 %
Lymphocytes Relative: 40 %
Lymphs Abs: 1.5 10*3/uL (ref 0.7–4.0)
MCH: 30.8 pg (ref 26.0–34.0)
MCHC: 32.1 g/dL (ref 30.0–36.0)
MCV: 95.8 fL (ref 80.0–100.0)
Monocytes Absolute: 0.5 10*3/uL (ref 0.1–1.0)
Monocytes Relative: 12 %
Neutro Abs: 1.7 10*3/uL (ref 1.7–7.7)
Neutrophils Relative %: 45 %
Platelet Count: 259 10*3/uL (ref 150–400)
RBC: 4.32 MIL/uL (ref 3.87–5.11)
RDW: 11.9 % (ref 11.5–15.5)
WBC Count: 3.8 10*3/uL — ABNORMAL LOW (ref 4.0–10.5)
nRBC: 0 % (ref 0.0–0.2)

## 2017-12-11 LAB — IRON AND TIBC
Iron: 55 ug/dL (ref 41–142)
Saturation Ratios: 15 % — ABNORMAL LOW (ref 21–57)
TIBC: 366 ug/dL (ref 236–444)
UIBC: 311 ug/dL

## 2017-12-11 LAB — FERRITIN: Ferritin: 53 ng/mL (ref 11–307)

## 2017-12-20 MED FILL — METOPROLOL SUCCINATE ER 100: 100 | 90 days supply | Qty: 90 | Fill #2

## 2017-12-23 MED FILL — FOLIC ACID 1 MG TABS: 1 | 90 days supply | Qty: 90 | Fill #0

## 2017-12-29 MED FILL — XELJANZ 10 MG TABS: 10 | 30 days supply | Qty: 60 | Fill #11

## 2018-01-07 ENCOUNTER — Other Ambulatory Visit: Payer: Self-pay | Admitting: Internal Medicine

## 2018-01-07 MED FILL — URSODIOL 500 MG TABLET: 500 | 30 days supply | Qty: 60 | Fill #7

## 2018-01-21 NOTE — Progress Notes (Signed)
Patient Care Team: Gaynelle Arabian, MD as PCP - General (Family Medicine)  DIAGNOSIS:    ICD-10-CM   1. Iron deficiency anemia due to chronic blood loss D50.0     CHIEF COMPLIANT: Follow-up of iron deficiency anemia  INTERVAL HISTORY: Jamie Duarte is a 37 y.o. with above-mentioned history of ulcerative colitis and iron deficiency anemia and was intolerant to oral iron therapy. She received IV iron therapy in April 2019 and had remarkable improvement in her symptoms. She presents to the clinic today to check labs. She notes she is doing okay, her energy levels are normal and she denies other symptoms. She notes she is taking an OTC iron supplement daily. Her CBC from 01/29/18 shows Hg 13.9, iron saturation at 24%, and Ferratin at 62. She reviewed her medication list with me.   REVIEW OF SYSTEMS:   Constitutional: Denies fevers, chills or abnormal weight loss Eyes: Denies blurriness of vision Ears, nose, mouth, throat, and face: Denies mucositis or sore throat Respiratory: Denies cough, dyspnea or wheezes Cardiovascular: Denies palpitation, chest discomfort Gastrointestinal:  Denies nausea, heartburn or change in bowel habits Skin: Denies abnormal skin rashes Lymphatics: Denies new lymphadenopathy or easy bruising Neurological:Denies numbness, tingling or new weaknesses Behavioral/Psych: Mood is stable, no new changes  Extremities: No lower extremity edema All other systems were reviewed with the patient and are negative.  I have reviewed the past medical history, past surgical history, social history and family history with the patient and they are unchanged from previous note.  ALLERGIES:  has No Known Allergies.  MEDICATIONS:  Current Outpatient Medications  Medication Sig Dispense Refill  . diclofenac sodium (VOLTAREN) 1 % GEL Apply 3 grams to 3 large joints up to 3 times daily. 3 Tube 3  . folic acid (FOLVITE) 1 MG tablet Take 1 tablet by mouth daily.  4  .  hydrochlorothiazide (HYDRODIURIL) 25 MG tablet Take 25 mg by mouth daily. Reported on 06/06/2015    . metoprolol (LOPRESSOR) 50 MG tablet Take 50 mg by mouth 2 (two) times daily.    . Multiple Vitamins-Minerals (MULTIVITAMIN ADULT PO) Take 1 tablet by mouth daily.    . norethindrone (MICRONOR,CAMILA,ERRIN) 0.35 MG tablet Take 1 tablet by mouth daily.    . Tofacitinib Citrate (XELJANZ) 10 MG TABS Take 1 tablet by mouth daily.    . ursodiol (ACTIGALL) 500 MG tablet Take 1 tablet (500 mg total) by mouth 2 (two) times daily.     Current Facility-Administered Medications  Medication Dose Route Frequency Provider Last Rate Last Dose  . predniSONE (DELTASONE) tablet 10 mg  10 mg Oral UD Bobbitt, [place] Muta, MD        PHYSICAL EXAMINATION: ECOG PERFORMANCE STATUS: 1 - Symptomatic but completely ambulatory  Vitals:   02/02/18 1011  BP: (!) 165/93  Pulse: 82  Resp: 18  Temp: 98.7 F (37.1 C)  SpO2: 100%   Filed Weights   02/02/18 1011  Weight: 198 lb 3.2 oz (89.9 kg)    GENERAL:alert, no distress and comfortable SKIN: skin color, texture, turgor are normal, no rashes or significant lesions EYES: normal, Conjunctiva are pink and non-injected, sclera clear OROPHARYNX:no exudate, no erythema and lips, buccal mucosa, and tongue normal  NECK: supple, thyroid normal size, non-tender, without nodularity LYMPH:  no palpable lymphadenopathy in the cervical, axillary or inguinal LUNGS: clear to auscultation and percussion with normal breathing effort HEART: regular rate & rhythm and no murmurs and no lower extremity edema ABDOMEN:abdomen soft, non-tender and normal  bowel sounds MUSCULOSKELETAL:no cyanosis of digits and no clubbing  NEURO: alert & oriented x 3 with fluent speech, no focal motor/sensory deficits EXTREMITIES: No lower extremity edema  LABORATORY DATA:  I have reviewed the data as listed CMP Latest Ref Rng & Units 10/16/2017 10/16/2017  Glucose 65 - 99 mg/dL - 79  BUN 7 - 25  mg/dL - 13  Creatinine 0.50 - 1.10 mg/dL - 0.97  Sodium 135 - 146 mmol/L - 137  Potassium 3.5 - 5.3 mmol/L - 3.5  Chloride 98 - 110 mmol/L - 101  CO2 20 - 32 mmol/L - 27  Calcium 8.6 - 10.2 mg/dL - 9.2  Total Protein 6.1 - 8.1 g/dL 7.9 7.9  Total Bilirubin 0.2 - 1.2 mg/dL - 1.0  AST 10 - 30 U/L - 31(H)  ALT 6 - 29 U/L - 44(H)    Lab Results  Component Value Date   WBC 6.5 01/29/2018   HGB 13.9 01/29/2018   HCT 42.5 01/29/2018   MCV 93.4 01/29/2018   PLT 332 01/29/2018   NEUTROABS 3.8 01/29/2018    ASSESSMENT & PLAN:  Iron deficiency anemia due to chronic blood loss Iron deficiency anemia due to ulcerative colitis, intolerant of oral iron therapy. 11/04/2016: Hemoglobin 8.9, ferritin 6 06/02/2017: Hemoglobin 8.5, MCV 79, platelets 459, serum iron 17, ferritin not available Patient follows with gastroenterology for the ulcerative colitis.   Prior IV iron therapy: 4/25/2019X 2 doses.  Lab review: Iron saturation 24%, TIBC 324, hemoglobin 13.9, MCV 93.4, RDW 11.4 I discussed with the patient that she has excellent blood work and does not require IV iron at this time. She is she has done well without requiring IV iron for the past 8 months, we can see her on an as-needed basis. I encouraged her to continue with over-the-counter iron supplement.  She appears to be tolerating that fairly well.   No orders of the defined types were placed in this encounter.  The patient has a good understanding of the overall plan. she agrees with it. she will call with any problems that may develop before the next visit here.  Nicholas Lose, MD 02/02/2018   I, Cloyde Reams Dorshimer, am acting as scribe for Nicholas Lose, MD.  I have reviewed the above documentation for accuracy and completeness, and I agree with the above.

## 2018-01-27 MED FILL — PEG-3350 AND ELECTROLYTES S: 236 | 1 days supply | Qty: 4000 | Fill #0

## 2018-01-27 MED FILL — NORETHINDRONE 0.35 MG TAB: 0.35 | 84 days supply | Qty: 84 | Fill #1

## 2018-01-29 ENCOUNTER — Inpatient Hospital Stay: Payer: 59 | Attending: Hematology and Oncology

## 2018-01-29 DIAGNOSIS — D509 Iron deficiency anemia, unspecified: Secondary | ICD-10-CM | POA: Diagnosis present

## 2018-01-29 DIAGNOSIS — K51918 Ulcerative colitis, unspecified with other complication: Secondary | ICD-10-CM | POA: Insufficient documentation

## 2018-01-29 DIAGNOSIS — D500 Iron deficiency anemia secondary to blood loss (chronic): Secondary | ICD-10-CM | POA: Diagnosis not present

## 2018-01-29 DIAGNOSIS — Z79899 Other long term (current) drug therapy: Secondary | ICD-10-CM | POA: Insufficient documentation

## 2018-01-29 LAB — CBC WITH DIFFERENTIAL (CANCER CENTER ONLY)
Abs Immature Granulocytes: 0.02 10*3/uL (ref 0.00–0.07)
Basophils Absolute: 0 10*3/uL (ref 0.0–0.1)
Basophils Relative: 0 %
Eosinophils Absolute: 0.1 10*3/uL (ref 0.0–0.5)
Eosinophils Relative: 2 %
HCT: 42.5 % (ref 36.0–46.0)
Hemoglobin: 13.9 g/dL (ref 12.0–15.0)
Immature Granulocytes: 0 %
Lymphocytes Relative: 29 %
Lymphs Abs: 1.9 10*3/uL (ref 0.7–4.0)
MCH: 30.5 pg (ref 26.0–34.0)
MCHC: 32.7 g/dL (ref 30.0–36.0)
MCV: 93.4 fL (ref 80.0–100.0)
Monocytes Absolute: 0.7 10*3/uL (ref 0.1–1.0)
Monocytes Relative: 11 %
Neutro Abs: 3.8 10*3/uL (ref 1.7–7.7)
Neutrophils Relative %: 58 %
Platelet Count: 332 10*3/uL (ref 150–400)
RBC: 4.55 MIL/uL (ref 3.87–5.11)
RDW: 11.4 % — ABNORMAL LOW (ref 11.5–15.5)
WBC Count: 6.5 10*3/uL (ref 4.0–10.5)
nRBC: 0 % (ref 0.0–0.2)

## 2018-01-29 LAB — IRON AND TIBC
Iron: 78 ug/dL (ref 41–142)
Saturation Ratios: 24 % (ref 21–57)
TIBC: 324 ug/dL (ref 236–444)
UIBC: 246 ug/dL (ref 120–384)

## 2018-01-29 LAB — FERRITIN: Ferritin: 62 ng/mL (ref 11–307)

## 2018-02-01 DIAGNOSIS — K5190 Ulcerative colitis, unspecified, without complications: Secondary | ICD-10-CM | POA: Diagnosis not present

## 2018-02-01 DIAGNOSIS — K5090 Crohn's disease, unspecified, without complications: Secondary | ICD-10-CM | POA: Diagnosis not present

## 2018-02-01 DIAGNOSIS — K6289 Other specified diseases of anus and rectum: Secondary | ICD-10-CM | POA: Diagnosis not present

## 2018-02-01 DIAGNOSIS — Z1211 Encounter for screening for malignant neoplasm of colon: Secondary | ICD-10-CM | POA: Diagnosis not present

## 2018-02-02 ENCOUNTER — Other Ambulatory Visit: Payer: Self-pay

## 2018-02-02 ENCOUNTER — Inpatient Hospital Stay (HOSPITAL_BASED_OUTPATIENT_CLINIC_OR_DEPARTMENT_OTHER): Payer: 59 | Admitting: Hematology and Oncology

## 2018-02-02 ENCOUNTER — Telehealth: Payer: Self-pay | Admitting: Hematology and Oncology

## 2018-02-02 DIAGNOSIS — K51918 Ulcerative colitis, unspecified with other complication: Secondary | ICD-10-CM

## 2018-02-02 DIAGNOSIS — Z79899 Other long term (current) drug therapy: Secondary | ICD-10-CM

## 2018-02-02 DIAGNOSIS — D500 Iron deficiency anemia secondary to blood loss (chronic): Secondary | ICD-10-CM

## 2018-02-02 MED ORDER — TOFACITINIB CITRATE 10 MG PO TABS: 1.0000 | ORAL_TABLET | Freq: Every day | ORAL | Status: DC

## 2018-02-02 MED ORDER — URSODIOL 500 MG PO TABS: 500.0000 mg | ORAL_TABLET | Freq: Two times a day (BID) | ORAL | Status: DC

## 2018-02-02 NOTE — Assessment & Plan Note (Signed)
Iron deficiency anemia due to ulcerative colitis, intolerant of oral iron therapy. 11/04/2016: Hemoglobin 8.9, ferritin 6 06/02/2017: Hemoglobin 8.5, MCV 79, platelets 459, serum iron 17, ferritin not available Patient follows with gastroenterology for the ulcerative colitis.   Prior IV iron therapy: 4/25/2019X 2 doses.  Lab review: Iron saturation 24%, TIBC 324, hemoglobin 13.9, MCV 93.4, RDW 11.4 I discussed with the patient that she has excellent blood work and does not require IV iron at this time. She is she has done well without requiring IV iron for the past 8 months, we can see her on an as-needed basis.

## 2018-02-02 NOTE — Telephone Encounter (Signed)
No 12/17 los

## 2018-02-04 ENCOUNTER — Ambulatory Visit: Payer: Self-pay | Admitting: Hematology and Oncology

## 2018-02-08 ENCOUNTER — Other Ambulatory Visit: Payer: Self-pay | Admitting: Internal Medicine

## 2018-02-08 MED FILL — HYDROCHLOROTHIAZIDE 25 MG T: 25 | 90 days supply | Qty: 90 | Fill #1

## 2018-02-08 MED FILL — DICLOFENAC SODIUM 1 % GEL: 1 | 11 days supply | Qty: 300 | Fill #1

## 2018-02-08 MED FILL — URSODIOL 500 MG TABLET: 500 | 30 days supply | Qty: 60 | Fill #0

## 2018-02-09 ENCOUNTER — Other Ambulatory Visit: Payer: Self-pay

## 2018-02-09 MED ORDER — TOFACITINIB CITRATE 10 MG PO TABS: 1.0000 | ORAL_TABLET | Freq: Every day | ORAL | 0 refills | Status: DC

## 2018-02-11 ENCOUNTER — Other Ambulatory Visit: Payer: Self-pay | Admitting: Pharmacist

## 2018-02-11 MED ORDER — TOFACITINIB CITRATE 10 MG PO TABS: 1.0000 | ORAL_TABLET | Freq: Two times a day (BID) | ORAL | 6 refills | Status: DC

## 2018-02-11 MED FILL — XELJANZ 10 MG TABS: 10 | 30 days supply | Qty: 60 | Fill #0

## 2018-02-23 DIAGNOSIS — N870 Mild cervical dysplasia: Secondary | ICD-10-CM | POA: Diagnosis not present

## 2018-02-23 DIAGNOSIS — R87610 Atypical squamous cells of undetermined significance on cytologic smear of cervix (ASC-US): Secondary | ICD-10-CM | POA: Diagnosis not present

## 2018-02-23 MED FILL — XELJANZ 10 MG TABS: 10 | 30 days supply | Qty: 60 | Fill #0

## 2018-03-01 ENCOUNTER — Encounter: Payer: Self-pay | Admitting: Pharmacist

## 2018-03-01 ENCOUNTER — Ambulatory Visit (INDEPENDENT_AMBULATORY_CARE_PROVIDER_SITE_OTHER): Payer: 59 | Admitting: Pharmacist

## 2018-03-01 DIAGNOSIS — Z79899 Other long term (current) drug therapy: Secondary | ICD-10-CM

## 2018-03-01 NOTE — Progress Notes (Signed)
   S: Patient presents for review of their specialty medication therapy.  Patient is currently taking Xeljanz for ulcerative colitis. Patient is managed by Dr. Collene Mares for this.   She was previously on Humira but failed therapy.   Adherence: denies any missed doses  Efficacy: working well with no adverse effects.  Dosing:  Ulcerative colitis, moderate to severe :Maintenance: 10 mg twice daily    Monitoring: Recent UC flares: denies S/sx of infection: denies S/sx of malignancy: denies Bone marrow suppression: denies GI adverse effects: denies Headache: denies Elevation in LFTs: denies Cardiovascular effects (decreased HR): denies   O:     Lab Results  Component Value Date   WBC 6.5 01/29/2018   HGB 13.9 01/29/2018   HCT 42.5 01/29/2018   MCV 93.4 01/29/2018   PLT 332 01/29/2018      Chemistry      Component Value Date/Time   NA 137 10/16/2017 1159   K 3.5 10/16/2017 1159   CL 101 10/16/2017 1159   CO2 27 10/16/2017 1159   BUN 13 10/16/2017 1159   CREATININE 0.97 10/16/2017 1159      Component Value Date/Time   CALCIUM 9.2 10/16/2017 1159   AST 31 (H) 10/16/2017 1159   ALT 44 (H) 10/16/2017 1159   BILITOT 1.0 10/16/2017 1159     Last labs seen on [facility]  A/P: 1. Medication review: patient currently taking Xeljanz for ulcerative colitis and is tolerating it well with no adverse effects. Reviewed the medications with the patient including the following: Xeljanz (tofacitinib) is a Janus Associated Kinase Inhibitor which prevents cytokine- or growth factor-medicated gene expression and intracellular activity of immune cells, reduces circulating CD16/56+ natural killer cells, serum IgG, IgM, IgA, and C-reactive protein, and increases B cells. Adverse effects include increased risk of infection, risk of malignancy, as well as bone marrow suppression, GI upset, and decreased heart rate. . Patient should avoid live vaccinations while on this medication. No recommendations  for changes at this time.    Christella Hartigan, PharmD, BCPS, BCACP, CPP Clinical Pharmacist Practitioner  [phone]

## 2018-03-15 MED FILL — FOLIC ACID 1 MG TABS: 1 | 90 days supply | Qty: 90 | Fill #1

## 2018-03-15 MED FILL — URSODIOL 500 MG TABLET: 500 | 30 days supply | Qty: 60 | Fill #1

## 2018-03-17 ENCOUNTER — Encounter (HOSPITAL_BASED_OUTPATIENT_CLINIC_OR_DEPARTMENT_OTHER): Payer: Self-pay

## 2018-03-17 ENCOUNTER — Emergency Department (HOSPITAL_BASED_OUTPATIENT_CLINIC_OR_DEPARTMENT_OTHER)
Admission: EM | Admit: 2018-03-17 | Discharge: 2018-03-17 | Disposition: A | Discharge status: Home / Self Care | Payer: 59 | Attending: Emergency Medicine | Admitting: Emergency Medicine

## 2018-03-17 ENCOUNTER — Other Ambulatory Visit: Payer: Self-pay

## 2018-03-17 DIAGNOSIS — R51 Headache: Secondary | ICD-10-CM | POA: Diagnosis not present

## 2018-03-17 DIAGNOSIS — Z79899 Other long term (current) drug therapy: Secondary | ICD-10-CM | POA: Insufficient documentation

## 2018-03-17 DIAGNOSIS — R519 Headache, unspecified: Secondary | ICD-10-CM

## 2018-03-17 DIAGNOSIS — B349 Viral infection, unspecified: Secondary | ICD-10-CM | POA: Insufficient documentation

## 2018-03-17 DIAGNOSIS — I10 Essential (primary) hypertension: Secondary | ICD-10-CM | POA: Insufficient documentation

## 2018-03-17 DIAGNOSIS — E039 Hypothyroidism, unspecified: Secondary | ICD-10-CM | POA: Diagnosis not present

## 2018-03-17 MED ORDER — AMLODIPINE BESYLATE 5 MG PO TABS
2.5000 mg | ORAL_TABLET | Freq: Once | ORAL | Status: AC
  Administered 2018-03-17: 2.5 mg via ORAL
  Filled 2018-03-17: qty 1

## 2018-03-17 MED ORDER — ACETAMINOPHEN 325 MG PO TABS
650.0000 mg | ORAL_TABLET | Freq: Once | ORAL | Status: AC
  Administered 2018-03-17: 650 mg via ORAL
  Filled 2018-03-17: qty 2

## 2018-03-17 MED FILL — XELJANZ 10 MG TABS: 10 | 30 days supply | Qty: 60 | Fill #1

## 2018-03-17 NOTE — ED Triage Notes (Addendum)
Pt with URI sx, HA x 2 days-pt states she was unaware she had a fever-took tylenol this am for HA-NAD-steady gait

## 2018-03-17 NOTE — Discharge Instructions (Addendum)
Suspect that you have a viral illness.  Motrin and Tylenol to help with your fever.  Have given your dose of blood pressure medication in the ED however would discuss with your primary care doctor more about change in blood pressure medications.  Drink plenty of fluids stay hydrated.  If you develop any worsening signs including difficulty speaking, drooping of your face, worsening with walking return the ED immediately.

## 2018-03-18 NOTE — ED Provider Notes (Signed)
[facility] DEPARTMENT Provider Note   CSN: [id number] Arrival date & time: 03/17/18  1910     History   Chief Complaint Chief Complaint  Patient presents with  . URI    HPI Jamie Duarte is a 38 y.o. female.  HPI 38 year old female with a past medical history significant for hypertension and ulcerative colitis presents to the emergency department today for evaluation of fever, nasal congestion, headache and high blood pressure.  Patient states for the past 2 days she has had a headache.  She also reports URI symptoms and thought that her headache was secondary to her infectious symptoms.  She states that she was not aware that she had a fever.  She states that she took her blood pressure today and it was elevated with the systolics in the 623J and patient was concerned.  She does have a history of high blood pressure and takes Toprol and hydrochlorothiazide.  She took an additional 7.5 mg of hydrochlorothiazide.  She states that her headache has persisted.  Ports the back of her head.  That has been constant for 2 days.  Denies acute onset or maximal in onset.  On arrival to triage patient was febrile.  She reports nasal congestion, rhinorrhea.  Denies any otalgia or cough.  Denies any neck pain.  She has not taken anything for pain prior to arrival.  She did try over-the-counter medications for her URI symptoms yesterday but states that she did not take pseudoephedrine.  Denies any vision changes, lightheadedness, dizziness, chest pain or shortness of breath. Past Medical History:  Diagnosis Date  . Hypertension   . Ulcerative colitis [facility])     Patient Active Problem List   Diagnosis Date Noted  . Iron deficiency anemia due to chronic blood loss 06/24/2017  . Ulcerative colitis ([place]) 03/04/2016  . Trochanteric bursitis of both hips 03/04/2016  . ANA positive 03/04/2016  . 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 High risk medication use 03/04/2016  .  Autoimmune disease ([place]) 03/04/2016  . History of hypertension 03/04/2016  . History of hypothyroidism 03/04/2016  . Urticaria 06/06/2015    Past Surgical History:  Procedure Laterality Date  . BREAST LUMPECTOMY Left 1998  . CESAREAN SECTION       OB History   No obstetric history on file.      Home Medications    Prior to Admission medications   Medication Sig Start Date End Date Taking? Authorizing Provider  diclofenac sodium (VOLTAREN) 1 % GEL Apply 3 grams to 3 large joints up to 3 times daily. 10/16/17   Ofilia Neas, PA-C  folic acid (FOLVITE) 1 MG tablet Take 1 tablet by mouth daily. 06/24/17   [provider]  hydrochlorothiazide (HYDRODIURIL) 25 MG tablet Take 25 mg by mouth daily. Reported on 06/06/2015    [provider]  metoprolol (LOPRESSOR) 50 MG tablet Take 50 mg by mouth 2 (two) times daily.    [provider]  Multiple Vitamins-Minerals (MULTIVITAMIN ADULT PO) Take 1 tablet by mouth daily.    [provider]  norethindrone (MICRONOR,CAMILA,ERRIN) 0.35 MG tablet Take 1 tablet by mouth daily.    [provider]  Tofacitinib Citrate (XELJANZ) 10 MG TABS Take 1 tablet by mouth 2 (two) times daily. 02/11/18   Tresa Garter, MD  ursodiol (ACTIGALL) 500 MG tablet Take 1 tablet (500 mg total) by mouth 2 (two) times daily. 02/02/18   Nicholas Lose, MD    Family History Family  History  Problem Relation Age of Onset  . Healthy Mother   . Cancer Mother        breast   . Healthy Father   . Healthy Brother   . Allergic rhinitis Neg Hx   . Angioedema Neg Hx   . Asthma Neg Hx   . Atopy Neg Hx   . Eczema Neg Hx   . Urticaria Neg Hx   . Immunodeficiency Neg Hx     Social History Social History   Tobacco Use  . Smoking status: Never Smoker  . Smokeless tobacco: Never Used  Substance Use Topics  . Alcohol use: Never    Frequency: Never  . Drug use: Never     Allergies   Patient has no known  allergies.   Review of Systems Review of Systems  Constitutional: Positive for chills and fever. Negative for activity change and appetite change.  HENT: Positive for congestion and rhinorrhea. Negative for ear pain and sore throat.   Eyes: Negative for photophobia and visual disturbance.  Respiratory: Negative for cough and shortness of breath.   Cardiovascular: Negative for chest pain.  Gastrointestinal: Negative for diarrhea, nausea and vomiting.  Musculoskeletal: Negative for myalgias.  Skin: Negative for rash.  Neurological: Positive for headaches. Negative for dizziness, syncope, weakness, light-headedness and numbness.     Physical Exam Updated Vital Signs BP (!) 156/105   Pulse 76   Temp 98.5 F (36.9 C) (Oral)   Resp 16   Ht 5\' 7"  (1.702 m)   Wt 84.4 kg   LMP 03/03/2018 (Approximate)   SpO2 100%   BMI 29.13 kg/m   Physical Exam Vitals signs and nursing note reviewed.  Constitutional:      General: She is not in acute distress.    Appearance: She is well-developed.  HENT:     Head: Normocephalic and atraumatic.     Right Ear: Tympanic membrane, ear canal and external ear normal. There is no impacted cerumen.     Left Ear: Ear canal and external ear normal. There is no impacted cerumen.     Nose: Congestion and rhinorrhea present.     Mouth/Throat:     Mouth: Mucous membranes are moist.     Pharynx: No oropharyngeal exudate or posterior oropharyngeal erythema.  Eyes:     General: No scleral icterus.       Right eye: No discharge.        Left eye: No discharge.     Extraocular Movements: Extraocular movements intact.     Conjunctiva/sclera: Conjunctivae normal.     Pupils: Pupils are equal, round, and reactive to light.  Neck:     Musculoskeletal: Normal range of motion and neck supple. No neck rigidity or muscular tenderness.  Cardiovascular:     Rate and Rhythm: Regular rhythm.     Pulses: Normal pulses.     Heart sounds: Normal heart sounds.   Pulmonary:     Effort: Pulmonary effort is normal. No respiratory distress.     Breath sounds: Normal breath sounds. No stridor. No wheezing, rhonchi or rales.  Chest:     Chest wall: No tenderness.  Musculoskeletal: Normal range of motion.  Lymphadenopathy:     Cervical: No cervical adenopathy.  Skin:    Capillary Refill: Capillary refill takes less than 2 seconds.     Coloration: Skin is not pale.  Neurological:     Mental Status: She is alert and oriented to person, place, and time.  Comments: The patient is alert, attentive, and oriented x 3. Speech is clear. Cranial nerve II-VII grossly intact. Negative pronator drift. Sensation intact. Strength 5/5 in all extremities. Reflexes 2+ and symmetric at biceps, triceps, knees, and ankles. Rapid alternating movement and fine finger movements intact.  Posture and gait normal.   Psychiatric:        Behavior: Behavior normal.        Thought Content: Thought content normal.        Judgment: Judgment normal.      ED Treatments / Results  Labs (all labs ordered are listed, but only abnormal results are displayed) Labs Reviewed - No data to display  EKG None  Radiology No results found.  Procedures Procedures (including critical care time)  Medications Ordered in ED Medications  acetaminophen (TYLENOL) tablet 650 mg (650 mg Oral Given 03/17/18 1927)  amLODipine (NORVASC) tablet 2.5 mg (2.5 mg Oral Given 03/17/18 2238)     Initial Impression / Assessment and Plan / ED Course  I have reviewed the triage vital signs and the nursing notes.  Pertinent labs & imaging results that were available during my care of the patient were reviewed by me and considered in my medical decision making (see chart for details).     Patient presents the ED for evaluation of URI symptoms, headache, fever and high blood pressure.  On exam patient overall well-appearing and nontoxic.  She was initially febrile true antipyretics and this has  improved.  She is not tachycardic.  She is hypertensive with a systolic be in the 703J however I did review patient's prior medical record and she has had systolic readings in the 009F.  Patient reports a slight headache.  Denies any other focal changes.  No focal neurological deficit on exam.  Denies chest pain or shortness of breath.  Low suspicion for CVA or ICH.  Doubt temporal arteritis.  No indication for advanced imaging at this time.  Suspect that patient's headache and blood pressure may be elevated to her acute illness.  Possibly sinusitis versus influenza.  Patient symptoms been present for 2 days there is no indication of antibiotics at this time.  Oropharynx is clear.  No signs of strep throat.  Patient has no nuchal rigidity concerning for meningitis.  No signs of otitis media.  Lungs clear to auscultation bilaterally.  Patient is asking for something else to help reduce her blood pressure in the ED.  Have given 2.5 of Norvasc.  Patient to call her primary care doctor tomorrow to have a discussion on the further blood pressure management.  Discussed symptomatic care at home with plenty of fluids, Motrin and Tylenol for fever.  Pt is hemodynamically stable, in NAD, & able to ambulate in the ED. Evaluation does not show pathology that would require ongoing emergent intervention or inpatient treatment. I explained the diagnosis to the patient. Pain has been managed & has no complaints prior to dc. Pt is comfortable with above plan and is stable for discharge at this time. All questions were answered prior to disposition. Strict return precautions for f/u to the ED were discussed. Encouraged follow up with PCP.  Pt dicussed with my attending Dr. Maryan Rued who is agreeable with the above plans.   Final Clinical Impressions(s) / ED Diagnoses   Final diagnoses:  Viral syndrome  Nonintractable headache, unspecified chronicity pattern, unspecified headache type  Hypertension, unspecified type    ED  Discharge Orders    None  Doristine Devoid, PA-C 03/18/18 1529    Blanchie Dessert, MD 03/20/18 2203

## 2018-03-23 NOTE — Progress Notes (Signed)
Office Visit Note  Patient: Jamie Duarte             Date of Birth: 06/15/1980           MRN: [id number]             PCP: Gaynelle Arabian, MD Referring: Gaynelle Arabian, MD Visit Date: 04/06/2018 Occupation: @GUAROCC @  Subjective:  Positive ANA    History of Present Illness: Jamie Duarte is a 38 y.o. female with history of positive ANA, positive Ro antibody.  She states she is not having any sicca symptoms or any joint pain.  She has been on [place] for ulcerative colitis.  She states her dose of allergens was reduced from 20 mg to 10 mg a day by Dr. Collene Mares about 6 months ago.  She has not noticed any flare of her symptoms.  He states she had no recurrence of right knee effusion.  Activities of Daily Living:  Patient reports morning stiffness for 0  none.   Patient Denies nocturnal pain.  Difficulty dressing/grooming: Denies Difficulty climbing stairs: Denies Difficulty getting out of chair: Denies Difficulty using hands for taps, buttons, cutlery, and/or writing: Denies  Review of Systems  Constitutional: Negative for fatigue, night sweats, weight gain and weight loss.  HENT: Negative for mouth sores, trouble swallowing, trouble swallowing, mouth dryness and nose dryness.   Eyes: Negative for pain, redness, visual disturbance and dryness.  Respiratory: Negative for cough, shortness of breath and difficulty breathing.   Cardiovascular: Negative for chest pain, palpitations, hypertension, irregular heartbeat and swelling in legs/feet.  Gastrointestinal: Negative for blood in stool, constipation and diarrhea.  Endocrine: Negative for excessive thirst and increased urination.  Genitourinary: Negative for difficulty urinating and vaginal dryness.  Musculoskeletal: Negative for arthralgias, joint pain, joint swelling, myalgias, muscle weakness, morning stiffness, muscle tenderness and myalgias.  Skin: Negative for color change, rash, hair loss, skin tightness, ulcers and  sensitivity to sunlight.  Allergic/Immunologic: Negative for susceptible to infections.  Neurological: Negative for dizziness, numbness, memory loss, night sweats and weakness.  Hematological: Negative for bruising/bleeding tendency and swollen glands.  Psychiatric/Behavioral: Negative for depressed mood and sleep disturbance. The patient is not nervous/anxious.     PMFS History:  Patient Active Problem List   Diagnosis Date Noted  . Iron deficiency anemia due to chronic blood loss 06/24/2017  . Ulcerative colitis ([place]) 03/04/2016  . Trochanteric bursitis of both hips 03/04/2016  . ANA positive 03/04/2016  . 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 High risk medication use 03/04/2016  . Autoimmune disease ([place]) 03/04/2016  . History of hypertension 03/04/2016  . History of hypothyroidism 03/04/2016  . Urticaria 06/06/2015    Past Medical History:  Diagnosis Date  . Hypertension   . Ulcerative colitis ([place])     Family History  Problem Relation Age of Onset  . Healthy Mother   . Cancer Mother        breast   . Healthy Father   . Healthy Brother   . Allergic rhinitis Neg Hx   . Angioedema Neg Hx   . Asthma Neg Hx   . Atopy Neg Hx   . Eczema Neg Hx   . Urticaria Neg Hx   . Immunodeficiency Neg Hx    Past Surgical History:  Procedure Laterality Date  . BREAST LUMPECTOMY Left 1998  . CESAREAN SECTION     Social History   Social History Narrative  . Not on file    There is no immunization history  on file for this patient.   Objective: Vital Signs: BP 132/86 (BP Location: Left Arm, Patient Position: Sitting, Cuff Size: Normal)   Pulse 64   Resp 16   Ht 5\' 7"  (1.702 m)   Wt 196 lb (88.9 kg)   LMP 02/16/2018   BMI 30.70 kg/m    Physical Exam Vitals signs and nursing note reviewed.  Constitutional:      Appearance: She is well-developed.  HENT:     Head: Normocephalic and atraumatic.  Eyes:     Conjunctiva/sclera: Conjunctivae normal.  Neck:      Musculoskeletal: Normal range of motion.  Cardiovascular:     Rate and Rhythm: Normal rate and regular rhythm.     Heart sounds: Normal heart sounds.  Pulmonary:     Effort: Pulmonary effort is normal.     Breath sounds: Normal breath sounds.  Abdominal:     General: Bowel sounds are normal.     Palpations: Abdomen is soft.  Lymphadenopathy:     Cervical: No cervical adenopathy.  Skin:    General: Skin is warm and dry.     Capillary Refill: Capillary refill takes less than 2 seconds.  Neurological:     Mental Status: She is alert and oriented to person, place, and time.  Psychiatric:        Behavior: Behavior normal.      Musculoskeletal Exam: C-spine thoracic lumbar spine good range of motion.  Shoulder joints elbow joints wrist joint MCPs PIPs DIPs with good range of motion with no synovitis.  Hip joints knee joints ankles MTPs PIPs with good range of motion with no synovitis.  CDAI Exam: CDAI Score: Not documented Patient Global Assessment: Not documented; Provider Global Assessment: Not documented Swollen: Not documented; Tender: Not documented Joint Exam   Not documented   There is currently no information documented on the homunculus. Go to the Rheumatology activity and complete the homunculus joint exam.  Investigation: No additional findings.  Imaging: No results found.  Recent Labs: Lab Results  Component Value Date   WBC 6.5 01/29/2018   HGB 13.9 01/29/2018   PLT 332 01/29/2018   NA 137 10/16/2017   K 3.5 10/16/2017   CL 101 10/16/2017   CO2 27 10/16/2017   GLUCOSE 79 10/16/2017   BUN 13 10/16/2017   CREATININE 0.97 10/16/2017   BILITOT 1.0 10/16/2017   AST 31 (H) 10/16/2017   ALT 44 (H) 10/16/2017   PROT 7.9 10/16/2017   PROT 7.9 10/16/2017   CALCIUM 9.2 10/16/2017   GFRAA 86 10/16/2017   QFTBGOLDPLUS NEGATIVE 10/16/2017    Speciality Comments: No specialty comments available.  Procedures:  No procedures performed Allergies: Patient has no  known allergies.   Assessment / Plan:     Visit Diagnoses: ANA positive - ANA positive: 1:320 Homogenous, Ro+, smith and dsDNA negative.  She continues to do well.  She denies any sicca symptoms.  She has no clinical features of Sjogren's or lupus. She has had a EKG in the past.  Anticardiolipin antibody positive - negative when rechecked on 10/16/17  Other ulcerative colitis without complication (HCC)-patient symptoms are very well controlled on Xeljanz.  She states she was on 10 mg twice daily in the past and for the last 6 months she is on 10 mg p.o. daily.  Labs have been monitored by Dr. Collene Mares.  Effusion, right knee - cortisone injection 10/16/17.  She had no recurrence of knee joint effusion.  She believes the knee joint effusion  was related to running.  She has modified her activity.  Primary osteoarthritis of both knees-joint protection muscle strengthening was discussed.  History of hypertension-her blood pressure is mildly elevated.  History of hypothyroidism   Orders: No orders of the defined types were placed in this encounter.  No orders of the defined types were placed in this encounter.     Follow-Up Instructions: Return in about 6 months (around 10/05/2018) for To ANA, positive Ro, UC.   Bo Merino, MD  Note - This record has been created using Editor, commissioning.  Chart creation errors have been sought, but may not always  have been located. Such creation errors do not reflect on  the standard of medical care.

## 2018-03-27 MED FILL — METOPROLOL SUCCINATE ER 100: 100 | 90 days supply | Qty: 90 | Fill #3

## 2018-04-06 ENCOUNTER — Encounter: Payer: Self-pay | Admitting: Physician Assistant

## 2018-04-06 ENCOUNTER — Ambulatory Visit: Payer: 59 | Admitting: Rheumatology

## 2018-04-06 VITALS — BP 132/86 | HR 64 | Resp 16 | Ht 67.0 in | Wt 196.0 lb

## 2018-04-06 DIAGNOSIS — R760 Raised antibody titer: Secondary | ICD-10-CM

## 2018-04-06 DIAGNOSIS — R768 Other specified abnormal immunological findings in serum: Secondary | ICD-10-CM

## 2018-04-06 DIAGNOSIS — M170 Bilateral primary osteoarthritis of knee: Secondary | ICD-10-CM

## 2018-04-06 DIAGNOSIS — R7689 Other specified abnormal immunological findings in serum: Secondary | ICD-10-CM

## 2018-04-06 DIAGNOSIS — K5180 Other ulcerative colitis without complications: Secondary | ICD-10-CM | POA: Diagnosis not present

## 2018-04-06 DIAGNOSIS — Z8679 Personal history of other diseases of the circulatory system: Secondary | ICD-10-CM | POA: Diagnosis not present

## 2018-04-06 DIAGNOSIS — Z8639 Personal history of other endocrine, nutritional and metabolic disease: Secondary | ICD-10-CM

## 2018-04-11 MED FILL — URSODIOL 500 MG TABLET: 500 | 30 days supply | Qty: 60 | Fill #2

## 2018-04-16 MED FILL — NORETHINDRONE 0.35 MG TAB: 0.35 | 84 days supply | Qty: 84 | Fill #2

## 2018-05-04 MED FILL — URSODIOL 500 MG TABLET: 500 | 30 days supply | Qty: 60 | Fill #3

## 2018-05-04 MED FILL — HYDROCHLOROTHIAZIDE 25 MG T: 25 | 90 days supply | Qty: 90 | Fill #2

## 2018-05-04 MED FILL — XELJANZ 10 MG TABS: 10 | 30 days supply | Qty: 60 | Fill #2

## 2018-05-05 DIAGNOSIS — H5213 Myopia, bilateral: Secondary | ICD-10-CM | POA: Diagnosis not present

## 2018-05-06 DIAGNOSIS — K8301 Primary sclerosing cholangitis: Secondary | ICD-10-CM | POA: Diagnosis not present

## 2018-05-06 DIAGNOSIS — K5190 Ulcerative colitis, unspecified, without complications: Secondary | ICD-10-CM | POA: Diagnosis not present

## 2018-05-10 ENCOUNTER — Other Ambulatory Visit: Payer: Self-pay | Admitting: Gastroenterology

## 2018-05-10 ENCOUNTER — Other Ambulatory Visit (HOSPITAL_COMMUNITY): Payer: Self-pay | Admitting: Gastroenterology

## 2018-05-10 DIAGNOSIS — R7989 Other specified abnormal findings of blood chemistry: Secondary | ICD-10-CM

## 2018-05-10 DIAGNOSIS — R945 Abnormal results of liver function studies: Principal | ICD-10-CM

## 2018-05-29 MED FILL — XELJANZ 10 MG TABS: 10 | 30 days supply | Qty: 60 | Fill #3

## 2018-05-29 MED FILL — URSODIOL 500 MG TABLET: 500 | 30 days supply | Qty: 60 | Fill #4

## 2018-05-29 MED FILL — FOLIC ACID 1 MG TABS: 1 | 90 days supply | Qty: 90 | Fill #2

## 2018-05-31 MED FILL — METOPROLOL SUCCINATE ER 100: 100 | 90 days supply | Qty: 90 | Fill #0

## 2018-06-04 ENCOUNTER — Other Ambulatory Visit: Payer: Self-pay

## 2018-06-04 ENCOUNTER — Ambulatory Visit (HOSPITAL_COMMUNITY)
Admission: RE | Admit: 2018-06-04 | Discharge: 2018-06-04 | Disposition: A | Discharge status: Home / Self Care | Payer: 59 | Source: Ambulatory Visit | Attending: Gastroenterology | Admitting: Gastroenterology

## 2018-06-04 DIAGNOSIS — R945 Abnormal results of liver function studies: Secondary | ICD-10-CM | POA: Insufficient documentation

## 2018-06-04 DIAGNOSIS — K8301 Primary sclerosing cholangitis: Secondary | ICD-10-CM | POA: Diagnosis not present

## 2018-06-04 DIAGNOSIS — R899 Unspecified abnormal finding in specimens from other organs, systems and tissues: Secondary | ICD-10-CM | POA: Diagnosis not present

## 2018-06-04 DIAGNOSIS — R7989 Other specified abnormal findings of blood chemistry: Secondary | ICD-10-CM

## 2018-06-04 DIAGNOSIS — K5190 Ulcerative colitis, unspecified, without complications: Secondary | ICD-10-CM | POA: Diagnosis not present

## 2018-06-08 ENCOUNTER — Ambulatory Visit (HOSPITAL_COMMUNITY): Payer: 59

## 2018-06-19 MED FILL — URSODIOL 500 MG TABLET: 500 | 30 days supply | Qty: 120 | Fill #0

## 2018-06-19 MED FILL — XELJANZ 10 MG TABS: 10 | 30 days supply | Qty: 60 | Fill #4

## 2018-07-07 DIAGNOSIS — I10 Essential (primary) hypertension: Secondary | ICD-10-CM | POA: Diagnosis not present

## 2018-07-07 DIAGNOSIS — K5190 Ulcerative colitis, unspecified, without complications: Secondary | ICD-10-CM | POA: Diagnosis not present

## 2018-07-07 DIAGNOSIS — Z0000 Encounter for general adult medical examination without abnormal findings: Secondary | ICD-10-CM | POA: Diagnosis not present

## 2018-07-07 DIAGNOSIS — D649 Anemia, unspecified: Secondary | ICD-10-CM | POA: Diagnosis not present

## 2018-07-07 DIAGNOSIS — M359 Systemic involvement of connective tissue, unspecified: Secondary | ICD-10-CM | POA: Diagnosis not present

## 2018-07-08 MED FILL — NORETHINDRONE 0.35 MG TAB: 0.35 | 84 days supply | Qty: 84 | Fill #3

## 2018-07-14 DIAGNOSIS — Z0000 Encounter for general adult medical examination without abnormal findings: Secondary | ICD-10-CM | POA: Diagnosis not present

## 2018-07-14 DIAGNOSIS — I10 Essential (primary) hypertension: Secondary | ICD-10-CM | POA: Diagnosis not present

## 2018-07-14 DIAGNOSIS — D649 Anemia, unspecified: Secondary | ICD-10-CM | POA: Diagnosis not present

## 2018-07-19 MED FILL — XELJANZ 10 MG TABS: 10 | 30 days supply | Qty: 60 | Fill #5

## 2018-08-02 MED FILL — HYDROCHLOROTHIAZIDE 25 MG T: 25 | 90 days supply | Qty: 90 | Fill #0

## 2018-08-04 DIAGNOSIS — K8301 Primary sclerosing cholangitis: Secondary | ICD-10-CM | POA: Diagnosis not present

## 2018-08-13 MED FILL — URSODIOL 500 MG TABLET: 500 | 30 days supply | Qty: 120 | Fill #1

## 2018-08-20 MED FILL — XELJANZ 10 MG TABS: 10 | 30 days supply | Qty: 60 | Fill #6

## 2018-08-23 MED FILL — METOPROLOL SUCCINATE ER 100: 100 | 90 days supply | Qty: 90 | Fill #1

## 2018-08-27 MED FILL — FOLIC ACID 1 MG TABS: 1 | 90 days supply | Qty: 90 | Fill #3

## 2018-09-20 ENCOUNTER — Other Ambulatory Visit: Payer: Self-pay | Admitting: Internal Medicine

## 2018-09-21 NOTE — Progress Notes (Deleted)
Office Visit Note  Patient: Jamie Duarte             Date of Birth: [birth year]           MRN: [id number]             PCP: Gaynelle Arabian, MD Referring: Gaynelle Arabian, MD Visit Date: 10/05/2018 Occupation: @GUAROCC @  Subjective:  No chief complaint on file.  Xeljanz 10 mg twice daily?  Do we manage of managed by GI? Last visit 11/10/2017  History of Present Illness: Jamie Duarte is a 38 y.o. female ***   Activities of Daily Living:  Patient reports morning stiffness for *** {minute/hour:19697}.   Patient {ACTIONS;DENIES/REPORTS:21021675::"Denies"} nocturnal pain.  Difficulty dressing/grooming: {ACTIONS;DENIES/REPORTS:21021675::"Denies"} Difficulty climbing stairs: {ACTIONS;DENIES/REPORTS:21021675::"Denies"} Difficulty getting out of chair: {ACTIONS;DENIES/REPORTS:21021675::"Denies"} Difficulty using hands for taps, buttons, cutlery, and/or writing: {ACTIONS;DENIES/REPORTS:21021675::"Denies"}  No Rheumatology ROS completed.   PMFS History:  Patient Active Problem List   Diagnosis Date Noted  . Primary osteoarthritis of both knees 04/06/2018  . Iron deficiency anemia due to chronic blood loss 06/24/2017  . Ulcerative colitis ([place]) 03/04/2016  . Trochanteric bursitis of both hips 03/04/2016  . ANA positive 03/04/2016  . 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 High risk medication use 03/04/2016  . Autoimmune disease ([place]) 03/04/2016  . History of hypertension 03/04/2016  . History of hypothyroidism 03/04/2016  . Urticaria 06/06/2015    Past Medical History:  Diagnosis Date  . Hypertension   . Ulcerative colitis ([place])     Family History  Problem Relation Age of Onset  . Healthy Mother   . Cancer Mother        breast   . Healthy Father   . Healthy Brother   . Allergic rhinitis Neg Hx   . Angioedema Neg Hx   . Asthma Neg Hx   . Atopy Neg Hx   . Eczema Neg Hx   . Urticaria Neg Hx   . Immunodeficiency Neg Hx    Past Surgical History:   Procedure Laterality Date  . BREAST LUMPECTOMY Left 1998  . CESAREAN SECTION     Social History   Social History Narrative  . Not on file    There is no immunization history on file for this patient.   Objective: Vital Signs: There were no vitals taken for this visit.   Physical Exam   Musculoskeletal Exam: ***  CDAI Exam: CDAI Score: - Patient Global: -; Provider Global: - Swollen: -; Tender: - Joint Exam   No joint exam has been documented for this visit   There is currently no information documented on the homunculus. Go to the Rheumatology activity and complete the homunculus joint exam.  Investigation: No additional findings.  Imaging: No results found.  Recent Labs: Lab Results  Component Value Date   WBC 6.5 01/29/2018   HGB 13.9 01/29/2018   PLT 332 01/29/2018   NA 137 10/16/2017   K 3.5 10/16/2017   CL 101 10/16/2017   CO2 27 10/16/2017   GLUCOSE 79 10/16/2017   BUN 13 10/16/2017   CREATININE 0.97 10/16/2017   BILITOT 1.0 10/16/2017   AST 31 (H) 10/16/2017   ALT 44 (H) 10/16/2017   PROT 7.9 10/16/2017   PROT 7.9 10/16/2017   CALCIUM 9.2 10/16/2017   GFRAA 86 10/16/2017   QFTBGOLDPLUS NEGATIVE 10/16/2017    Speciality Comments: No specialty comments available.  Procedures:  No procedures performed Allergies: Patient has no known allergies.   Assessment / Plan:  Visit Diagnoses: No diagnosis found.  Orders: No orders of the defined types were placed in this encounter.  No orders of the defined types were placed in this encounter.   Face-to-face time spent with patient was *** minutes. Greater than 50% of time was spent in counseling and coordination of care.  Follow-Up Instructions: No follow-ups on file.   Ofilia Neas, PA-C  Note - This record has been created using Dragon software.  Chart creation errors have been sought, but may not always  have been located. Such creation errors do not reflect on  the standard of  medical care.

## 2018-09-23 MED FILL — XELJANZ 10 MG TABS: 10 | 30 days supply | Qty: 60 | Fill #0

## 2018-10-05 ENCOUNTER — Ambulatory Visit: Payer: Self-pay | Admitting: Physician Assistant

## 2018-10-05 DIAGNOSIS — Z1231 Encounter for screening mammogram for malignant neoplasm of breast: Secondary | ICD-10-CM | POA: Diagnosis not present

## 2018-10-05 DIAGNOSIS — Z803 Family history of malignant neoplasm of breast: Secondary | ICD-10-CM | POA: Diagnosis not present

## 2018-10-05 MED FILL — NORETHINDRONE 0.35 MG TAB: 0.35 | 84 days supply | Qty: 84 | Fill #0

## 2018-10-14 MED FILL — URSODIOL 500 MG TABLET: 500 | 30 days supply | Qty: 120 | Fill #2

## 2018-11-22 MED FILL — METOPROLOL SUCCINATE ER 100: 100 | 90 days supply | Qty: 90 | Fill #2

## 2018-11-25 MED FILL — FOLIC ACID 1 MG TABS: 1 | 90 days supply | Qty: 90 | Fill #4

## 2018-12-03 MED FILL — URSODIOL 500 MG TABLET: 500 | 30 days supply | Qty: 120 | Fill #3

## 2018-12-09 DIAGNOSIS — K8309 Other cholangitis: Secondary | ICD-10-CM | POA: Diagnosis not present

## 2018-12-09 DIAGNOSIS — K5190 Ulcerative colitis, unspecified, without complications: Secondary | ICD-10-CM | POA: Diagnosis not present

## 2018-12-09 DIAGNOSIS — M25569 Pain in unspecified knee: Secondary | ICD-10-CM | POA: Diagnosis not present

## 2018-12-14 ENCOUNTER — Other Ambulatory Visit (HOSPITAL_COMMUNITY): Payer: Self-pay | Admitting: Gastroenterology

## 2018-12-14 ENCOUNTER — Other Ambulatory Visit: Payer: Self-pay | Admitting: Gastroenterology

## 2018-12-14 DIAGNOSIS — R748 Abnormal levels of other serum enzymes: Secondary | ICD-10-CM

## 2018-12-27 ENCOUNTER — Other Ambulatory Visit: Payer: Self-pay

## 2018-12-27 ENCOUNTER — Ambulatory Visit (HOSPITAL_COMMUNITY)
Admission: RE | Admit: 2018-12-27 | Discharge: 2018-12-27 | Disposition: A | Discharge status: Home / Self Care | Payer: 59 | Source: Ambulatory Visit | Attending: Gastroenterology | Admitting: Gastroenterology

## 2018-12-27 DIAGNOSIS — R748 Abnormal levels of other serum enzymes: Secondary | ICD-10-CM | POA: Insufficient documentation

## 2018-12-27 DIAGNOSIS — K838 Other specified diseases of biliary tract: Secondary | ICD-10-CM | POA: Diagnosis not present

## 2018-12-27 DIAGNOSIS — R935 Abnormal findings on diagnostic imaging of other abdominal regions, including retroperitoneum: Secondary | ICD-10-CM | POA: Diagnosis not present

## 2018-12-27 LAB — POCT I-STAT CREATININE: Creatinine, Ser: 1 mg/dL (ref 0.44–1.00)

## 2018-12-27 MED ORDER — GADOBUTROL 1 MMOL/ML IV SOLN
7.5000 mL | Freq: Once | INTRAVENOUS | Status: AC | PRN
  Administered 2018-12-27: 7.5 mL via INTRAVENOUS

## 2018-12-28 ENCOUNTER — Other Ambulatory Visit (HOSPITAL_COMMUNITY): Payer: Self-pay | Admitting: Gastroenterology

## 2018-12-28 DIAGNOSIS — R748 Abnormal levels of other serum enzymes: Secondary | ICD-10-CM

## 2018-12-30 DIAGNOSIS — K5190 Ulcerative colitis, unspecified, without complications: Secondary | ICD-10-CM | POA: Diagnosis not present

## 2018-12-30 DIAGNOSIS — K8301 Primary sclerosing cholangitis: Secondary | ICD-10-CM | POA: Diagnosis not present

## 2018-12-30 MED FILL — PEG-3350 SOLUTION: 420 | 1 days supply | Qty: 4000 | Fill #0

## 2018-12-30 MED FILL — NORETHINDRONE 0.35 MG TAB: 0.35 | 84 days supply | Qty: 84 | Fill #1

## 2019-01-04 MED FILL — URSODIOL 500 MG TABLET: 500 | 30 days supply | Qty: 120 | Fill #4

## 2019-01-20 DIAGNOSIS — K8301 Primary sclerosing cholangitis: Secondary | ICD-10-CM | POA: Diagnosis not present

## 2019-02-07 DIAGNOSIS — K5190 Ulcerative colitis, unspecified, without complications: Secondary | ICD-10-CM | POA: Diagnosis not present

## 2019-02-07 DIAGNOSIS — Z1211 Encounter for screening for malignant neoplasm of colon: Secondary | ICD-10-CM | POA: Diagnosis not present

## 2019-02-14 MED FILL — XELJANZ 10 MG TABS: 10 | 30 days supply | Qty: 60 | Fill #1

## 2019-02-14 MED FILL — HYDROCHLOROTHIAZIDE 25 MG T: 25 | 90 days supply | Qty: 90 | Fill #1

## 2019-02-14 MED FILL — URSODIOL 500 MG TABLET: 500 | 30 days supply | Qty: 120 | Fill #5

## 2019-02-14 MED FILL — METOPROLOL SUCCINATE ER 100: 100 | 90 days supply | Qty: 90 | Fill #0

## 2019-02-21 ENCOUNTER — Other Ambulatory Visit: Payer: Self-pay

## 2019-02-21 ENCOUNTER — Ambulatory Visit (HOSPITAL_BASED_OUTPATIENT_CLINIC_OR_DEPARTMENT_OTHER): Payer: 59 | Admitting: Pharmacist

## 2019-02-21 DIAGNOSIS — Z79899 Other long term (current) drug therapy: Secondary | ICD-10-CM

## 2019-02-21 MED ORDER — XELJANZ 10 MG PO TABS: 1.0000 | ORAL_TABLET | Freq: Two times a day (BID) | ORAL | 5 refills | Status: DC

## 2019-02-21 NOTE — Progress Notes (Signed)
Virtual Visit via Telephone Note  I connected withKimberly Osborneon1/4/2021at3:30PMby telephonedue to the COVID-19 pandemic and verified that I am speaking with the correct person using two identifiers.  Consent: I discussed the limitations, risks, security and privacy concerns of performing an evaluation and management service by telephone and the availability of in person appointments.Pt was agreeable to proceed.  Location of Patient: Home  Location of Provider: Clinic  Persons participating in Telemedicine visit: Patient Myself  S: Patient presents for review of their specialty medication therapy.  Patient is currently taking Xeljanz for ulcerative colitis. Patient is managed by Dr. Collene Mares for this.   She was previously on Humira but failed therapy.   Adherence: denies any missed doses  Efficacy: pt reports recent increase in inflammation/flares noted and monitored by Dr. Collene Mares. She was taking 10 mg daily but reports that she was instructed to increase to 10 mg BID d/t this.   Dosing:  Ulcerative colitis, moderate to severe :Maintenance: 10 mg twice daily   Monitoring: Recent UC flares: reports, see above S/sx of infection: denies S/sx of malignancy: denies Bone marrow suppression: denies GI adverse effects: denies Headache: denies Elevation in LFTs: denies Cardiovascular effects (decreased HR): denies  O:   Lab Results  Component Value Date   WBC 6.5 01/29/2018   HGB 13.9 01/29/2018   HCT 42.5 01/29/2018   MCV 93.4 01/29/2018   PLT 332 01/29/2018      Chemistry      Component Value Date/Time   NA 137 10/16/2017 1159   K 3.5 10/16/2017 1159   CL 101 10/16/2017 1159   CO2 27 10/16/2017 1159   BUN 13 10/16/2017 1159   CREATININE 1.00 12/27/2018 0834   CREATININE 0.97 10/16/2017 1159      Component Value Date/Time   CALCIUM 9.2 10/16/2017 1159   AST 31 (H) 10/16/2017 1159   ALT 44 (H) 10/16/2017 1159   BILITOT 1.0 10/16/2017 1159      Last labs seen on [facility]  A/P: 1. Medication review: patient currently taking Xeljanz for ulcerative colitis and is tolerating it well with no adverse effects. Reviewed the medications with the patient including the following: Xeljanz (tofacitinib) is a Janus Associated Kinase Inhibitor which prevents cytokine- or growth factor-medicated gene expression and intracellular activity of immune cells, reduces circulating CD16/56+ natural killer cells, serum IgG, IgM, IgA, and C-reactive protein, and increases B cells. Adverse effects include increased risk of infection, risk of malignancy, as well as bone marrow suppression, GI upset, and decreased heart rate. . Patient should avoid live vaccinations while on this medication. Her flares are being monitored by Dr. Collene Mares, who recently instructed patient to increase back to 10 mg po BID. No recommendations for further changes at this time.   Benard Halsted, PharmD, [place] [phone]

## 2019-02-24 MED FILL — FOLIC ACID 1 MG TABS: 1 | 90 days supply | Qty: 90 | Fill #0

## 2019-03-14 MED FILL — XELJANZ 10 MG TABS: 10 | 30 days supply | Qty: 60 | Fill #0

## 2019-03-15 MED FILL — NORETHINDRONE 0.35 MG TAB: 0.35 | 84 days supply | Qty: 84 | Fill #0

## 2019-03-25 MED FILL — URSODIOL 500 MG TABLET: 500 | 30 days supply | Qty: 120 | Fill #6

## 2019-04-12 MED FILL — XELJANZ 10 MG TABS: 10 | 30 days supply | Qty: 60 | Fill #1

## 2019-04-18 ENCOUNTER — Other Ambulatory Visit (HOSPITAL_COMMUNITY): Payer: Self-pay | Admitting: Gastroenterology

## 2019-04-18 DIAGNOSIS — K8301 Primary sclerosing cholangitis: Secondary | ICD-10-CM

## 2019-04-21 MED FILL — URSODIOL 500 MG TABLET: 500 | 30 days supply | Qty: 120 | Fill #7

## 2019-04-27 ENCOUNTER — Other Ambulatory Visit: Payer: Self-pay | Admitting: Radiology

## 2019-04-29 ENCOUNTER — Ambulatory Visit (HOSPITAL_COMMUNITY): Admission: RE | Admit: 2019-04-29 | Payer: Self-pay | Source: Ambulatory Visit

## 2019-04-29 ENCOUNTER — Encounter (HOSPITAL_COMMUNITY): Payer: Self-pay

## 2019-05-06 MED FILL — XELJANZ 10 MG TABS: 10 | 30 days supply | Qty: 60 | Fill #2

## 2019-05-19 MED FILL — FOLIC ACID 1 MG TABS: 1 | 90 days supply | Qty: 90 | Fill #1

## 2019-05-23 MED FILL — URSODIOL 500 MG TABLET: 500 | 30 days supply | Qty: 120 | Fill #8

## 2019-05-24 MED FILL — HYDROCHLOROTHIAZIDE 25 MG T: 25 | 90 days supply | Qty: 90 | Fill #2

## 2019-06-02 MED FILL — XELJANZ 10 MG TABS: 10 | 30 days supply | Qty: 60 | Fill #3

## 2019-06-07 MED FILL — NORETHINDRONE 0.35 MG TAB: 0.35 | 84 days supply | Qty: 84 | Fill #1

## 2019-06-12 MED FILL — METOPROLOL SUCCINATE ER 100: 100 | 90 days supply | Qty: 90 | Fill #1

## 2019-06-21 MED FILL — URSODIOL 500 MG TABLET: 500 | 30 days supply | Qty: 120 | Fill #0

## 2019-07-29 ENCOUNTER — Other Ambulatory Visit (HOSPITAL_COMMUNITY): Payer: Self-pay | Admitting: Family Medicine

## 2019-08-06 ENCOUNTER — Ambulatory Visit: Payer: Self-pay | Attending: Internal Medicine

## 2019-08-06 DIAGNOSIS — Z23 Encounter for immunization: Secondary | ICD-10-CM

## 2019-08-06 NOTE — Progress Notes (Signed)
   Covid-19 Vaccination Clinic  Name:  Jamie Duarte    MRN: [id number] DOB: Oct 12, 1980  08/06/2019  [name] was observed post Covid-19 immunization for 15 minutes without incident. She was provided with Vaccine Information Sheet and instruction to access the V-Safe system.   [name] was instructed to call 911 with any severe reactions post vaccine: Marland Kitchen Difficulty breathing  . Swelling of face and throat  . A fast heartbeat  . A bad rash all over body  . Dizziness and weakness   Immunizations Administered    Name Date Dose VIS Date Route   Pfizer COVID-19 Vaccine 08/06/2019  9:55 AM 0.3 mL 04/13/2018 Intramuscular   Manufacturer: [place]   Lot: [id number]   [place]: [id number]

## 2019-08-17 MED FILL — FOLIC ACID 1 MG TABS: 1 | 90 days supply | Qty: 90 | Fill #2

## 2019-08-22 ENCOUNTER — Other Ambulatory Visit: Payer: Self-pay | Admitting: Internal Medicine

## 2019-08-22 ENCOUNTER — Other Ambulatory Visit: Payer: Self-pay | Admitting: Pharmacist

## 2019-08-22 MED ORDER — XELJANZ 10 MG PO TABS: 1.0000 | ORAL_TABLET | Freq: Two times a day (BID) | ORAL | 4 refills | Status: DC

## 2019-08-22 MED FILL — XELJANZ 10 MG TABS: 10 | 30 days supply | Qty: 60 | Fill #0

## 2019-08-27 ENCOUNTER — Ambulatory Visit: Payer: Self-pay | Attending: Internal Medicine

## 2019-08-27 DIAGNOSIS — Z23 Encounter for immunization: Secondary | ICD-10-CM

## 2019-08-27 MED FILL — HYDROCHLOROTHIAZIDE 25 MG T: 25 | 90 days supply | Qty: 90 | Fill #0

## 2019-08-27 MED FILL — URSODIOL 500 MG TABLET: 500 | 30 days supply | Qty: 120 | Fill #2

## 2019-08-27 MED FILL — NORETHINDRONE 0.35 MG TAB: 0.35 | 84 days supply | Qty: 84 | Fill #2

## 2019-08-27 NOTE — Progress Notes (Signed)
   Covid-19 Vaccination Clinic  Name:  Jamie Duarte    MRN: [id number] DOB: 12-30-1980  08/27/2019  Ms. Maxwell Caul was observed post Covid-19 immunization for 15 minutes without incident. She was provided with Vaccine Information Sheet and instruction to access the V-Safe system.   Ms. Maxwell Caul was instructed to call 911 with any severe reactions post vaccine: Marland Kitchen Difficulty breathing  . Swelling of face and throat  . A fast heartbeat  . A bad rash all over body  . Dizziness and weakness   Immunizations Administered    Name Date Dose VIS Date Route   Pfizer COVID-19 Vaccine 08/27/2019  9:27 AM 0.3 mL 04/13/2018 Intramuscular   Manufacturer: [employer]   Lot: [id number]   [place]: [id number]

## 2019-09-12 MED FILL — METOPROLOL SUCCINATE ER 100: 100 | 90 days supply | Qty: 90 | Fill #0

## 2019-09-19 MED FILL — XELJANZ 10 MG TABS: 10 | 30 days supply | Qty: 60 | Fill #1

## 2019-09-20 DIAGNOSIS — R748 Abnormal levels of other serum enzymes: Secondary | ICD-10-CM | POA: Diagnosis not present

## 2019-09-23 ENCOUNTER — Other Ambulatory Visit (HOSPITAL_COMMUNITY): Payer: Self-pay | Admitting: Gastroenterology

## 2019-09-23 MED FILL — URSODIOL 300 MG CAPSULE: 300 | 30 days supply | Qty: 120 | Fill #0

## 2019-09-30 MED FILL — URSODIOL 300 MG CAPS: 300 | 30 days supply | Qty: 120 | Fill #0

## 2019-10-17 MED FILL — XELJANZ 10 MG TABS: 10 | 30 days supply | Qty: 60 | Fill #2

## 2019-10-31 MED FILL — URSODIOL 300 MG CAPS: 300 | 30 days supply | Qty: 120 | Fill #1

## 2019-11-14 MED FILL — XELJANZ 10 MG TABS: 10 | 30 days supply | Qty: 60 | Fill #3

## 2019-11-15 MED FILL — FOLIC ACID 1 MG TABS: 1 | 90 days supply | Qty: 90 | Fill #3

## 2019-11-17 DIAGNOSIS — R748 Abnormal levels of other serum enzymes: Secondary | ICD-10-CM | POA: Diagnosis not present

## 2019-11-17 MED FILL — NORETHINDRONE 0.35 MG TAB: 0.35 | 84 days supply | Qty: 84 | Fill #3

## 2019-11-17 MED FILL — HYDROCHLOROTHIAZIDE 25 MG T: 25 | 90 days supply | Qty: 90 | Fill #1

## 2019-11-25 ENCOUNTER — Other Ambulatory Visit (HOSPITAL_COMMUNITY): Payer: Self-pay | Admitting: Gastroenterology

## 2019-11-25 ENCOUNTER — Other Ambulatory Visit: Payer: Self-pay | Admitting: Gastroenterology

## 2019-11-25 DIAGNOSIS — K743 Primary biliary cirrhosis: Secondary | ICD-10-CM

## 2019-12-05 MED FILL — URSODIOL 300 MG CAPS: 300 | 30 days supply | Qty: 120 | Fill #2

## 2019-12-06 MED FILL — METOPROLOL SUCCINATE ER 100: 100 | 90 days supply | Qty: 90 | Fill #1

## 2019-12-08 ENCOUNTER — Ambulatory Visit (HOSPITAL_COMMUNITY): Admission: RE | Admit: 2019-12-08 | Payer: No Typology Code available for payment source | Source: Ambulatory Visit

## 2019-12-12 MED FILL — URSODIOL 300 MG CAPS: 300 | 6 days supply | Qty: 24 | Fill #3

## 2019-12-16 ENCOUNTER — Ambulatory Visit (HOSPITAL_COMMUNITY)
Admission: RE | Admit: 2019-12-16 | Discharge: 2019-12-16 | Disposition: A | Discharge status: Home / Self Care | Payer: No Typology Code available for payment source | Source: Ambulatory Visit | Attending: Gastroenterology | Admitting: Gastroenterology

## 2019-12-16 ENCOUNTER — Other Ambulatory Visit (HOSPITAL_COMMUNITY): Payer: Self-pay | Admitting: Gastroenterology

## 2019-12-16 DIAGNOSIS — K743 Primary biliary cirrhosis: Secondary | ICD-10-CM

## 2019-12-16 MED ORDER — GADOBUTROL 1 MMOL/ML IV SOLN
9.0000 mL | Freq: Once | INTRAVENOUS | Status: AC | PRN
  Administered 2019-12-16: 9 mL via INTRAVENOUS

## 2019-12-22 MED FILL — XELJANZ 10 MG TABS: 10 | 30 days supply | Qty: 60 | Fill #4

## 2020-01-03 ENCOUNTER — Other Ambulatory Visit: Payer: Self-pay | Admitting: Pharmacist

## 2020-01-03 MED ORDER — XELJANZ 10 MG PO TABS: 1.0000 | ORAL_TABLET | Freq: Two times a day (BID) | ORAL | 4 refills | Status: DC

## 2020-01-13 MED FILL — URSODIOL 300 MG CAPS: 300 | 30 days supply | Qty: 120 | Fill #4

## 2020-01-17 MED FILL — XELJANZ 10 MG TABS: 10 | 30 days supply | Qty: 60 | Fill #0

## 2020-01-30 ENCOUNTER — Other Ambulatory Visit (HOSPITAL_COMMUNITY): Payer: Self-pay | Admitting: Gastroenterology

## 2020-01-30 MED FILL — CLENPIQ 10-3.5-12 MG-GM -GM: 10-3.5-12 M | 1 days supply | Qty: 160 | Fill #0

## 2020-02-13 MED FILL — FOLIC ACID 1 MG TABS: 1 | 90 days supply | Qty: 90 | Fill #4

## 2020-02-14 MED FILL — XELJANZ 10 MG TABS: 10 | 30 days supply | Qty: 60 | Fill #1

## 2020-02-15 MED FILL — URSODIOL 300 MG CAPS: 300 | 30 days supply | Qty: 120 | Fill #5

## 2020-02-19 ENCOUNTER — Other Ambulatory Visit (HOSPITAL_COMMUNITY): Payer: Self-pay | Admitting: Obstetrics and Gynecology

## 2020-02-20 MED FILL — NORETHINDRONE 0.35 MG TABS: 0.35 | 84 days supply | Qty: 84 | Fill #0

## 2020-02-24 MED FILL — METOPROLOL SUCCINATE ER 100: 100 | 90 days supply | Qty: 90 | Fill #2

## 2020-02-24 MED FILL — HYDROCHLOROTHIAZIDE 25 MG T: 25 | 90 days supply | Qty: 90 | Fill #2

## 2020-03-06 ENCOUNTER — Telehealth: Payer: Self-pay | Admitting: Pharmacist

## 2020-03-06 NOTE — Telephone Encounter (Signed)
Called patient to schedule an appointment for the [employer] [facility]. I was unable to reach the patient so I left a HIPAA-compliant message requesting that the patient return my call.   Luke Van Ausdall, PharmD, BCACP, CPP Clinical Pharmacist [facility] [phone]  

## 2020-03-07 ENCOUNTER — Other Ambulatory Visit: Payer: Self-pay

## 2020-03-07 ENCOUNTER — Ambulatory Visit (HOSPITAL_BASED_OUTPATIENT_CLINIC_OR_DEPARTMENT_OTHER): Payer: No Typology Code available for payment source | Admitting: Pharmacist

## 2020-03-07 DIAGNOSIS — Z79899 Other long term (current) drug therapy: Secondary | ICD-10-CM

## 2020-03-07 NOTE — Progress Notes (Signed)
S: Patient presents for review of their specialty medication therapy.  Patient is currently taking Xeljanz for ulcerative colitis. Patient is managed by Dr. Collene Mares for this.   She was previously on Humira but failed therapy.   Adherence: denies any missed doses  Efficacy: reports that this is working well for her.   Dosing:  Ulcerative colitis, moderate to severe :Maintenance: 10 mg twice daily   Monitoring: Recent UC flares: denies  S/sx of infection: denies S/sx of malignancy: denies Bone marrow suppression: denies GI adverse effects: denies Headache: denies Elevation in LFTs: denies Cardiovascular effects (decreased HR): denies  O: Lab Results  Component Value Date   WBC 6.5 01/29/2018   HGB 13.9 01/29/2018   HCT 42.5 01/29/2018   MCV 93.4 01/29/2018   PLT 332 01/29/2018      Chemistry      Component Value Date/Time   NA 137 10/16/2017 1159   K 3.5 10/16/2017 1159   CL 101 10/16/2017 1159   CO2 27 10/16/2017 1159   BUN 13 10/16/2017 1159   CREATININE 1.00 12/27/2018 0834   CREATININE 0.97 10/16/2017 1159      Component Value Date/Time   CALCIUM 9.2 10/16/2017 1159   AST 31 (H) 10/16/2017 1159   ALT 44 (H) 10/16/2017 1159   BILITOT 1.0 10/16/2017 1159     Last labs seen on [facility]  A/P: 1. Medication review: patient currently taking Xeljanz for ulcerative colitis and is tolerating it well with no adverse effects. Reviewed the medications with the patient including the following: Xeljanz (tofacitinib) is a Janus Associated Kinase Inhibitor which prevents cytokine- or growth factor-medicated gene expression and intracellular activity of immune cells, reduces circulating CD16/56+ natural killer cells, serum IgG, IgM, IgA, and C-reactive protein, and increases B cells. Adverse effects include increased risk of infection, risk of malignancy, as well as bone marrow suppression, GI upset, and decreased heart rate. . Patient should avoid live vaccinations while on this  medication. Her flares are being monitored by Dr. Collene Mares, who recently instructed patient to increase back to 10 mg po BID. No recommendations for further changes at this time.   Benard Halsted, PharmD, [place], [place] [phone]

## 2020-03-13 MED FILL — HYDROCHLOROTHIAZIDE 25 MG T: 25 | 90 days supply | Qty: 90 | Fill #2

## 2020-03-14 MED FILL — XELJANZ 10 MG TABS: 10 | 30 days supply | Qty: 60 | Fill #2

## 2020-03-15 MED FILL — URSODIOL 300 MG CAPS: 300 | 30 days supply | Qty: 120 | Fill #6

## 2020-03-19 ENCOUNTER — Other Ambulatory Visit (HOSPITAL_COMMUNITY): Payer: Self-pay | Admitting: Gastroenterology

## 2020-03-19 DIAGNOSIS — K743 Primary biliary cirrhosis: Secondary | ICD-10-CM

## 2020-03-19 DIAGNOSIS — R748 Abnormal levels of other serum enzymes: Secondary | ICD-10-CM

## 2020-03-20 ENCOUNTER — Encounter (HOSPITAL_COMMUNITY): Payer: Self-pay | Admitting: Radiology

## 2020-03-20 NOTE — Progress Notes (Signed)
Jamie Duarte. Jamie Duarte Female, 40 y.o., 1980-04-30  MRN:  [id number] Phone:  [phone] (M) ...       PCP:  Gaynelle Arabian, MD Coverage:  [employer]            RE: US Liver Biopsy Received: Today Greggory Keen, MD  Arlyn Leak for [place] random liver core bx   TS        Previous Messages   ----- Message -----  From: Garth Bigness D  Sent: 03/19/2020  3:32 PM EST  To: [facility] Procedure Requests  Subject: US Liver Biopsy                  Procedure: US Liver Biopsy   Reason: Primary biliary cholangitis, not sure if this diagnosis is considered random or not but I know that Elevated alkaline phosphatase level is random   History: MR in computer   Provider: Juanita Craver   Provider Contact: [phone]

## 2020-03-30 ENCOUNTER — Other Ambulatory Visit: Payer: Self-pay | Admitting: Student

## 2020-03-31 ENCOUNTER — Other Ambulatory Visit (HOSPITAL_COMMUNITY): Payer: Self-pay | Admitting: Obstetrics and Gynecology

## 2020-04-02 ENCOUNTER — Ambulatory Visit (HOSPITAL_COMMUNITY)
Admission: RE | Admit: 2020-04-02 | Discharge: 2020-04-02 | Disposition: A | Discharge status: Home / Self Care | Payer: No Typology Code available for payment source | Source: Ambulatory Visit

## 2020-04-02 ENCOUNTER — Ambulatory Visit (HOSPITAL_COMMUNITY)
Admission: RE | Admit: 2020-04-02 | Discharge: 2020-04-02 | Disposition: A | Discharge status: Home / Self Care | Payer: No Typology Code available for payment source | Source: Ambulatory Visit | Attending: Gastroenterology | Admitting: Gastroenterology

## 2020-04-02 ENCOUNTER — Other Ambulatory Visit: Payer: Self-pay

## 2020-04-02 ENCOUNTER — Encounter (HOSPITAL_COMMUNITY): Payer: Self-pay

## 2020-04-02 DIAGNOSIS — K743 Primary biliary cirrhosis: Secondary | ICD-10-CM | POA: Insufficient documentation

## 2020-04-02 DIAGNOSIS — R748 Abnormal levels of other serum enzymes: Secondary | ICD-10-CM

## 2020-04-02 LAB — CBC
HCT: 41 % (ref 36.0–46.0)
Hemoglobin: 13.5 g/dL (ref 12.0–15.0)
MCH: 31.2 pg (ref 26.0–34.0)
MCHC: 32.9 g/dL (ref 30.0–36.0)
MCV: 94.7 fL (ref 80.0–100.0)
Platelets: 283 10*3/uL (ref 150–400)
RBC: 4.33 MIL/uL (ref 3.87–5.11)
RDW: 11.7 % (ref 11.5–15.5)
WBC: 4.8 10*3/uL (ref 4.0–10.5)
nRBC: 0 % (ref 0.0–0.2)

## 2020-04-02 LAB — PROTIME-INR
INR: 0.9 (ref 0.8–1.2)
Prothrombin Time: 11.8 seconds (ref 11.4–15.2)

## 2020-04-02 LAB — HCG, SERUM, QUALITATIVE: Preg, Serum: NEGATIVE

## 2020-04-02 MED ORDER — GELATIN ABSORBABLE 12-7 MM EX MISC
CUTANEOUS | Status: AC
  Filled 2020-04-02: qty 1

## 2020-04-02 MED ORDER — LIDOCAINE HCL 1 % IJ SOLN
INTRAMUSCULAR | Status: AC
  Filled 2020-04-02: qty 20

## 2020-04-02 MED ORDER — FENTANYL CITRATE (PF) 100 MCG/2ML IJ SOLN
25.0000 ug | Freq: Once | INTRAMUSCULAR | Status: AC
  Administered 2020-04-02: 25 ug via INTRAVENOUS

## 2020-04-02 MED ORDER — MIDAZOLAM HCL 2 MG/2ML IJ SOLN
INTRAMUSCULAR | Status: AC
  Filled 2020-04-02: qty 4

## 2020-04-02 MED ORDER — MIDAZOLAM HCL 2 MG/2ML IJ SOLN
INTRAMUSCULAR | Status: AC | PRN
  Administered 2020-04-02 (×3): 1 mg via INTRAVENOUS

## 2020-04-02 MED ORDER — FENTANYL CITRATE (PF) 100 MCG/2ML IJ SOLN
INTRAMUSCULAR | Status: AC
  Filled 2020-04-02: qty 2

## 2020-04-02 MED ORDER — SODIUM CHLORIDE 0.9 % IV SOLN: INTRAVENOUS | Status: DC

## 2020-04-02 MED ORDER — FENTANYL CITRATE (PF) 100 MCG/2ML IJ SOLN
INTRAMUSCULAR | Status: AC | PRN
Start: 2020-04-02 — End: 2020-04-02
  Administered 2020-04-02 (×2): 50 ug via INTRAVENOUS

## 2020-04-02 MED ORDER — MIDAZOLAM HCL 2 MG/2ML IJ SOLN
INTRAMUSCULAR | Status: AC | PRN
Start: 2020-04-02 — End: 2020-04-02
  Administered 2020-04-02: 1 mg via INTRAVENOUS

## 2020-04-02 MED ORDER — LIDOCAINE HCL (PF) 1 % IJ SOLN
INTRAMUSCULAR | Status: AC | PRN
  Administered 2020-04-02: 10 mL via INTRADERMAL

## 2020-04-02 MED FILL — METRONIDAZOLE 500 MG TABS: 500 | 7 days supply | Qty: 14 | Fill #0

## 2020-04-02 NOTE — Consult Note (Signed)
Chief Complaint: Patient was seen in consultation today for image guided random liver biopsy  Referring Physician(s): Mann,Jyothi  Supervising Physician: Jacqulynn Cadet  Patient Status: [facility] - Out-pt  History of Present Illness: Jamie Duarte is a 40 y.o. female with PMH hypertension, ulcerative colitis and primary sclerosing cholangitis/elevated alk phos level who presents today for image guided random liver biopsy for further evaluation. She has had prior liver bx in [place] in 2005.   Past Medical History:  Diagnosis Date  . Hypertension   . Ulcerative colitis [facility])     Past Surgical History:  Procedure Laterality Date  . BREAST LUMPECTOMY Left 1998  . CESAREAN SECTION      Allergies: Patient has no known allergies.  Medications: Prior to Admission medications   Medication Sig Start Date End Date Taking? Authorizing Provider  diclofenac sodium (VOLTAREN) 1 % GEL Apply 3 grams to 3 large joints up to 3 times daily. 10/16/17   Ofilia Neas, PA-C  folic acid (FOLVITE) 1 MG tablet Take 1 tablet by mouth daily. 06/24/17   [provider]  hydrochlorothiazide (HYDRODIURIL) 25 MG tablet Take 25 mg by mouth daily. Reported on 06/06/2015    [provider]  metoprolol succinate (TOPROL-XL) 100 MG 24 hr tablet  03/27/18   [provider]  Multiple Vitamins-Minerals (MULTIVITAMIN ADULT PO) Take 1 tablet by mouth daily.    [provider]  norethindrone (MICRONOR,CAMILA,ERRIN) 0.35 MG tablet Take 1 tablet by mouth daily.    [provider]  Tofacitinib Citrate (XELJANZ) 10 MG TABS Take 1 tablet by mouth 2 (two) times daily. 01/03/20   Tresa Garter, MD  ursodiol (ACTIGALL) 500 MG tablet Take 1 tablet (500 mg total) by mouth 2 (two) times daily. 02/02/18   Nicholas Lose, MD     Family History  Problem Relation Age of Onset  . Healthy Mother   . Cancer Mother        breast   . Healthy Father   . Healthy Brother   .  Allergic rhinitis Neg Hx   . Angioedema Neg Hx   . Asthma Neg Hx   . Atopy Neg Hx   . Eczema Neg Hx   . Urticaria Neg Hx   . Immunodeficiency Neg Hx     Social History   Socioeconomic History  . Marital status: Single    Spouse name: Not on file  . Number of children: Not on file  . Years of education: Not on file  . Highest education level: Not on file  Occupational History  . Not on file  Tobacco Use  . Smoking status: Never Smoker  . Smokeless tobacco: Never Used  Vaping Use  . Vaping Use: Never used  Substance and Sexual Activity  . Alcohol use: Never  . Drug use: Never  . Sexual activity: Not on file  Other Topics Concern  . Not on file  Social History Narrative  . Not on file   Social Determinants of Health   Financial Resource Strain: Not on file  Food Insecurity: Not on file  Transportation Needs: Not on file  Physical Activity: Not on file  Stress: Not on file  Social Connections: Not on file      Review of Systems: denies fever,HA,CP,dyspnea, cough, abd /back pain,N/V or bleeding  Vital Signs: BP (!) 135/92   Pulse 61   Temp 98.2 F (36.8 C) (Oral)   Resp 16   SpO2 100%   Physical Exam awake/alert; chest-  CTA bilat; heart- RRR; abd- soft,+BS,NT; no LE edema  Imaging: No results found.  Labs:  CBC: No results for input(s): WBC, HGB, HCT, PLT in the last 8760 hours.  COAGS: No results for input(s): INR, APTT in the last 8760 hours.  BMP: No results for input(s): NA, K, CL, CO2, GLUCOSE, BUN, CALCIUM, CREATININE, GFRNONAA, GFRAA in the last 8760 hours.  Invalid input(s): CMP  LIVER FUNCTION TESTS: No results for input(s): BILITOT, AST, ALT, ALKPHOS, PROT, ALBUMIN in the last 8760 hours.  TUMOR MARKERS: No results for input(s): AFPTM, CEA, CA199, CHROMGRNA in the last 8760 hours.  Assessment and Plan: 40 y.o. female with PMH hypertension, ulcerative colitis and primary sclerosing cholangitis/elevated alk phos level who presents  today for image guided random liver biopsy for further evaluation. She has had prior liver bx in [place] in 2005.Risks and benefits of procedure was discussed with the patient  including, but not limited to bleeding, infection, damage to adjacent structures or low yield requiring additional tests.  All of the questions were answered and there is agreement to proceed.  Consent signed and in chart.     Thank you for this interesting consult.  I greatly enjoyed meeting Nathasha Fiorillo and look forward to participating in their care.  A copy of this report was sent to the requesting provider on this date.  Electronically Signed: D. Rowe Robert, PA-C 04/02/2020, 11:33 AM   I spent a total of 25 minutes    in face to face in clinical consultation, greater than 50% of which was counseling/coordinating care for image guided random liver biopsy

## 2020-04-02 NOTE — Procedures (Signed)
Interventional Radiology Procedure Note  Procedure: US guided core biopsy of liver, random  Complications: None  Estimated Blood Loss: None  Recommendations: - Bedrest x 2 hrs - DC home   Signed,  Criselda Peaches, MD

## 2020-04-02 NOTE — Discharge Instructions (Signed)
Urgent needs - Interventional Radiology on call MD [phone]  Wound - May remove dressing and shower tomorrow.  Keep site clean and dry.  Replace with bandaid as needed.  Do not submerge in tub or water until site healing well. If closed with glue, glue will flake off on its own.  I Liver Biopsy, Care After These instructions give you information on caring for yourself after your procedure. Your doctor may also give you more specific instructions. Call your doctor if you have any problems or questions after your procedure. What can I expect after the procedure? After the procedure, it is common to have:  Pain and soreness where the biopsy was done.  Bruising around the area where the biopsy was done.  Sleepiness and be tired for a few days. Follow these instructions at home: Medicines  Take over-the-counter and prescription medicines only, such as Tylenol.   Do not take medicines such as aspirin and ibuprofen. These medicines can thin your blood. Do not take these medicines unless your doctor tells you to take them.  If you are taking prescription pain medicine, take actions to prevent or treat constipation. Your doctor may recommend that you: ? Drink enough fluid to keep your pee (urine) clear or pale yellow. ? Take over-the-counter or prescription medicines. ? Eat foods that are high in fiber, such as fresh fruits and vegetables, whole grains, and beans. ? Limit foods that are high in fat and processed sugars, such as fried and sweet foods. Caring for your puncture site: You may remove the bandaid after 24 hours.  It is only a puncture site, there is no incision.  You do not have to place another bandaid over the site unless you want to.    Check your puncture site every day for signs of infection. Check for: ? Redness, swelling, or more pain. ? Fluid or blood. ? Pus or a bad smell. ? Warmth.  Do not take baths, swim, or use a hot tub until the puncture site is completely  healed and closed up.   Activity  Rest at home for 1-2 days or as told by your doctor. ? Avoid sitting for a long time without moving. Get up to take short walks every 1-2 hours.  Return to your normal activities as told by your doctor. Ask what activities are safe for you.  Do not do these things in the first 24 hours: ? Drive. ? Use machinery. ? Take a bath or shower.  Do not lift more than 10 pounds (4.5 kg) or play contact sports for the first 2 weeks.   General instructions  Do not drink alcohol in the first week after the procedure.  Have someone stay with you for at least 24 hours after the procedure. ? Get your test results: your test results will be sent to the ordering doctor when they are ready, usually 2-3 business days.    Keep all follow-up visits as told by your doctor. This is important.   Contact a doctor if:  If the puncture site bleeds and leaves more than just a small spot of blood.  The puncture site is red, puffs up (swells), or hurts more than before.  Fluid or something else comes from the puncture site.    The puncture site smells bad.  You have a fever or chills. Get help right away if:  You have swelling, bloating, or pain in your belly (abdomen).  You get dizzy or faint.  You have a  rash.  You feel sick to your stomach (nauseous) or throw up (vomit).  You have trouble breathing, feel short of breath, or feel faint.  Your chest hurts.  You have problems talking or seeing.  You have trouble with your balance or moving your arms or legs. Summary  After the procedure, it is common to have pain, soreness, bruising, and tiredness.  Call your doctor if you have symptoms of infection. Get help right away if your belly swells, your puncture site bleeds a lot, or you have trouble talking or breathing.  Moderate Conscious Sedation, Adult, Care After This sheet gives you information about how to care for yourself after your procedure. Your  health care provider may also give you more specific instructions. If you have problems or questions, contact your health care provider. What can I expect after the procedure? After the procedure, it is common to have:  Sleepiness for several hours.  Impaired judgment for several hours.  Difficulty with balance.  Vomiting if you eat too soon. Follow these instructions at home: For the time period you were told by your health care provider:  Rest.  Do not participate in activities where you could fall or become injured.  Do not drive or use machinery.  Do not drink alcohol.  Do not take sleeping pills or medicines that cause drowsiness.  Do not make important decisions or sign legal documents.  Do not take care of children on your own.      Eating and drinking  Follow the diet recommended by your health care provider.  Drink enough fluid to keep your urine pale yellow.  If you vomit: ? Drink water, juice, or soup when you can drink without vomiting. ? Make sure you have little or no nausea before eating solid foods.   General instructions  Take over-the-counter and prescription medicines only as told by your health care provider.  Have a responsible adult stay with you for the time you are told. It is important to have someone help care for you until you are awake and alert.  Do not smoke.  Keep all follow-up visits as told by your health care provider. This is important. Contact a health care provider if:  You are still sleepy or having trouble with balance after 24 hours.  You feel light-headed.  You keep feeling nauseous or you keep vomiting.  You develop a rash.  You have a fever.  You have redness or swelling around the IV site. Get help right away if:  You have trouble breathing.  You have new-onset confusion at home. Summary  After the procedure, it is common to feel sleepy, have impaired judgment, or feel nauseous if you eat too soon.  Rest  after you get home. Know the things you should not do after the procedure.  Follow the diet recommended by your health care provider and drink enough fluid to keep your urine pale yellow.  Get help right away if you have trouble breathing or new-onset confusion at home. This information is not intended to replace advice given to you by your health care provider. Make sure you discuss any questions you have with your health care provider. Document Revised: 06/03/2019 Document Reviewed: 12/30/2018 Elsevier Patient Education  2021 [employer].

## 2020-04-03 ENCOUNTER — Telehealth: Payer: Self-pay

## 2020-04-03 LAB — SURGICAL PATHOLOGY

## 2020-04-03 NOTE — Telephone Encounter (Signed)
Called patient to schedule an appointment for consultation with MD for preconception counselling for poor OB hx, chronic HTN UC,and Primary sclerosing cholangitis.  Patient stated she needs to wait to schedule the appointment until her PCP sent in the referral for her insurance to cover the visit.

## 2020-04-12 MED FILL — URSODIOL 300 MG CAPS: 300 | 30 days supply | Qty: 120 | Fill #7

## 2020-04-12 MED FILL — XELJANZ 10 MG TABS: 10 | 30 days supply | Qty: 60 | Fill #3

## 2020-04-25 ENCOUNTER — Other Ambulatory Visit (HOSPITAL_COMMUNITY): Payer: Self-pay | Admitting: Obstetrics and Gynecology

## 2020-04-26 MED FILL — TERCONAZOLE 0.4% CREAM: 0.4 | 7 days supply | Qty: 45 | Fill #0

## 2020-05-02 ENCOUNTER — Telehealth: Payer: Self-pay | Admitting: *Deleted

## 2020-05-02 NOTE — Telephone Encounter (Signed)
Patient needs ADV HTN CLINIC appointment  Left message to call back and ask for [place] or [place] to get schedule

## 2020-05-06 ENCOUNTER — Other Ambulatory Visit (HOSPITAL_COMMUNITY): Payer: Self-pay | Admitting: Obstetrics and Gynecology

## 2020-05-07 MED FILL — NORETHINDRONE 0.35 MG TABS: 0.35 | 84 days supply | Qty: 84 | Fill #0

## 2020-05-08 MED FILL — XELJANZ 10 MG TABS: 10 | 30 days supply | Qty: 60 | Fill #4

## 2020-05-15 ENCOUNTER — Other Ambulatory Visit (HOSPITAL_COMMUNITY): Payer: Self-pay

## 2020-05-21 MED FILL — Ursodiol Cap 300 MG: ORAL | 30 days supply | Qty: 120 | Fill #0 | Status: AC

## 2020-05-22 ENCOUNTER — Other Ambulatory Visit (HOSPITAL_COMMUNITY): Payer: Self-pay

## 2020-05-24 ENCOUNTER — Other Ambulatory Visit (HOSPITAL_COMMUNITY): Payer: Self-pay

## 2020-05-28 ENCOUNTER — Other Ambulatory Visit (HOSPITAL_COMMUNITY): Payer: Self-pay

## 2020-05-28 MED FILL — Hydrochlorothiazide Tab 25 MG: ORAL | 90 days supply | Qty: 90 | Fill #0 | Status: AC

## 2020-05-28 MED FILL — Metoprolol Succinate Tab ER 24HR 100 MG (Tartrate Equiv): ORAL | 90 days supply | Qty: 90 | Fill #0 | Status: AC

## 2020-05-30 ENCOUNTER — Other Ambulatory Visit (HOSPITAL_COMMUNITY): Payer: Self-pay

## 2020-06-01 ENCOUNTER — Other Ambulatory Visit (HOSPITAL_COMMUNITY): Payer: Self-pay

## 2020-06-04 ENCOUNTER — Other Ambulatory Visit: Payer: Self-pay | Admitting: Internal Medicine

## 2020-06-04 ENCOUNTER — Other Ambulatory Visit (HOSPITAL_COMMUNITY): Payer: Self-pay

## 2020-06-04 MED ORDER — FOLIC ACID 1 MG PO TABS
1.0000 mg | ORAL_TABLET | Freq: Every day | ORAL | 3 refills | Status: DC
  Filled 2020-06-04: qty 90, 90d supply, fill #0
  Filled 2020-09-09: qty 90, 90d supply, fill #1
  Filled 2020-12-24: qty 90, 90d supply, fill #2
  Filled 2021-03-29: qty 90, 90d supply, fill #3

## 2020-06-05 ENCOUNTER — Other Ambulatory Visit (HOSPITAL_COMMUNITY): Payer: Self-pay

## 2020-06-06 ENCOUNTER — Other Ambulatory Visit (HOSPITAL_COMMUNITY): Payer: Self-pay

## 2020-06-07 ENCOUNTER — Other Ambulatory Visit (HOSPITAL_COMMUNITY): Payer: Self-pay

## 2020-06-07 ENCOUNTER — Other Ambulatory Visit: Payer: Self-pay | Admitting: Pharmacist

## 2020-06-07 MED ORDER — XELJANZ 10 MG PO TABS
ORAL_TABLET | ORAL | 11 refills | Status: DC
  Filled 2020-06-07: qty 60, 30d supply, fill #0
  Filled 2020-07-02: qty 60, 30d supply, fill #1
  Filled 2020-07-30: qty 60, 30d supply, fill #2
  Filled 2020-08-29: qty 60, 30d supply, fill #3
  Filled 2020-09-26: qty 60, 30d supply, fill #4
  Filled 2020-10-24: qty 60, 30d supply, fill #5
  Filled 2020-11-30: qty 60, 30d supply, fill #6
  Filled 2021-01-03: qty 60, 30d supply, fill #7
  Filled 2021-02-05: qty 60, 30d supply, fill #8
  Filled 2021-02-26: qty 60, 30d supply, fill #9
  Filled 2021-03-27: qty 60, 30d supply, fill #10
  Filled 2021-04-26: qty 60, 30d supply, fill #11

## 2020-06-07 MED ORDER — XELJANZ 10 MG PO TABS: ORAL_TABLET | ORAL | 11 refills | Status: DC

## 2020-06-08 ENCOUNTER — Other Ambulatory Visit (HOSPITAL_COMMUNITY): Payer: Self-pay

## 2020-06-14 NOTE — Telephone Encounter (Signed)
General 04/05/2020 12:23 PM Duarte, Jamie Pitter - -  Note   Closing referral. Patient states due to her insurance her PCP has to refer her. She states she will contact them and requested the referral be closed. - sz

## 2020-06-18 MED FILL — Ursodiol Cap 300 MG: ORAL | 30 days supply | Qty: 120 | Fill #1 | Status: AC

## 2020-06-19 ENCOUNTER — Other Ambulatory Visit (HOSPITAL_COMMUNITY): Payer: Self-pay

## 2020-06-20 ENCOUNTER — Other Ambulatory Visit (HOSPITAL_COMMUNITY): Payer: Self-pay

## 2020-06-22 ENCOUNTER — Other Ambulatory Visit (HOSPITAL_COMMUNITY): Payer: Self-pay

## 2020-07-02 ENCOUNTER — Other Ambulatory Visit (HOSPITAL_COMMUNITY): Payer: Self-pay

## 2020-07-04 ENCOUNTER — Other Ambulatory Visit (HOSPITAL_COMMUNITY): Payer: Self-pay

## 2020-07-24 ENCOUNTER — Other Ambulatory Visit (HOSPITAL_COMMUNITY): Payer: Self-pay

## 2020-07-24 MED FILL — Ursodiol Cap 300 MG: ORAL | 30 days supply | Qty: 120 | Fill #2 | Status: AC

## 2020-07-25 ENCOUNTER — Other Ambulatory Visit (HOSPITAL_COMMUNITY): Payer: Self-pay

## 2020-07-26 ENCOUNTER — Other Ambulatory Visit (HOSPITAL_COMMUNITY): Payer: Self-pay

## 2020-07-30 ENCOUNTER — Other Ambulatory Visit (HOSPITAL_COMMUNITY): Payer: Self-pay

## 2020-07-31 ENCOUNTER — Other Ambulatory Visit: Payer: Self-pay

## 2020-07-31 ENCOUNTER — Ambulatory Visit
Payer: No Typology Code available for payment source | Attending: Obstetrics and Gynecology | Admitting: Obstetrics and Gynecology

## 2020-07-31 ENCOUNTER — Encounter: Payer: Self-pay | Admitting: *Deleted

## 2020-07-31 ENCOUNTER — Ambulatory Visit: Payer: No Typology Code available for payment source | Admitting: *Deleted

## 2020-07-31 ENCOUNTER — Other Ambulatory Visit (HOSPITAL_COMMUNITY): Payer: Self-pay

## 2020-07-31 DIAGNOSIS — O34219 Maternal care for unspecified type scar from previous cesarean delivery: Secondary | ICD-10-CM

## 2020-07-31 DIAGNOSIS — Z3169 Encounter for other general counseling and advice on procreation: Secondary | ICD-10-CM

## 2020-07-31 DIAGNOSIS — K5190 Ulcerative colitis, unspecified, without complications: Secondary | ICD-10-CM | POA: Diagnosis not present

## 2020-07-31 DIAGNOSIS — K5180 Other ulcerative colitis without complications: Secondary | ICD-10-CM

## 2020-07-31 DIAGNOSIS — I10 Essential (primary) hypertension: Secondary | ICD-10-CM | POA: Diagnosis not present

## 2020-07-31 DIAGNOSIS — K8301 Primary sclerosing cholangitis: Secondary | ICD-10-CM | POA: Insufficient documentation

## 2020-07-31 DIAGNOSIS — Z8759 Personal history of other complications of pregnancy, childbirth and the puerperium: Secondary | ICD-10-CM | POA: Insufficient documentation

## 2020-07-31 DIAGNOSIS — Z79899 Other long term (current) drug therapy: Secondary | ICD-10-CM | POA: Insufficient documentation

## 2020-07-31 DIAGNOSIS — Z98891 History of uterine scar from previous surgery: Secondary | ICD-10-CM | POA: Diagnosis not present

## 2020-07-31 DIAGNOSIS — Z3161 Procreative counseling and advice using natural family planning: Secondary | ICD-10-CM | POA: Diagnosis not present

## 2020-07-31 NOTE — Progress Notes (Signed)
Maternal-Fetal Medicine  Name: Jamie Duarte DOB: 1980-10-01 MRN: [id number] Referring Provide: Servando Salina, MD   I had the pleasure of seeing [name] today at the [facility] for maternal-fetal care.  She is G2 P0200 and he is here for preconception consultation.  Her problems include -Ulcerative colitis. Chronic hypertension. Primary sclerosing cholangitis. Advanced maternal age. Increased anti-SSA antibodies (Ro). -Previous classical cesarean section. History of LEEP and cerclage.  Chronic hypertension was diagnosed in 2009 and the patient takes metoprolol succinate 100 mg and HCTZ 25 mg daily.  She reports her work and home blood pressures range 120-130s/60-70 mmHg.  Blood pressure today at her office is 148/88 mmHg.  Ulcerative colitis (on colonoscopy) and primary sclerosing cholangitis ([place]) were diagnosed in 2004 in [place].  Patient had weight loss of 30 pounds, and on routine labs, increased liver enzymes were seen.  Biopsy confirmed PSC.  Patient was on Asacol for treatment of ulcerative colitis.  Currently, she takes tofacitinib Morrie Sheldon) and has not had flares for about 3 years.  She is being followed by her gastroenterologist, Dr. Juanita Craver.  She had a liver biopsy by IR in February 2022 that showed no pathological fibrosis.  MRI abdomen with contrast performed in October 2021 showed mild intrahepatic biliary ductal dilation.  The findings were essentially unchanged from November 2020 MRI.  No other abnormalities were detected.  Ultrasound performed in April 2020 showed no biliary duct dilations or gallstones.  Patient takes Actigall 600 mg twice daily.  Patient reports that her recent liver enzymes performed in February 2022 where increased (ALT around 300).  We do not have the reports with [place] now.  Review of systems: No nausea or vomiting or diarrhea or constipation.  No severe headache or visual disturbances.  No severe abdominal pain.  Patient experiences  occasional knee joint pains.  No history of recurrent urinary infections or intermenstrual vaginal bleeding.  No dyspareunia.  She has a history of hypothyroidism and is reportedly euthyroid now.  PSH: Cesarean section (2009), breast lumpectomy (1999). Medications: Tofacitinib Morrie Sheldon), actigall, folic acid, multivitamins, metoprolol succinate, HCTZ, micronor.  Allergies: No known drug allergies. Social history: Denies tobacco or drug or alcohol use.  She has been married 2 years.  Her partner is African-American and he is in good health.  Family history: Both parents have hypertension.  Mother is a breast cancer survivor and is in good health.  She has a younger brother who is in good health.  Obstetric history -2002: Twin pregnancy and spontaneous pregnancy loss at [redacted] weeks gestation. -2012: Cesarean delivery at [redacted] weeks gestation of a female infant.  Cesarean section was performed because of preeclampsia with severe features.  Unfortunately her daughter died at 60 months of age. She had prophylactic cerclage in that pregnancy. GYN history: History of LEEP in 2002 following abnormal Pap smears.  Breast biopsy 1999 (benign).  Patient reports having regular periods.  She takes progesterone only pills for contraception.  Our concerns include: Chronic hypertension Adverse effects in pregnancy correlate well with the severity of hypertension and the duration of hypertension. Maternal complications (in poorly-controlled hypertension) include stroke, pulmonary edema, and renal failure. Placental abruption fetal growth restriction, preterm delivery (indicated delivery in most cases), perinatal mortality, caesarean delivery, and postpartum hemorrhage are all increased. Superimposed preeclamspsia occurs in up to 30% of patients with chronic hypertension.   Most antihypertensives prescribed in pregnancy have established safety-profiles. Labetolol and methyldopa are the most-commonly used drugs. No  increased congenital malformations have been noted with these  drugs. Calcium channel blocker (nifedipine) can also be used as part of multi-drug regimen.   I reassured the patient that metoprolol can be safely taken in pregnancy. Betablockers are not associated with increased congenital malformations.   Hydrocholorothiazide (HCTZ) is not associated with fetal congenital malformations if taken in the first trimester. It is not the first-line antihypertensive in pregnancy. Side effects include maternal hypovolemia.  I discussed ultrasound protocol in pregnancy.  I discussed the benefit of low-dose aspirin prophylaxis in pregnancy that prevents her daily use the onset of preeclampsia.  Patient does not have contraindications to aspirin.  Ulcerative colitis Effect of pregnancy on IBD: No increased risk of flares is seen in pregnancy. Higher rate of remissions (reduced rates of relapse) has been reported after pregnancy especially in patients with ulcerative colitis.  Effect of IBD on pregnancy: Active disease in pregnancy or just before conception is associated with a higher likelihood of preterm births, fetal growth restriction and low-birth weights.  Patient has not had flares for about 3 years and is likely to have good pregnancy outcomes.  Tofacitinib Morrie Sheldon): Patient reports good response with this medication. Animal studies showed low fetal risk. However, we do not have enough information in the human population. In one report of 22 maternal exposures (23 in the first trimester), majority had healthy infants. Number of pregnant women were too small to give a definitive conclusion.   I counseled the patient that it is safer to avoid this drug in pregnancy unless alternative drug (with equal efficacy) is not available. Patient informed that cannot take humira injections (fear of needle). She will discuss with her GI and decide.  Primary Sclerosing Cholangitis Recent liver biopsy reports are  encouraging and that she does not have liver cirrhosis.  Patient reports that recent increase in liver enzymes could be from tofacitinib and will be discussing with her GI.  I counseled her that it is safer to attempt pregnancy when her liver enzymes returned to normal. In a retrospective review of 60 women with PSC, the median gestation of delivery was [redacted] weeks gestation.  Preterm delivery rate was 28% that had inverse relationship with ALT and bile acid levels.  Overall, preterm delivery rates are expected to increase if ALT levels are high  (Cauldwell et al. [employer] DOI: 10.1111/1471-0528.16119)  I recommended a consultation with liver specialist before attempting pregnancy.  Advanced maternal age I counseled the patient on the association of advanced maternal age and increased risk of fetal aneuploidies.  I discussed cell free fetal DNA screening, its significance, and limitations.  I discussed invasive testing including chorionic villous sampling or amniocentesis.  Increased anti-SSA (Ro) antibodies Patient has increased anti-SSA antibodies (2019).  I counseled the patient that Ro antibodies can be associated with congenital heart block in 1% to 2% of infants.  However, most infants are not affected.  Neonatal skin lupus is also more common (about 25%).  Prophylactic treatment with steroids is not indicated.  We will refer for fetal echocardiography in pregnancy.  Previous cesarean section Patient is very keen on having repeat cesarean section.  After the patient left our office, I reviewed the previous operative note and discovered that she had classical cesarean section.  In patient with previous classical cesarean section, we recommend delivery at 37 weeks' gestation. Previous cesarean section increases the risk of placenta previa and/or placenta accreta spectrum.  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  I explained that history of LEEP and midtrimester pregnancy losses increase the risk of recurrent midtrimester miscarriage.  Prophylactic cerclage at 12 to 13 weeks is reasonable.  Recommendations: -First-trimester anatomy scan at 12 to 13 weeks' gestation. -Prophylactic cerclage at 12 to 14 weeks after cell-free fetal DNA screening. -Cell-free fetal DNA screening at 10 weeks or chorionic villus sampling ([facility]) at 12-13 weeks/ amniocentesis at 16+ weeks. -Detailed fetal anatomical survey at 18 to 20 weeks. -Fetal echocardiography at 20 weeks. -Fetal growth assessments every 4 weeks. -Weekly antenatal testing from 32 weeks' gestation till delivery. -Delivery at 37 weeks' gestation (previous classical cesarean section). -Low-dose aspirin from 12 weeks' gestation till delivery. -Repeat anti-SSA (Ro) antibodies with prenatal labs in pregnancy. -Consultation with Liver specialist. -Follow-up with her GI. -Continue preconception folic acid to reduce the likelihood of open-neural tube defects. -Attempt pregnancy when ALT levels are normal.   Thank you for consultation. If you have any questions, please contact me at the [facility]. Consultation including face-to-face counseling 60 minutes.

## 2020-07-31 NOTE — Progress Notes (Signed)
Here for preconception consult.  BP 148/88  Pulse 60

## 2020-08-02 ENCOUNTER — Other Ambulatory Visit (HOSPITAL_COMMUNITY): Payer: Self-pay

## 2020-08-21 ENCOUNTER — Other Ambulatory Visit (HOSPITAL_COMMUNITY): Payer: Self-pay

## 2020-08-21 MED FILL — Ursodiol Cap 300 MG: ORAL | 30 days supply | Qty: 120 | Fill #3 | Status: AC

## 2020-08-21 MED FILL — Norethindrone Tab 0.35 MG: ORAL | 84 days supply | Qty: 84 | Fill #0 | Status: AC

## 2020-08-22 ENCOUNTER — Other Ambulatory Visit (HOSPITAL_COMMUNITY): Payer: Self-pay

## 2020-08-23 ENCOUNTER — Other Ambulatory Visit (HOSPITAL_COMMUNITY): Payer: Self-pay

## 2020-08-23 MED ORDER — HYDROCHLOROTHIAZIDE 25 MG PO TABS
25.0000 mg | ORAL_TABLET | Freq: Every day | ORAL | 0 refills | Status: DC
  Filled 2020-08-23: qty 90, 90d supply, fill #0

## 2020-08-27 ENCOUNTER — Other Ambulatory Visit (HOSPITAL_COMMUNITY): Payer: Self-pay

## 2020-08-28 ENCOUNTER — Other Ambulatory Visit (HOSPITAL_COMMUNITY): Payer: Self-pay

## 2020-08-29 ENCOUNTER — Other Ambulatory Visit (HOSPITAL_COMMUNITY): Payer: Self-pay

## 2020-08-30 ENCOUNTER — Other Ambulatory Visit (HOSPITAL_COMMUNITY): Payer: Self-pay

## 2020-09-09 ENCOUNTER — Other Ambulatory Visit (HOSPITAL_COMMUNITY): Payer: Self-pay

## 2020-09-10 ENCOUNTER — Other Ambulatory Visit (HOSPITAL_COMMUNITY): Payer: Self-pay

## 2020-09-10 MED ORDER — METOPROLOL SUCCINATE ER 100 MG PO TB24
ORAL_TABLET | ORAL | 0 refills | Status: DC
  Filled 2020-09-10: qty 90, 90d supply, fill #0

## 2020-09-13 ENCOUNTER — Other Ambulatory Visit (HOSPITAL_COMMUNITY): Payer: Self-pay

## 2020-09-24 ENCOUNTER — Other Ambulatory Visit (HOSPITAL_COMMUNITY): Payer: Self-pay

## 2020-09-24 MED ORDER — URSODIOL 300 MG PO CAPS
ORAL_CAPSULE | ORAL | 3 refills | Status: DC
  Filled 2020-09-24: qty 360, 90d supply, fill #0
  Filled 2020-12-30: qty 360, 90d supply, fill #1
  Filled 2021-03-29: qty 360, 90d supply, fill #2

## 2020-09-25 ENCOUNTER — Other Ambulatory Visit (HOSPITAL_COMMUNITY): Payer: Self-pay

## 2020-09-26 ENCOUNTER — Other Ambulatory Visit (HOSPITAL_COMMUNITY): Payer: Self-pay

## 2020-09-27 ENCOUNTER — Other Ambulatory Visit (HOSPITAL_COMMUNITY): Payer: Self-pay

## 2020-09-28 ENCOUNTER — Other Ambulatory Visit (HOSPITAL_COMMUNITY): Payer: Self-pay

## 2020-09-28 MED ORDER — HYDROCHLOROTHIAZIDE 25 MG PO TABS
ORAL_TABLET | ORAL | 4 refills | Status: DC
  Filled 2020-09-28: qty 90, 90d supply, fill #0

## 2020-09-28 MED ORDER — METOPROLOL SUCCINATE ER 100 MG PO TB24
ORAL_TABLET | ORAL | 4 refills | Status: DC
  Filled 2020-12-06: qty 90, 90d supply, fill #0
  Filled 2021-03-29: qty 90, 90d supply, fill #1
  Filled 2021-06-25: qty 90, 90d supply, fill #2
  Filled 2021-09-20: qty 90, 90d supply, fill #3

## 2020-10-19 ENCOUNTER — Other Ambulatory Visit (HOSPITAL_BASED_OUTPATIENT_CLINIC_OR_DEPARTMENT_OTHER): Payer: Self-pay

## 2020-10-23 ENCOUNTER — Ambulatory Visit (HOSPITAL_BASED_OUTPATIENT_CLINIC_OR_DEPARTMENT_OTHER): Payer: No Typology Code available for payment source | Admitting: Cardiovascular Disease

## 2020-10-24 ENCOUNTER — Other Ambulatory Visit (HOSPITAL_COMMUNITY): Payer: Self-pay

## 2020-10-25 ENCOUNTER — Other Ambulatory Visit (HOSPITAL_COMMUNITY): Payer: Self-pay

## 2020-11-02 ENCOUNTER — Other Ambulatory Visit (HOSPITAL_COMMUNITY): Payer: Self-pay | Admitting: Gastroenterology

## 2020-11-02 DIAGNOSIS — R748 Abnormal levels of other serum enzymes: Secondary | ICD-10-CM

## 2020-11-04 MED FILL — Norethindrone Tab 0.35 MG: ORAL | 84 days supply | Qty: 84 | Fill #1 | Status: AC

## 2020-11-05 ENCOUNTER — Other Ambulatory Visit (HOSPITAL_COMMUNITY): Payer: Self-pay

## 2020-11-08 ENCOUNTER — Other Ambulatory Visit (HOSPITAL_COMMUNITY): Payer: Self-pay

## 2020-11-08 ENCOUNTER — Encounter: Payer: Self-pay | Admitting: Hematology and Oncology

## 2020-11-08 MED ORDER — LIDOCAINE (ANORECTAL) 5 % EX CREA
TOPICAL_CREAM | CUTANEOUS | 0 refills | Status: DC
  Filled 2020-11-08: qty 15, 10d supply, fill #0

## 2020-11-08 MED ORDER — VALACYCLOVIR HCL 1 G PO TABS
ORAL_TABLET | ORAL | 0 refills | Status: DC
  Filled 2020-11-08: qty 20, 10d supply, fill #0

## 2020-11-09 ENCOUNTER — Other Ambulatory Visit (HOSPITAL_COMMUNITY): Payer: Self-pay

## 2020-11-12 ENCOUNTER — Other Ambulatory Visit (HOSPITAL_COMMUNITY): Payer: Self-pay

## 2020-11-13 ENCOUNTER — Encounter: Payer: Self-pay | Admitting: Cardiovascular Disease

## 2020-11-15 ENCOUNTER — Other Ambulatory Visit (HOSPITAL_COMMUNITY): Payer: Self-pay | Admitting: Gastroenterology

## 2020-11-15 ENCOUNTER — Ambulatory Visit (HOSPITAL_COMMUNITY)
Admission: RE | Admit: 2020-11-15 | Discharge: 2020-11-15 | Disposition: A | Discharge status: Home / Self Care | Payer: No Typology Code available for payment source | Source: Ambulatory Visit | Attending: Gastroenterology | Admitting: Gastroenterology

## 2020-11-15 DIAGNOSIS — R748 Abnormal levels of other serum enzymes: Secondary | ICD-10-CM | POA: Diagnosis not present

## 2020-11-15 MED ORDER — GADOBUTROL 1 MMOL/ML IV SOLN
9.0000 mL | Freq: Once | INTRAVENOUS | Status: AC | PRN
  Administered 2020-11-15: 9 mL via INTRAVENOUS

## 2020-11-30 ENCOUNTER — Other Ambulatory Visit (HOSPITAL_COMMUNITY): Payer: Self-pay

## 2020-12-04 ENCOUNTER — Other Ambulatory Visit (HOSPITAL_COMMUNITY): Payer: Self-pay

## 2020-12-06 ENCOUNTER — Other Ambulatory Visit (HOSPITAL_COMMUNITY): Payer: Self-pay

## 2020-12-10 ENCOUNTER — Other Ambulatory Visit (HOSPITAL_COMMUNITY): Payer: Self-pay

## 2020-12-24 ENCOUNTER — Other Ambulatory Visit (HOSPITAL_COMMUNITY): Payer: Self-pay

## 2020-12-31 ENCOUNTER — Other Ambulatory Visit (HOSPITAL_COMMUNITY): Payer: Self-pay

## 2021-01-01 ENCOUNTER — Other Ambulatory Visit (HOSPITAL_COMMUNITY): Payer: Self-pay

## 2021-01-03 ENCOUNTER — Other Ambulatory Visit (HOSPITAL_COMMUNITY): Payer: Self-pay

## 2021-01-08 ENCOUNTER — Other Ambulatory Visit (HOSPITAL_COMMUNITY): Payer: Self-pay

## 2021-01-16 ENCOUNTER — Other Ambulatory Visit (HOSPITAL_COMMUNITY): Payer: Self-pay

## 2021-01-16 MED ORDER — HYDROCHLOROTHIAZIDE 12.5 MG PO CAPS
ORAL_CAPSULE | ORAL | 3 refills | Status: DC
  Filled 2021-01-16: qty 90, 90d supply, fill #0

## 2021-01-31 ENCOUNTER — Other Ambulatory Visit (HOSPITAL_COMMUNITY): Payer: Self-pay

## 2021-02-06 ENCOUNTER — Other Ambulatory Visit (HOSPITAL_COMMUNITY): Payer: Self-pay

## 2021-02-26 ENCOUNTER — Other Ambulatory Visit (HOSPITAL_COMMUNITY): Payer: Self-pay

## 2021-02-27 ENCOUNTER — Other Ambulatory Visit (HOSPITAL_COMMUNITY): Payer: Self-pay

## 2021-02-27 MED ORDER — AMLODIPINE BESYLATE 5 MG PO TABS
ORAL_TABLET | ORAL | 0 refills | Status: DC
  Filled 2021-02-27: qty 45, 45d supply, fill #0

## 2021-02-28 ENCOUNTER — Other Ambulatory Visit (HOSPITAL_COMMUNITY): Payer: Self-pay

## 2021-02-28 MED ORDER — POTASSIUM CHLORIDE 20 MEQ PO PACK
PACK | ORAL | 0 refills | Status: DC
  Filled 2021-02-28 – 2021-03-02 (×2): qty 90, 90d supply, fill #0

## 2021-03-02 ENCOUNTER — Other Ambulatory Visit (HOSPITAL_COMMUNITY): Payer: Self-pay

## 2021-03-04 ENCOUNTER — Other Ambulatory Visit (HOSPITAL_COMMUNITY): Payer: Self-pay

## 2021-03-05 ENCOUNTER — Other Ambulatory Visit (HOSPITAL_COMMUNITY): Payer: Self-pay

## 2021-03-27 ENCOUNTER — Other Ambulatory Visit (HOSPITAL_COMMUNITY): Payer: Self-pay

## 2021-03-28 ENCOUNTER — Other Ambulatory Visit (HOSPITAL_COMMUNITY): Payer: Self-pay

## 2021-03-29 ENCOUNTER — Other Ambulatory Visit (HOSPITAL_COMMUNITY): Payer: Self-pay

## 2021-04-01 ENCOUNTER — Other Ambulatory Visit (HOSPITAL_COMMUNITY): Payer: Self-pay

## 2021-04-02 ENCOUNTER — Other Ambulatory Visit (HOSPITAL_COMMUNITY): Payer: Self-pay

## 2021-04-19 ENCOUNTER — Other Ambulatory Visit (HOSPITAL_COMMUNITY): Payer: Self-pay

## 2021-04-19 MED ORDER — HYDROCHLOROTHIAZIDE 25 MG PO TABS
25.0000 mg | ORAL_TABLET | Freq: Every day | ORAL | 1 refills | Status: DC
  Filled 2021-04-19: qty 90, 90d supply, fill #0

## 2021-04-26 ENCOUNTER — Other Ambulatory Visit (HOSPITAL_COMMUNITY): Payer: Self-pay

## 2021-04-30 ENCOUNTER — Other Ambulatory Visit (HOSPITAL_COMMUNITY): Payer: Self-pay

## 2021-04-30 MED ORDER — AMLODIPINE BESYLATE 5 MG PO TABS
5.0000 mg | ORAL_TABLET | Freq: Every day | ORAL | 0 refills | Status: DC
  Filled 2021-04-30: qty 45, 45d supply, fill #0

## 2021-05-02 ENCOUNTER — Other Ambulatory Visit (HOSPITAL_COMMUNITY): Payer: Self-pay

## 2021-05-09 ENCOUNTER — Other Ambulatory Visit: Payer: Self-pay

## 2021-05-09 ENCOUNTER — Encounter: Payer: Self-pay | Admitting: Cardiology

## 2021-05-09 ENCOUNTER — Other Ambulatory Visit (HOSPITAL_COMMUNITY): Payer: Self-pay

## 2021-05-09 ENCOUNTER — Ambulatory Visit (INDEPENDENT_AMBULATORY_CARE_PROVIDER_SITE_OTHER): Payer: No Typology Code available for payment source | Admitting: Cardiology

## 2021-05-09 VITALS — BP 126/88 | HR 69 | Ht 67.0 in | Wt 186.8 lb

## 2021-05-09 DIAGNOSIS — O10919 Unspecified pre-existing hypertension complicating pregnancy, unspecified trimester: Secondary | ICD-10-CM | POA: Insufficient documentation

## 2021-05-09 DIAGNOSIS — E782 Mixed hyperlipidemia: Secondary | ICD-10-CM | POA: Diagnosis not present

## 2021-05-09 MED ORDER — AMLODIPINE BESYLATE 10 MG PO TABS
10.0000 mg | ORAL_TABLET | Freq: Every day | ORAL | 3 refills | Status: DC
  Filled 2021-05-09: qty 90, 90d supply, fill #0
  Filled 2021-08-16: qty 90, 90d supply, fill #1
  Filled 2021-11-18: qty 90, 90d supply, fill #2
  Filled 2022-02-16: qty 90, 90d supply, fill #3

## 2021-05-09 NOTE — Patient Instructions (Signed)
Medication Instructions:  ?STOP: HCTZ  ? ?START: AMLODIPINE 27m ONCE DAILY  ? ?*If you need a refill on your cardiac medications before your next appointment, please call your pharmacy* ? ?Follow-Up: ?[facility] and your health needs are our priority.  As part of our continuing mission to provide you with exceptional heart care, we have created designated Provider Care Teams.  These Care Teams include your primary Cardiologist (physician) and Advanced Practice Providers (APPs -  Physician Assistants and Nurse Practitioners) who all work together to provide you with the care you need, when you need it. ? ?Your next appointment:   ?3 month(s) ? ?The format for your next appointment:   ?In Person ? ?Provider:   ?KBerniece Salines DO   ? ?Other Instructions ?Please check your blood pressure at home daily, write it down. Send message via Mychart with the readings in 2 weeks for Dr. THarriet Massonto review.  ?  ?

## 2021-05-09 NOTE — Progress Notes (Signed)
?[facility] ? ?New Evaluation ? ?Date:  05/09/2021  ? ?ID:  Jamie Duarte, DOB [birth year], MRN [id number] ? ?PCP:  Gaynelle Arabian, MD ?  ?[facility] Providers ?Cardiologist:  Berniece Salines, DO  ?Electrophysiologist:  None      ? ?Referring MD: Servando Salina, MD  ? ?Chief Complaint: " I have had high blood pressure" ? ?History of Present Illness:   ? ?Jamie Duarte is a 41 y.o. female [G3P0200] who is being seen today for the evaluation of chronic hypertension in pregnancy at the request of Servando Salina, MD.  ? ?Medical history includes hypertension which was diagnosed about 6 to 7 years ago currently on amlodipine 5 mg daily, HCTZ 25, metoprolol succinate 100 mg daily.  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  She is concerned that she may have lost this pregnancy at 6 weeks because she has been experiencing some bleeding and clotting. ? ?No other complaints at this time. ? ?Prior CV Studies Reviewed: ?The following studies were reviewed today: None today  ? ? ?Past Medical History:  ?Diagnosis Date  ? Hypertension   ? Ulcerative colitis ([place])   ? Vaginal Pap smear, abnormal   ? ? ?Past Surgical History:  ?Procedure Laterality Date  ? BREAST LUMPECTOMY Left 1998  ? CESAREAN SECTION    ?   ? ?OB History   ? ? Gravida  ?3  ? Para  ?2  ? Term  ?   ? Preterm  ?2  ? AB  ?   ? Living  ?0  ?  ? ? SAB  ?   ? IAB  ?   ? Ectopic  ?   ? Multiple  ?   ? Live Births  ?1  ?   ?  ?  ?    ? ? ?Current Medications: ?Current Meds  ?Medication Sig  ? folic acid (FOLVITE) 1 MG tablet Take 1 tablet by mouth daily.  ? metoprolol succinate (TOPROL-XL) 100 MG 24 hr tablet   ? Multiple Vitamins-Minerals (MULTIVITAMIN ADULT PO) Take 1 tablet by mouth daily.  ?  Prenatal Vit-Fe Fumarate-FA (PRENATAL PO) Take by mouth.  ? Tofacitinib Citrate (XELJANZ) 10 MG TABS TAKE 1 TABLET BY MOUTH TWICE DAILY  ? ursodiol (ACTIGALL) 500 MG tablet Take 1 tablet (500 mg total) by mouth 2 (two) times daily.  ? [DISCONTINUED] amLODipine (NORVASC) 5 MG tablet Take 1 tablet by mouth once a day  ? [DISCONTINUED] hydrochlorothiazide (HYDRODIURIL) 25 MG tablet Take 25 mg by mouth daily. Reported on 06/06/2015  ?  ? ?Allergies:   Patient has no known allergies.  ? ?Social History  ? ?Socioeconomic History  ? Marital status: Single  ?  Spouse name: Not on file  ? Number of children: Not on file  ? Years of education: Not on file  ? Highest education level: Not on file  ?Occupational History  ? Not on file  ?Tobacco Use  ? Smoking status: Never  ? Smokeless tobacco: Never  ?Vaping Use  ? Vaping Use: Never used  ?Substance and Sexual Activity  ? Alcohol use: Never  ? Drug use: Never  ? Sexual activity: Not on file  ?Other Topics Concern  ? Not on file  ?Social History Narrative  ? Not on file  ? ?  Social Determinants of Health  ? ?Financial Resource Strain: Not on file  ?Food Insecurity: No Food Insecurity  ? Worried About Charity fundraiser in the Last Year: Never true  ? Ran Out of Food in the Last Year: Never true  ?Transportation Needs: Unmet Transportation Needs  ? Lack of Transportation (Medical): Yes  ? Lack of Transportation (Non-Medical): Yes  ?Physical Activity: Not on file  ?Stress: Not on file  ?Social Connections: Not on file  ?  ? ? ?Family History  ?Problem Relation Age of Onset  ? Healthy Mother   ? Cancer Mother   ?     breast   ? Healthy Father   ? Healthy Brother   ? Allergic rhinitis Neg Hx   ? Angioedema Neg Hx   ? Asthma Neg Hx   ? Atopy Neg Hx   ? Eczema Neg Hx   ? Urticaria Neg Hx   ? Immunodeficiency Neg Hx   ?   ? ?ROS:   ?Please see the history of present illness.    ? ?All other systems reviewed and are negative. ? ? ?Labs/EKG Reviewed:   ? ?EKG:   ?EKG is was ordered today.   The ekg ordered today demonstrates sinus rhythm, heart 69 bpm. ? ?Recent Labs: ?No results found for requested labs within last 8760 hours.  ? ?Recent Lipid Panel ?No results found for: CHOL, TRIG, HDL, CHOLHDL, LDLCALC, LDLDIRECT ? ?Physical Exam:   ? ?VS:  BP 126/88 (BP Location: Right Arm, Patient Position: Sitting, Cuff Size: Normal)   Pulse 69   Ht 5' 7"  (1.702 m)   Wt 186 lb 12.8 oz (84.7 kg)   SpO2 98%   BMI 29.26 kg/m?    ? ?Wt Readings from Last 3 Encounters:  ?05/09/21 186 lb 12.8 oz (84.7 kg)  ?04/06/18 196 lb (88.9 kg)  ?03/17/18 186 lb (84.4 kg)  ?  ? ?GEN:  Well nourished, well developed in no acute distress ?HEENT: Normal ?NECK: No JVD; No carotid bruits ?LYMPHATICS: No lymphadenopathy ?CARDIAC: RRR, no murmurs, rubs, gallops ?RESPIRATORY:  Clear to auscultation without rales, wheezing or rhonchi  ?ABDOMEN: Soft, non-tender, non-distended ?MUSCULOSKELETAL:  No edema; No deformity  ?SKIN: Warm and dry ?NEUROLOGIC:  Alert and oriented x 3 ?PSYCHIATRIC:  Normal affect  ? ? ?Risk Assessment/Risk Calculators:   ?  ?CARPREG II ?Risk Prediction Index Score:  1.  The patient's risk for a primary cardiac event is 5%. ?  ?   ?  ?  ? ? ?ASSESSMENT & PLAN:   ? ?Chronic hypertension in pregnancy-by her account she should be [redacted] weeks pregnant today but she is concerned that she may be losing the baby.  In terms of her blood pressure she is currently on amlodipine 5 mg daily and can increase that to 10 mg daily.  We will stop the HCTZ for now and then continue to metoprolol succinate.  She will take her blood pressure daily for me in send me that information in 1 to 2 weeks.  Should her blood pressure still be elevated I plan to consider the use of hydralazine since these medications are safe in pregnancy.  ? ?She does have history of hyperlipidemia-for right now given the fact that she may be pregnant and also will attempt to continue to try we will not start any statin medication on this patient. ? ?The  patient understands the need to lose weight with diet and exercise. We have discussed specific strategies for this. ? ?  Patient Instructions  ?Medication Instructions:  ?STOP: HCTZ  ? ?START: AMLODIPINE 68m ONCE DAILY  ? ?*If you need a refill on your cardiac medications before your next appointment, please call your pharmacy* ? ?Follow-Up: ?[facility] and your health needs are our priority.  As part of our continuing mission to provide you with exceptional heart care, we have created designated Provider Care Teams.  These Care Teams include your primary Cardiologist (physician) and Advanced Practice Providers (APPs -  Physician Assistants and Nurse Practitioners) who all work together to provide you with the care you need, when you need it. ? ?Your next appointment:   ?3 month(s) ? ?The format for your next appointment:   ?In Person ? ?Provider:   ?KBerniece Salines DO   ? ?Other Instructions ?Please check your blood pressure at home daily, write it down. Send message via Mychart with the readings in 2 weeks for Dr. THarriet Massonto review.  ?  ? ? ?Dispo:  No follow-ups on file.  ? ?Medication Adjustments/Labs and Tests Ordered: ?Current medicines are reviewed at length with the patient today.  Concerns regarding medicines are outlined above.  ?Tests Ordered: ?Orders Placed This Encounter  ?Procedures  ? EKG 12-Lead  ? ?Medication Changes: ?Meds ordered this encounter  ?Medications  ? amLODipine (NORVASC) 10 MG tablet  ?  Sig: Take 1 tablet (10 mg total) by mouth daily.  ?  Dispense:  90 tablet  ?  Refill:  3  ? ?

## 2021-05-21 ENCOUNTER — Other Ambulatory Visit (HOSPITAL_COMMUNITY): Payer: Self-pay

## 2021-05-22 ENCOUNTER — Other Ambulatory Visit (HOSPITAL_COMMUNITY): Payer: Self-pay

## 2021-05-23 ENCOUNTER — Other Ambulatory Visit (HOSPITAL_COMMUNITY): Payer: Self-pay

## 2021-05-24 ENCOUNTER — Other Ambulatory Visit (HOSPITAL_COMMUNITY): Payer: Self-pay

## 2021-05-27 ENCOUNTER — Telehealth: Payer: Self-pay | Admitting: Pharmacist

## 2021-05-27 ENCOUNTER — Other Ambulatory Visit (HOSPITAL_COMMUNITY): Payer: Self-pay

## 2021-05-27 MED ORDER — XELJANZ 10 MG PO TABS
ORAL_TABLET | ORAL | 11 refills | Status: DC
  Filled 2021-05-27: qty 60, 30d supply, fill #0
  Filled 2021-07-17: qty 60, fill #0

## 2021-05-27 MED ORDER — URSODIOL 300 MG PO CAPS
ORAL_CAPSULE | ORAL | 4 refills | Status: DC
  Filled 2021-05-27 – 2021-06-25 (×2): qty 360, 90d supply, fill #0
  Filled 2021-10-10: qty 109, 20d supply, fill #1
  Filled 2021-10-14: qty 251, 70d supply, fill #1
  Filled 2021-10-14: qty 200, 50d supply, fill #1
  Filled 2021-10-15: qty 160, 40d supply, fill #1
  Filled 2022-01-13: qty 360, 90d supply, fill #2
  Filled 2022-04-13: qty 360, 90d supply, fill #3

## 2021-05-27 NOTE — Telephone Encounter (Signed)
Called patient to schedule an appointment for the [employer] [facility]. I was unable to reach the patient so I left a HIPAA-compliant message requesting that the patient return my call.   Luke Van Ausdall, PharmD, BCACP, CPP Clinical Pharmacist [facility] [phone]  

## 2021-05-28 ENCOUNTER — Other Ambulatory Visit (HOSPITAL_COMMUNITY): Payer: Self-pay

## 2021-06-06 ENCOUNTER — Encounter: Payer: Self-pay | Admitting: Cardiology

## 2021-06-25 ENCOUNTER — Other Ambulatory Visit (HOSPITAL_COMMUNITY): Payer: Self-pay

## 2021-06-26 ENCOUNTER — Other Ambulatory Visit (HOSPITAL_COMMUNITY): Payer: Self-pay

## 2021-06-29 ENCOUNTER — Other Ambulatory Visit (HOSPITAL_COMMUNITY): Payer: Self-pay

## 2021-07-05 ENCOUNTER — Other Ambulatory Visit (HOSPITAL_COMMUNITY): Payer: Self-pay

## 2021-07-05 MED ORDER — VALACYCLOVIR HCL 500 MG PO TABS
ORAL_TABLET | ORAL | 0 refills | Status: DC
  Filled 2021-07-05: qty 6, 3d supply, fill #0

## 2021-07-08 ENCOUNTER — Other Ambulatory Visit (HOSPITAL_COMMUNITY): Payer: Self-pay

## 2021-07-17 ENCOUNTER — Other Ambulatory Visit (HOSPITAL_COMMUNITY): Payer: Self-pay

## 2021-07-18 ENCOUNTER — Other Ambulatory Visit (HOSPITAL_COMMUNITY): Payer: Self-pay

## 2021-07-19 ENCOUNTER — Other Ambulatory Visit (HOSPITAL_COMMUNITY): Payer: Self-pay

## 2021-07-22 ENCOUNTER — Ambulatory Visit: Payer: No Typology Code available for payment source | Attending: Cardiology | Admitting: Pharmacist

## 2021-07-22 ENCOUNTER — Other Ambulatory Visit (HOSPITAL_COMMUNITY): Payer: Self-pay

## 2021-07-22 DIAGNOSIS — Z79899 Other long term (current) drug therapy: Secondary | ICD-10-CM

## 2021-07-22 MED ORDER — XELJANZ 10 MG PO TABS
ORAL_TABLET | ORAL | 11 refills | Status: DC
  Filled 2021-07-22: qty 60, 30d supply, fill #0
  Filled 2021-08-14: qty 60, 30d supply, fill #1
  Filled 2021-09-11: qty 60, 30d supply, fill #2
  Filled 2021-10-11: qty 60, 30d supply, fill #3
  Filled 2021-11-06: qty 60, 30d supply, fill #4

## 2021-07-22 NOTE — Progress Notes (Signed)
S: Patient presents for review of their specialty medication therapy.  Patient is currently taking Xeljanz for ulcerative colitis. Patient is managed by Dr. Collene Mares for this.   Adherence: denies any missed doses.  Efficacy: reports that this is working well for her.   Dosing:  Ulcerative colitis, moderate to severe :Maintenance: 10 mg twice daily   Monitoring: Recent UC flares: denies  S/sx of infection: denies S/sx of malignancy: denies Bone marrow suppression: denies GI adverse effects: denies Headache: denies Elevation in LFTs: denies Cardiovascular effects (decreased HR): denies  O: Lab Results  Component Value Date   WBC 4.8 04/02/2020   HGB 13.5 04/02/2020   HCT 41.0 04/02/2020   MCV 94.7 04/02/2020   PLT 283 04/02/2020      Chemistry      Component Value Date/Time   NA 137 10/16/2017 1159   K 3.5 10/16/2017 1159   CL 101 10/16/2017 1159   CO2 27 10/16/2017 1159   BUN 13 10/16/2017 1159   CREATININE 1.00 12/27/2018 0834   CREATININE 0.97 10/16/2017 1159      Component Value Date/Time   CALCIUM 9.2 10/16/2017 1159   AST 31 (H) 10/16/2017 1159   ALT 44 (H) 10/16/2017 1159   BILITOT 1.0 10/16/2017 1159      A/P: 1. Medication review: patient currently taking Xeljanz for ulcerative colitis and is tolerating it well with no adverse effects. Reviewed the medications with the patient including the following: Xeljanz (tofacitinib) is a Janus Associated Kinase Inhibitor which prevents cytokine- or growth factor-medicated gene expression and intracellular activity of immune cells, reduces circulating CD16/56+ natural killer cells, serum IgG, IgM, IgA, and C-reactive protein, and increases B cells. Adverse effects include increased risk of infection, risk of malignancy, as well as bone marrow suppression, GI upset, and decreased heart rate. . Patient should avoid live vaccinations while on this medication. Her flares are being monitored by Dr. Collene Mares, who recently instructed  patient to increase back to 10 mg po BID. No recommendations for further changes at this time.   Benard Halsted, PharmD, [place], [place] [phone]

## 2021-07-23 ENCOUNTER — Other Ambulatory Visit (HOSPITAL_COMMUNITY): Payer: Self-pay

## 2021-07-24 ENCOUNTER — Other Ambulatory Visit (HOSPITAL_COMMUNITY): Payer: Self-pay

## 2021-07-31 ENCOUNTER — Other Ambulatory Visit (HOSPITAL_COMMUNITY): Payer: Self-pay

## 2021-07-31 MED ORDER — CLENPIQ 10-3.5-12 MG-GM -GM/175ML PO SOLN
ORAL | 0 refills | Status: DC
  Filled 2021-07-31: qty 350, 1d supply, fill #0

## 2021-08-13 ENCOUNTER — Other Ambulatory Visit (HOSPITAL_COMMUNITY): Payer: Self-pay

## 2021-08-14 ENCOUNTER — Other Ambulatory Visit (HOSPITAL_COMMUNITY): Payer: Self-pay

## 2021-08-16 ENCOUNTER — Other Ambulatory Visit (HOSPITAL_COMMUNITY): Payer: Self-pay

## 2021-08-19 ENCOUNTER — Other Ambulatory Visit (HOSPITAL_COMMUNITY): Payer: Self-pay

## 2021-08-19 MED ORDER — FOLIC ACID 1 MG PO TABS
ORAL_TABLET | ORAL | 3 refills | Status: DC
  Filled 2021-08-19: qty 90, 90d supply, fill #0
  Filled 2021-11-18: qty 90, 90d supply, fill #1
  Filled 2022-02-20 (×2): qty 90, 90d supply, fill #2
  Filled 2022-05-15: qty 90, 90d supply, fill #3

## 2021-08-21 ENCOUNTER — Other Ambulatory Visit (HOSPITAL_COMMUNITY): Payer: Self-pay

## 2021-08-21 ENCOUNTER — Ambulatory Visit: Payer: No Typology Code available for payment source | Admitting: Cardiology

## 2021-08-26 ENCOUNTER — Other Ambulatory Visit (HOSPITAL_COMMUNITY): Payer: Self-pay | Admitting: Gastroenterology

## 2021-08-26 ENCOUNTER — Other Ambulatory Visit: Payer: Self-pay | Admitting: Gastroenterology

## 2021-08-26 DIAGNOSIS — R7989 Other specified abnormal findings of blood chemistry: Secondary | ICD-10-CM

## 2021-09-04 ENCOUNTER — Ambulatory Visit (HOSPITAL_COMMUNITY)
Admission: RE | Admit: 2021-09-04 | Discharge: 2021-09-04 | Disposition: A | Discharge status: Home / Self Care | Payer: No Typology Code available for payment source | Source: Ambulatory Visit | Attending: Gastroenterology | Admitting: Gastroenterology

## 2021-09-04 ENCOUNTER — Other Ambulatory Visit (HOSPITAL_COMMUNITY): Payer: Self-pay | Admitting: Gastroenterology

## 2021-09-04 DIAGNOSIS — R7989 Other specified abnormal findings of blood chemistry: Secondary | ICD-10-CM | POA: Insufficient documentation

## 2021-09-04 MED ORDER — GADOBUTROL 1 MMOL/ML IV SOLN
8.0000 mL | Freq: Once | INTRAVENOUS | Status: AC | PRN
  Administered 2021-09-04: 8 mL via INTRAVENOUS

## 2021-09-06 ENCOUNTER — Other Ambulatory Visit (HOSPITAL_COMMUNITY): Payer: Self-pay

## 2021-09-06 MED ORDER — CHOLESTYRAMINE 4 G PO PACK
PACK | ORAL | 3 refills | Status: DC
  Filled 2021-09-06: qty 180, 90d supply, fill #0
  Filled 2021-12-12: qty 180, 90d supply, fill #1
  Filled 2022-03-12: qty 180, 90d supply, fill #2
  Filled 2022-06-17 – 2022-06-18 (×2): qty 180, 90d supply, fill #3

## 2021-09-09 ENCOUNTER — Other Ambulatory Visit (HOSPITAL_COMMUNITY): Payer: Self-pay

## 2021-09-11 ENCOUNTER — Other Ambulatory Visit (HOSPITAL_COMMUNITY): Payer: Self-pay

## 2021-09-16 ENCOUNTER — Other Ambulatory Visit (HOSPITAL_COMMUNITY): Payer: Self-pay

## 2021-09-20 ENCOUNTER — Other Ambulatory Visit (HOSPITAL_COMMUNITY): Payer: Self-pay

## 2021-09-25 ENCOUNTER — Encounter: Payer: Self-pay | Admitting: Cardiology

## 2021-09-25 ENCOUNTER — Ambulatory Visit (INDEPENDENT_AMBULATORY_CARE_PROVIDER_SITE_OTHER): Payer: No Typology Code available for payment source | Admitting: Cardiology

## 2021-09-25 VITALS — BP 118/80 | HR 61 | Ht 67.0 in | Wt 185.2 lb

## 2021-09-25 DIAGNOSIS — I10 Essential (primary) hypertension: Secondary | ICD-10-CM | POA: Insufficient documentation

## 2021-09-25 NOTE — Patient Instructions (Signed)
Medication Instructions:  Your physician recommends that you continue on your current medications as directed. Please refer to the Current Medication list given to you today.  *If you need a refill on your cardiac medications before your next appointment, please call your pharmacy*   Lab Work: None If you have labs (blood work) drawn today and your tests are completely normal, you will receive your results only by: [place] (if you have MyChart) OR A paper copy in the mail If you have any lab test that is abnormal or we need to change your treatment, we will call you to review the results.   Testing/Procedures: None   Follow-Up: At [facility], you and your health needs are our priority.  As part of our continuing mission to provide you with exceptional heart care, we have created designated Provider Care Teams.  These Care Teams include your primary Cardiologist (physician) and Advanced Practice Providers (APPs -  Physician Assistants and Nurse Practitioners) who all work together to provide you with the care you need, when you need it.  We recommend signing up for the patient portal called "MyChart".  Sign up information is provided on this After Visit Summary.  MyChart is used to connect with patients for Virtual Visits (Telemedicine).  Patients are able to view lab/test results, encounter notes, upcoming appointments, etc.  Non-urgent messages can be sent to your provider as well.   To learn more about what you can do with MyChart, go to [email or web address].    Your next appointment:   1 year(s)  The format for your next appointment:   In Person  Provider:   Berniece Salines, DO     Other Instructions   Important Information About Sugar

## 2021-09-25 NOTE — Progress Notes (Signed)
Cardio-Obstetrics Clinic  Follow Up Note   Date:  09/25/2021   ID:  Jamie Duarte, DOB 1980/03/22, MRN [id number]  PCP:  Gaynelle Arabian, MD   [facility] Providers Cardiologist:  Berniece Salines, DO  Electrophysiologist:  None        Referring MD: Gaynelle Arabian, MD   Chief Complaint: " I am doing ok"  History of Present Illness:    Jamie Duarte is a 41 y.o. female [[id number]] who returns for follow up of hypertension.   Medical history includes hypertension which was diagnosed about 6 to 7 years ago currently on amlodipine 5 mg daily, HCTZ 25, metoprolol succinate 100 mg daily.  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  She is concerned that she may have lost this pregnancy at 6 weeks because she has been experiencing some bleeding and clotting.  I saw the patient on May 09, 2021 at that time she was [redacted] weeks pregnant.  She was on amlodipine we increase that to 10 mg daily.  We stopped the hydrochlorothiazide and continue to metoprolol succinate.  We discussed that if her blood pressure is not appropriate to target we would have added hydralazine.  Since I saw the patient she has lost the baby.  She is in good spirits.  She offers no complaints at this time.  Prior CV Studies Reviewed: The following studies were reviewed today: None today  Past Medical History:  Diagnosis Date   Hypertension    Ulcerative colitis ([place])    Vaginal Pap smear, abnormal     Past Surgical History:  Procedure Laterality Date   BREAST LUMPECTOMY Left 1998   CESAREAN SECTION        OB History     Gravida  3   Para  2   Term      Preterm  2   AB      Living  0      SAB      IAB      Ectopic      Multiple      Live Births   1               Current Medications: Current Meds  Medication Sig   amLODipine (NORVASC) 10 MG tablet Take 1 tablet (10 mg total) by mouth daily.   cholestyramine (QUESTRAN) 4 g packet Take by mouth as directed twice daily.   folic acid (FOLVITE) 1 MG tablet Take 1 tablet by mouth daily.   folic acid (FOLVITE) 1 MG tablet Take 1 tablet by mouth every day .   metoprolol succinate (TOPROL-XL) 100 MG 24 hr tablet    metoprolol succinate (TOPROL-XL) 100 MG 24 hr tablet Take 1 tablet by mouth daily for blood pressure   Multiple Vitamins-Minerals (MULTIVITAMIN ADULT PO) Take 1 tablet by mouth daily.   Prenatal Vit-Fe Fumarate-FA (PRENATAL PO) Take by mouth.   Sod Picosulfate-Mag Ox-Cit Acd (CLENPIQ) 10-3.5-12 MG-GM -GM/175ML SOLN Use as directed   Tofacitinib Citrate (XELJANZ) 10 MG TABS take 1 tablet by mouth twice daily   ursodiol (ACTIGALL) 300 MG capsule Take 2 cap(s) by mouth 2 times a day.   ursodiol (ACTIGALL) 500 MG tablet Take 1 tablet (500 mg total) by mouth 2 (two) times daily.   valACYclovir (VALTREX) 500  MG tablet Take 1 tablet by mouth 2 times a day     Allergies:   Patient has no known allergies.   Social History   Socioeconomic History   Marital status: Married    Spouse name: Not on file   Number of children: Not on file   Years of education: Not on file   Highest education level: Not on file  Occupational History   Not on file  Tobacco Use   Smoking status: Never   Smokeless tobacco: Never  Vaping Use   Vaping Use: Never used  Substance and Sexual Activity   Alcohol use: Never   Drug use: Never   Sexual activity: Not on file  Other Topics Concern   Not on file  Social History Narrative   Not on file   Social Determinants of Health   Financial Resource Strain: Not on file  Food Insecurity: No Food Insecurity (05/09/2021)   Hunger Vital Sign    Worried About Running Out of Food in the Last Year: Never true    Ran Out of Food in the Last Year: Never  true  Transportation Needs: Unmet Transportation Needs (05/09/2021)   PRAPARE - Hydrologist (Medical): Yes    Lack of Transportation (Non-Medical): Yes  Physical Activity: Not on file  Stress: Not on file  Social Connections: Not on file      Family History  Problem Relation Age of Onset   Healthy Mother    Cancer Mother        breast    Healthy Father    Healthy Brother    Allergic rhinitis Neg Hx    Angioedema Neg Hx    Asthma Neg Hx    Atopy Neg Hx    Eczema Neg Hx    Urticaria Neg Hx    Immunodeficiency Neg Hx       ROS:   Please see the history of present illness.     All other systems reviewed and are negative.   Labs/EKG Reviewed:    EKG:   EKG is was not  ordered today.   Recent Labs: No results found for requested labs within last 365 days.   Recent Lipid Panel No results found for: "CHOL", "TRIG", "HDL", "CHOLHDL", "LDLCALC", "LDLDIRECT"  Physical Exam:    VS:  BP 118/80   Pulse 61   Ht 5' 7"  (1.702 m)   Wt 185 lb 3.2 oz (84 kg)   LMP 09/24/2021   SpO2 100%   Breastfeeding No   BMI 29.01 kg/m     Wt Readings from Last 3 Encounters:  09/25/21 185 lb 3.2 oz (84 kg)  05/09/21 186 lb 12.8 oz (84.7 kg)  04/06/18 196 lb (88.9 kg)     GEN:  Well nourished, well developed in no acute distress HEENT: Normal NECK: No JVD; No carotid bruits LYMPHATICS: No lymphadenopathy CARDIAC: RRR, no murmurs, rubs, gallops RESPIRATORY:  Clear to auscultation without rales, wheezing or rhonchi  ABDOMEN: Soft, non-tender, non-distended MUSCULOSKELETAL:  No edema; No deformity  SKIN: Warm and dry NEUROLOGIC:  Alert and oriented x 3 PSYCHIATRIC:  Normal affect    Risk Assessment/Risk Calculators:                 ASSESSMENT & PLAN:    Chronic hypertension -blood pressure is controlled on the current regimen of amlodipine 10 mg daily as well as her metoprolol succinate.  No changes will be made.  She is planning  on  attempting another pregnancy.  So we will keep this regimen as this will be safe in pregnancy.   Patient Instructions  Medication Instructions:  Your physician recommends that you continue on your current medications as directed. Please refer to the Current Medication list given to you today.  *If you need a refill on your cardiac medications before your next appointment, please call your pharmacy*   Lab Work: None If you have labs (blood work) drawn today and your tests are completely normal, you will receive your results only by: [place] (if you have MyChart) OR A paper copy in the mail If you have any lab test that is abnormal or we need to change your treatment, we will call you to review the results.   Testing/Procedures: None   Follow-Up: At [facility], you and your health needs are our priority.  As part of our continuing mission to provide you with exceptional heart care, we have created designated Provider Care Teams.  These Care Teams include your primary Cardiologist (physician) and Advanced Practice Providers (APPs -  Physician Assistants and Nurse Practitioners) who all work together to provide you with the care you need, when you need it.  We recommend signing up for the patient portal called "MyChart".  Sign up information is provided on this After Visit Summary.  MyChart is used to connect with patients for Virtual Visits (Telemedicine).  Patients are able to view lab/test results, encounter notes, upcoming appointments, etc.  Non-urgent messages can be sent to your provider as well.   To learn more about what you can do with MyChart, go to [email or web address].    Your next appointment:   1 year(s)  The format for your next appointment:   In Person  Provider:   Berniece Salines, DO     Other Instructions   Important Information About Sugar         Dispo:  Return in about 1 year (around 09/26/2022).   Medication Adjustments/Labs and Tests  Ordered: Current medicines are reviewed at length with the patient today.  Concerns regarding medicines are outlined above.  Tests Ordered: No orders of the defined types were placed in this encounter.  Medication Changes: No orders of the defined types were placed in this encounter.

## 2021-10-08 ENCOUNTER — Other Ambulatory Visit (HOSPITAL_COMMUNITY): Payer: Self-pay

## 2021-10-08 MED ORDER — METOPROLOL SUCCINATE ER 100 MG PO TB24
100.0000 mg | ORAL_TABLET | Freq: Every day | ORAL | 4 refills | Status: DC
  Filled 2021-10-08 – 2021-12-12 (×2): qty 90, 90d supply, fill #0
  Filled 2022-03-12: qty 90, 90d supply, fill #1
  Filled 2022-06-17: qty 90, 90d supply, fill #2
  Filled 2022-09-17: qty 90, 90d supply, fill #3

## 2021-10-09 ENCOUNTER — Encounter: Payer: Self-pay | Admitting: Hematology and Oncology

## 2021-10-10 ENCOUNTER — Other Ambulatory Visit (HOSPITAL_COMMUNITY): Payer: Self-pay

## 2021-10-11 ENCOUNTER — Other Ambulatory Visit (HOSPITAL_COMMUNITY): Payer: Self-pay

## 2021-10-14 ENCOUNTER — Other Ambulatory Visit (HOSPITAL_COMMUNITY): Payer: Self-pay

## 2021-10-15 ENCOUNTER — Other Ambulatory Visit (HOSPITAL_COMMUNITY): Payer: Self-pay

## 2021-10-16 ENCOUNTER — Encounter: Payer: Self-pay | Admitting: Podiatry

## 2021-10-16 ENCOUNTER — Ambulatory Visit (INDEPENDENT_AMBULATORY_CARE_PROVIDER_SITE_OTHER): Payer: No Typology Code available for payment source | Admitting: Podiatry

## 2021-10-16 ENCOUNTER — Ambulatory Visit (INDEPENDENT_AMBULATORY_CARE_PROVIDER_SITE_OTHER): Payer: No Typology Code available for payment source

## 2021-10-16 DIAGNOSIS — M79672 Pain in left foot: Secondary | ICD-10-CM | POA: Diagnosis not present

## 2021-10-16 DIAGNOSIS — T148XXA Other injury of unspecified body region, initial encounter: Secondary | ICD-10-CM

## 2021-10-16 NOTE — Progress Notes (Signed)
Subjective:  Patient ID: Jamie Duarte, female    DOB: 08-Feb-1981,  MRN: [id number]  Chief Complaint  Patient presents with   Foot Pain    41 y.o. female presents with the above complaint.  Patient presents with complaint of left fourth metatarsal bone bruise.  Patient states is painful to touch painful to walk and has been going on for quite some time has progressive gotten worse.  She has not seen anyone else prior to seeing me.  She states that it started hurting all of a sudden and has been getting worse as she puts more weight on it.  She would like to discuss treatment options for it.  She has not been immobilized.   Review of Systems: Negative except as noted in the HPI. Denies N/V/F/Ch.  Past Medical History:  Diagnosis Date   Hypertension    Ulcerative colitis ([place])    Vaginal Pap smear, abnormal     Current Outpatient Medications:    amLODipine (NORVASC) 10 MG tablet, Take 1 tablet (10 mg total) by mouth daily., Disp: 90 tablet, Rfl: 3   cholestyramine (QUESTRAN) 4 g packet, Take by mouth as directed twice daily., Disp: 180 each, Rfl: 3   folic acid (FOLVITE) 1 MG tablet, Take 1 tablet by mouth daily., Disp: , Rfl: 4   folic acid (FOLVITE) 1 MG tablet, Take 1 tablet by mouth every day ., Disp: 90 tablet, Rfl: 3   metoprolol succinate (TOPROL-XL) 100 MG 24 hr tablet, , Disp: , Rfl:    metoprolol succinate (TOPROL-XL) 100 MG 24 hr tablet, Take 1 tablet by  mouth once a day for blood pressure., Disp: 90 tablet, Rfl: 4   Multiple Vitamins-Minerals (MULTIVITAMIN ADULT PO), Take 1 tablet by mouth daily., Disp: , Rfl:    Prenatal Vit-Fe Fumarate-FA (PRENATAL PO), Take by mouth., Disp: , Rfl:    Sod Picosulfate-Mag Ox-Cit Acd (CLENPIQ) 10-3.5-12 MG-GM -GM/175ML SOLN, Use as directed, Disp: 350 mL, Rfl: 0   Tofacitinib Citrate (XELJANZ) 10 MG TABS, take 1 tablet by mouth twice daily, Disp: 60 tablet, Rfl: 11   ursodiol (ACTIGALL) 300 MG capsule, Take 2 cap(s) by mouth 2 times a  day., Disp: 360 capsule, Rfl: 4   ursodiol (ACTIGALL) 500 MG tablet, Take 1 tablet (500 mg total) by mouth 2 (two) times daily., Disp: , Rfl:    valACYclovir (VALTREX) 500 MG tablet, Take 1 tablet by mouth 2 times a day, Disp: 6 tablet, Rfl: 0  Social History   Tobacco Use  Smoking Status Never  Smokeless Tobacco Never    No Known Allergies Objective:  There were no vitals filed for this visit. There is no height or weight on file to calculate BMI. Constitutional Well developed. Well nourished.  Vascular Dorsalis pedis pulses palpable bilaterally. Posterior tibial pulses palpable bilaterally. Capillary refill normal to all digits.  No cyanosis or clubbing noted. Pedal hair growth normal.  Neurologic Normal speech. Oriented to person, place, and time. Epicritic sensation to light touch grossly present bilaterally.  Dermatologic Nails well groomed and normal in appearance. No open wounds. No skin lesions.  Orthopedic: Left fourth metatarsal bone bruise pain along the fourth metatarsal.  No pain at the midfoot.  No flexor or extensor tendinitis noted.  No ecchymosis noted.   Radiographs: 3 views of skeletally mature adult left foot: No fractures noted no stress fracture noted.  No bony abnormalities noted no foreign body noted Assessment:   1. Bone bruise    Plan:  Patient was  evaluated and treated and all questions answered.  Left fourth metatarsal bone bruise versus possible capsulitis -I will shows that concerns were discussed with the patient in extensive detail. -Given the amount of pain that she is having she will benefit from a steroid injection to help decrease acute inflammatory component associate with pain.  Patient agrees with plan like to proceed with steroid injection.  No follow-ups on file.

## 2021-10-31 ENCOUNTER — Other Ambulatory Visit (HOSPITAL_COMMUNITY): Payer: Self-pay

## 2021-10-31 MED ORDER — CLENPIQ 10-3.5-12 MG-GM -GM/175ML PO SOLN
ORAL | 0 refills | Status: DC
  Filled 2021-10-31: qty 350, 1d supply, fill #0

## 2021-11-06 ENCOUNTER — Other Ambulatory Visit (HOSPITAL_COMMUNITY): Payer: Self-pay

## 2021-11-12 ENCOUNTER — Other Ambulatory Visit (HOSPITAL_COMMUNITY): Payer: Self-pay

## 2021-11-13 ENCOUNTER — Encounter: Payer: Self-pay | Admitting: Cardiology

## 2021-11-13 ENCOUNTER — Ambulatory Visit (INDEPENDENT_AMBULATORY_CARE_PROVIDER_SITE_OTHER): Payer: No Typology Code available for payment source | Admitting: Podiatry

## 2021-11-13 DIAGNOSIS — T148XXA Other injury of unspecified body region, initial encounter: Secondary | ICD-10-CM | POA: Diagnosis not present

## 2021-11-13 DIAGNOSIS — Q666 Other congenital valgus deformities of feet: Secondary | ICD-10-CM

## 2021-11-13 NOTE — Progress Notes (Signed)
Subjective:  Patient ID: Jamie Duarte, female    DOB: [birth year],  MRN: [id number]  Chief Complaint  Patient presents with   Foot Pain    Patient came in today for a left foot bruise bone, patient states she is doing much better, patient is having some intermit pain that throbs, she rates this pain a 3 out of 10, TX Cam Boot     41 y.o. female presents with the above complaint.  Patient presents with follow-up of left fourth metatarsal bone bruise.  She states she is doing a lot better.  The cam boot immobilization helped considerably.  No acute complaints.  She would like to discuss next treatment plan she is interested in orthotics   Review of Systems: Negative except as noted in the HPI. Denies N/V/F/Ch.  Past Medical History:  Diagnosis Date   Hypertension    Ulcerative colitis ([place])    Vaginal Pap smear, abnormal     Current Outpatient Medications:    amLODipine (NORVASC) 10 MG tablet, Take 1 tablet (10 mg total) by mouth daily., Disp: 90 tablet, Rfl: 3   cholestyramine (QUESTRAN) 4 g packet, Take by mouth as directed twice daily., Disp: 180 each, Rfl: 3   folic acid (FOLVITE) 1 MG tablet, Take 1 tablet by mouth daily., Disp: , Rfl: 4   folic acid (FOLVITE) 1 MG tablet, Take 1 tablet by mouth every day ., Disp: 90 tablet, Rfl: 3   metoprolol succinate (TOPROL-XL) 100 MG 24 hr tablet, , Disp: , Rfl:    metoprolol succinate (TOPROL-XL) 100 MG 24 hr tablet, Take 1 tablet by  mouth once a day for blood pressure., Disp: 90 tablet, Rfl: 4   Multiple Vitamins-Minerals (MULTIVITAMIN ADULT PO), Take 1 tablet by mouth daily., Disp: , Rfl:    Prenatal Vit-Fe Fumarate-FA (PRENATAL PO), Take by mouth., Disp: , Rfl:    Sod Picosulfate-Mag Ox-Cit Acd (CLENPIQ) 10-3.5-12 MG-GM -GM/175ML SOLN, Use as directed, Disp: 350 mL, Rfl: 0   Sod Picosulfate-Mag Ox-Cit Acd (CLENPIQ) 10-3.5-12 MG-GM -GM/175ML SOLN, Use as directed per office instructions, Disp: 350 mL, Rfl: 0   Tofacitinib Citrate  (XELJANZ) 10 MG TABS, take 1 tablet by mouth twice daily, Disp: 60 tablet, Rfl: 11   ursodiol (ACTIGALL) 300 MG capsule, Take 2 cap(s) by mouth 2 times a day., Disp: 360 capsule, Rfl: 4   ursodiol (ACTIGALL) 500 MG tablet, Take 1 tablet (500 mg total) by mouth 2 (two) times daily., Disp: , Rfl:    valACYclovir (VALTREX) 500 MG tablet, Take 1 tablet by mouth 2 times a day, Disp: 6 tablet, Rfl: 0  Social History   Tobacco Use  Smoking Status Never  Smokeless Tobacco Never    No Known Allergies Objective:  There were no vitals filed for this visit. There is no height or weight on file to calculate BMI. Constitutional Well developed. Well nourished.  Vascular Dorsalis pedis pulses palpable bilaterally. Posterior tibial pulses palpable bilaterally. Capillary refill normal to all digits.  No cyanosis or clubbing noted. Pedal hair growth normal.  Neurologic Normal speech. Oriented to person, place, and time. Epicritic sensation to light touch grossly present bilaterally.  Dermatologic Nails well groomed and normal in appearance. No open wounds. No skin lesions.  Orthopedic: No further left fourth metatarsal bone bruise pain along the fourth metatarsal.  No pain at the midfoot.  No flexor or extensor tendinitis noted.  No ecchymosis noted.   Radiographs: 3 views of skeletally mature adult left foot: No fractures noted  no stress fracture noted.  No bony abnormalities noted no foreign body noted3 views of skeletally mature adult left foot: No signs of stress fracture noted no bony abnormalities identified.  No osteoarthritic changes noted pes planovalgus foot structure noted decreasing [facility] increasing talar declination angle anterior break in the cyma line no elevatus noted. Assessment:   1. Bone bruise   2. Pes planovalgus     Plan:  Patient was evaluated and treated and all questions answered.  Left fourth metatarsal bone bruise versus possible  capsulitis -I will shows that concerns were discussed with the patient in extensive detail. -Clinically her pain has completely resolved with cam boot immobilization.  At this time I discussed shoe gear modification offloading padding protecting.  She states understanding  Pes planovalgus -I explained to patient the etiology of pes planovalgus and relationship with Planter fasciitis and various treatment options were discussed.  Given patient foot structure in the setting of Planter fasciitis I believe patient will benefit from custom-made orthotics to help control the hindfoot motion support the arch of the foot and take the stress away from plantar fascial.  Patient agrees with the plan like to proceed with orthotics -Patient was casted for orthotics   No follow-ups on file.

## 2021-11-13 NOTE — Telephone Encounter (Signed)
Pt scheduled for 10/3 with Dr. Harriet Masson.

## 2021-11-18 ENCOUNTER — Other Ambulatory Visit (HOSPITAL_COMMUNITY): Payer: Self-pay

## 2021-11-19 ENCOUNTER — Ambulatory Visit: Payer: No Typology Code available for payment source | Attending: Cardiology | Admitting: Cardiology

## 2021-11-19 ENCOUNTER — Encounter: Payer: Self-pay | Admitting: Cardiology

## 2021-11-19 ENCOUNTER — Other Ambulatory Visit (HOSPITAL_COMMUNITY): Payer: Self-pay

## 2021-11-19 VITALS — BP 120/70 | HR 70 | Ht 67.0 in | Wt 175.2 lb

## 2021-11-19 DIAGNOSIS — O10919 Unspecified pre-existing hypertension complicating pregnancy, unspecified trimester: Secondary | ICD-10-CM | POA: Diagnosis not present

## 2021-11-19 MED ORDER — MESALAMINE 1.2 G PO TBEC
2.4000 g | DELAYED_RELEASE_TABLET | ORAL | 3 refills | Status: DC
  Filled 2021-11-19: qty 360, 90d supply, fill #0
  Filled 2022-02-20 (×2): qty 360, 90d supply, fill #1
  Filled 2022-05-15: qty 360, 90d supply, fill #2
  Filled 2022-08-22 – 2022-08-25 (×2): qty 360, 90d supply, fill #3

## 2021-11-19 NOTE — Progress Notes (Signed)
Cardio-Obstetrics Clinic  Follow Up Note   Date:  11/19/2021   ID:  Jamie Duarte, DOB 04/04/1980, MRN [id number]  PCP:  Gaynelle Arabian, MD   [facility] Providers Cardiologist:  Berniece Salines, DO  Electrophysiologist:  None        Referring MD: Gaynelle Arabian, MD   Chief Complaint: " I am doing ok"  History of Present Illness:    Jamie Duarte is a 41 y.o. female [[id number]] who returns for follow up of hypertension.   Medical history includes hypertension which was diagnosed about 6 to 7 years ago currently on amlodipine 5 mg daily, HCTZ 25, metoprolol succinate 100 mg daily.  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  She is concerned that she may have lost this pregnancy at 6 weeks because she has been experiencing some bleeding and clotting.  I saw the patient on May 09, 2021 at that time she was [redacted] weeks pregnant.  She was on amlodipine we increase that to 10 mg daily.  We stopped the hydrochlorothiazide and continue to metoprolol succinate.  We discussed that if her blood pressure is not appropriate to target we would have added hydralazine.  I saw the patient 09/25/2021 at time she had lost the baby.  She is here today she is happy to report that she is now almost [redacted] weeks pregnant.  Very happy for the patient  Prior CV Studies Reviewed: The following studies were reviewed today: None today  Past Medical History:  Diagnosis Date   Hypertension    Ulcerative colitis ([place])    Vaginal Pap smear, abnormal     Past Surgical History:  Procedure Laterality Date   BREAST LUMPECTOMY Left 1998   CESAREAN SECTION        OB History     Gravida  4   Para  2   Term      Preterm  2   AB      Living  0      SAB       IAB      Ectopic      Multiple      Live Births  1               Current Medications: Current Meds  Medication Sig   amLODipine (NORVASC) 10 MG tablet Take 1 tablet (10 mg total) by mouth daily.   cholestyramine (QUESTRAN) 4 g packet Take by mouth as directed twice daily.   folic acid (FOLVITE) 1 MG tablet Take 1 tablet by mouth every day .   metoprolol succinate (TOPROL-XL) 100 MG 24 hr tablet Take 1 tablet by  mouth once a day for blood pressure.   Prenatal Vit-Fe Fumarate-FA (PRENATAL PO) Take by mouth.   ursodiol (ACTIGALL) 300 MG capsule Take 2 cap(s) by mouth 2 times a day.   valACYclovir (VALTREX) 500 MG tablet Take 1 tablet by mouth 2 times a day (Patient taking differently: as needed.)   [DISCONTINUED] folic acid (FOLVITE) 1 MG tablet Take 1 tablet by mouth daily.   [DISCONTINUED] metoprolol succinate (TOPROL-XL) 100 MG 24 hr tablet      Allergies:   Patient has no known allergies.   Social History   Socioeconomic History   Marital status: Married  Spouse name: Not on file   Number of children: Not on file   Years of education: Not on file   Highest education level: Not on file  Occupational History   Not on file  Tobacco Use   Smoking status: Never   Smokeless tobacco: Never  Vaping Use   Vaping Use: Never used  Substance and Sexual Activity   Alcohol use: Never   Drug use: Never   Sexual activity: Not on file  Other Topics Concern   Not on file  Social History Narrative   Not on file   Social Determinants of Health   Financial Resource Strain: Not on file  Food Insecurity: No Food Insecurity (05/09/2021)   Hunger Vital Sign    Worried About Running Out of Food in the Last Year: Never true    Ran Out of Food in the Last Year: Never true  Transportation Needs: Unmet Transportation Needs (05/09/2021)   PRAPARE - Hydrologist (Medical): Yes    Lack of Transportation (Non-Medical): Yes  Physical Activity: Not on  file  Stress: Not on file  Social Connections: Not on file      Family History  Problem Relation Age of Onset   Healthy Mother    Cancer Mother        breast    Healthy Father    Healthy Brother    Allergic rhinitis Neg Hx    Angioedema Neg Hx    Asthma Neg Hx    Atopy Neg Hx    Eczema Neg Hx    Urticaria Neg Hx    Immunodeficiency Neg Hx       ROS:   Please see the history of present illness.     All other systems reviewed and are negative.   Labs/EKG Reviewed:    EKG:   EKG is was not  ordered today.   Recent Labs: No results found for requested labs within last 365 days.   Recent Lipid Panel No results found for: "CHOL", "TRIG", "HDL", "CHOLHDL", "LDLCALC", "LDLDIRECT"  Physical Exam:    VS:  BP 120/70   Pulse 70   Ht 5' 7"  (1.702 m)   Wt 175 lb 3.2 oz (79.5 kg)   LMP 09/25/2021   SpO2 97%   BMI 27.44 kg/m     Wt Readings from Last 3 Encounters:  11/19/21 175 lb 3.2 oz (79.5 kg)  09/25/21 185 lb 3.2 oz (84 kg)  05/09/21 186 lb 12.8 oz (84.7 kg)     GEN:  Well nourished, well developed in no acute distress HEENT: Normal NECK: No JVD; No carotid bruits LYMPHATICS: No lymphadenopathy CARDIAC: RRR, no murmurs, rubs, gallops RESPIRATORY:  Clear to auscultation without rales, wheezing or rhonchi  ABDOMEN: Soft, non-tender, non-distended MUSCULOSKELETAL:  No edema; No deformity  SKIN: Warm and dry NEUROLOGIC:  Alert and oriented x 3 PSYCHIATRIC:  Normal affect    Risk Assessment/Risk Calculators:                 ASSESSMENT & PLAN:    Chronic hypertension in pregnancy - her blood pressure  is within normal today.  I am very happy for her. She will remain on her Amlodipine.   Patient Instructions  Medication Instructions:  Your physician recommends that you continue on your current medications as directed. Please refer to the Current Medication list given to you today.  *If you need a refill on your cardiac medications before your next  appointment,  please call your pharmacy*   Lab Work: None   Testing/Procedures: None   Follow-Up: At [facility], you and your health needs are our priority.  As part of our continuing mission to provide you with exceptional heart care, we have created designated Provider Care Teams.  These Care Teams include your primary Cardiologist (physician) and Advanced Practice Providers (APPs -  Physician Assistants and Nurse Practitioners) who all work together to provide you with the care you need, when you need it.  We recommend signing up for the patient portal called "MyChart".  Sign up information is provided on this After Visit Summary.  MyChart is used to connect with patients for Virtual Visits (Telemedicine).  Patients are able to view lab/test results, encounter notes, upcoming appointments, etc.  Non-urgent messages can be sent to your provider as well.   To learn more about what you can do with MyChart, go to [email or web address].    Your next appointment:   12 week(s)  The format for your next appointment:   Virtual Visit   Provider:   Berniece Salines, DO     Other Instructions   Important Information About Sugar         Dispo:  Return in about 12 weeks (around 02/11/2022).   Medication Adjustments/Labs and Tests Ordered: Current medicines are reviewed at length with the patient today.  Concerns regarding medicines are outlined above.  Tests Ordered: No orders of the defined types were placed in this encounter.  Medication Changes: No orders of the defined types were placed in this encounter.

## 2021-11-19 NOTE — Patient Instructions (Signed)
Medication Instructions:  Your physician recommends that you continue on your current medications as directed. Please refer to the Current Medication list given to you today.  *If you need a refill on your cardiac medications before your next appointment, please call your pharmacy*   Lab Work: None   Testing/Procedures: None   Follow-Up: At [facility], you and your health needs are our priority.  As part of our continuing mission to provide you with exceptional heart care, we have created designated Provider Care Teams.  These Care Teams include your primary Cardiologist (physician) and Advanced Practice Providers (APPs -  Physician Assistants and Nurse Practitioners) who all work together to provide you with the care you need, when you need it.  We recommend signing up for the patient portal called "MyChart".  Sign up information is provided on this After Visit Summary.  MyChart is used to connect with patients for Virtual Visits (Telemedicine).  Patients are able to view lab/test results, encounter notes, upcoming appointments, etc.  Non-urgent messages can be sent to your provider as well.   To learn more about what you can do with MyChart, go to [email or web address].    Your next appointment:   12 week(s)  The format for your next appointment:   Virtual Visit   Provider:   Berniece Salines, DO     Other Instructions   Important Information About Sugar

## 2021-11-21 ENCOUNTER — Other Ambulatory Visit (HOSPITAL_COMMUNITY): Payer: Self-pay

## 2021-12-03 ENCOUNTER — Other Ambulatory Visit (HOSPITAL_COMMUNITY): Payer: Self-pay

## 2021-12-03 LAB — OB RESULTS CONSOLE RPR: RPR: NONREACTIVE

## 2021-12-03 LAB — OB RESULTS CONSOLE ABO/RH: RH Type: POSITIVE

## 2021-12-03 LAB — HEPATITIS C ANTIBODY: HCV Ab: NEGATIVE

## 2021-12-03 LAB — OB RESULTS CONSOLE RUBELLA ANTIBODY, IGM: Rubella: IMMUNE

## 2021-12-03 LAB — OB RESULTS CONSOLE ANTIBODY SCREEN: Antibody Screen: NEGATIVE

## 2021-12-03 LAB — OB RESULTS CONSOLE HIV ANTIBODY (ROUTINE TESTING): HIV: NONREACTIVE

## 2021-12-03 LAB — OB RESULTS CONSOLE HEPATITIS B SURFACE ANTIGEN: Hepatitis B Surface Ag: NEGATIVE

## 2021-12-05 ENCOUNTER — Other Ambulatory Visit (HOSPITAL_COMMUNITY): Payer: Self-pay

## 2021-12-09 ENCOUNTER — Ambulatory Visit: Payer: No Typology Code available for payment source | Admitting: *Deleted

## 2021-12-09 DIAGNOSIS — Q666 Other congenital valgus deformities of feet: Secondary | ICD-10-CM

## 2021-12-09 NOTE — Progress Notes (Signed)
Patient presents today to pick up custom molded foot orthotics, diagnosed with pes planovalgus by Dr. Posey Pronto.   Orthotics were dispensed and fit was satisfactory. Reviewed instructions for break-in and wear. Written instructions given to patient.  Patient will follow up as needed.

## 2021-12-12 ENCOUNTER — Other Ambulatory Visit (HOSPITAL_COMMUNITY): Payer: Self-pay

## 2021-12-13 ENCOUNTER — Other Ambulatory Visit (HOSPITAL_COMMUNITY): Payer: Self-pay

## 2021-12-13 ENCOUNTER — Other Ambulatory Visit: Payer: Self-pay | Admitting: Obstetrics and Gynecology

## 2021-12-13 DIAGNOSIS — O10919 Unspecified pre-existing hypertension complicating pregnancy, unspecified trimester: Secondary | ICD-10-CM

## 2021-12-13 DIAGNOSIS — Z363 Encounter for antenatal screening for malformations: Secondary | ICD-10-CM

## 2021-12-13 DIAGNOSIS — Z8751 Personal history of pre-term labor: Secondary | ICD-10-CM

## 2021-12-13 DIAGNOSIS — O09521 Supervision of elderly multigravida, first trimester: Secondary | ICD-10-CM

## 2021-12-13 DIAGNOSIS — K51919 Ulcerative colitis, unspecified with unspecified complications: Secondary | ICD-10-CM

## 2021-12-23 ENCOUNTER — Ambulatory Visit: Payer: No Typology Code available for payment source | Attending: Obstetrics and Gynecology

## 2021-12-23 ENCOUNTER — Ambulatory Visit
Payer: No Typology Code available for payment source | Attending: Obstetrics and Gynecology | Admitting: Obstetrics and Gynecology

## 2021-12-23 ENCOUNTER — Encounter: Payer: Self-pay | Admitting: *Deleted

## 2021-12-23 ENCOUNTER — Other Ambulatory Visit: Payer: Self-pay | Admitting: *Deleted

## 2021-12-23 ENCOUNTER — Ambulatory Visit: Payer: No Typology Code available for payment source | Admitting: *Deleted

## 2021-12-23 VITALS — BP 127/86 | HR 73

## 2021-12-23 DIAGNOSIS — O09521 Supervision of elderly multigravida, first trimester: Secondary | ICD-10-CM | POA: Insufficient documentation

## 2021-12-23 DIAGNOSIS — O10919 Unspecified pre-existing hypertension complicating pregnancy, unspecified trimester: Secondary | ICD-10-CM | POA: Insufficient documentation

## 2021-12-23 DIAGNOSIS — K51919 Ulcerative colitis, unspecified with unspecified complications: Secondary | ICD-10-CM | POA: Insufficient documentation

## 2021-12-23 DIAGNOSIS — O10911 Unspecified pre-existing hypertension complicating pregnancy, first trimester: Secondary | ICD-10-CM | POA: Insufficient documentation

## 2021-12-23 DIAGNOSIS — O34219 Maternal care for unspecified type scar from previous cesarean delivery: Secondary | ICD-10-CM | POA: Diagnosis not present

## 2021-12-23 DIAGNOSIS — Z8751 Personal history of pre-term labor: Secondary | ICD-10-CM | POA: Diagnosis not present

## 2021-12-23 DIAGNOSIS — O3411 Maternal care for benign tumor of corpus uteri, first trimester: Secondary | ICD-10-CM

## 2021-12-23 DIAGNOSIS — K8301 Primary sclerosing cholangitis: Secondary | ICD-10-CM

## 2021-12-23 DIAGNOSIS — D259 Leiomyoma of uterus, unspecified: Secondary | ICD-10-CM

## 2021-12-23 DIAGNOSIS — Z363 Encounter for antenatal screening for malformations: Secondary | ICD-10-CM | POA: Diagnosis not present

## 2021-12-23 DIAGNOSIS — O10011 Pre-existing essential hypertension complicating pregnancy, first trimester: Secondary | ICD-10-CM

## 2021-12-23 DIAGNOSIS — Z3A12 12 weeks gestation of pregnancy: Secondary | ICD-10-CM

## 2021-12-23 DIAGNOSIS — O09899 Supervision of other high risk pregnancies, unspecified trimester: Secondary | ICD-10-CM

## 2021-12-23 NOTE — Progress Notes (Signed)
Maternal-Fetal Medicine   Name: Jamie Duarte DOB: 01-Jul-1980 MRN: [id number] Referring Provider: Servando Salina, MD  I had the pleasure of seeing [name] today at the [place] [facility]. She is G3 P0200 at 12w 5d gestation and is here for first-trimester ultrasound.  Patient has several high-risk pregnancy problems and had detailed consultation on 07/31/2020 at the [facility] [place].  Her problems include -Primary sclerosing cholangitis.  Liver biopsy performed in February 2022 showed no pathological fibrosis.  MRI performed in October 2021 showed mild intrahepatic biliary ductal dilation.  Recent AST was 26 and ALT was 28.  She is being followed by her gastroenterologist, Dr. Juanita Craver.  Patient will be seeing a liver specialist at [facility] or at [facility].  She is taking Actigall for cholestasis.  Recent bile acid levels were within normal range (3.4).  She does not have symptoms of itching. -Ulcerative colitis.  Patient had discontinued Xeljanz (tofacitinib) at the onset of pregnancy and takes mesalamine.  She has not had recent flares. -Chronic hypertension.  Patient takes metoprolol succinate-XL 100 mg daily and amlodipine 10 mg daily.  Blood pressure today at her office is 127/86 mmHg.  She reports her blood pressures are well controlled. 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 -Previous classical cesarean section in 2012 as her pregnancy was complicated with preeclampsia with severe features.  She had prophylactic cerclage in that pregnancy. -History of LEEP and twin pregnancy loss at [redacted] weeks gestation (in 2002).  Patient is planning to have prophylactic cerclage in this pregnancy. -Advanced maternal age.  On cell-free fetal Linea screening, the results could not be reported because of low fetal fraction.  Ultrasound The CRL measurement is consistent with the previously established dates and good fetal heart activity seen.  No  evidence of cystic hygroma or any major anomalies.  Fetal anatomical survey that could be ascertained at this gestational age appears normal. Placenta is posterior and there is no evidence of previa or placenta accreta spectrum. 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  Our concerns include: Advanced maternal age  I discussed the significance of low fetal fraction on cell-free fetal DNA screening.  A repeat sample at a later gestational age is likely to yield the results.  Patient is planning to have cerclage.  I discussed the following options: -Repeat cell free fetal DNA screening now and have cerclage in 7 to 10 days after the results. -Undergo cerclage procedure next week and repeat cell-free fetal DNA screening in 2 weeks to increase the likelihood of obtaining results. Patient will discuss with you and communicate her decision. Discussed with Dr. Garwin Brothers.  Ulcerative colitis and primary sclerosing cholangitis (PSC) Patient does not have any flares of ulcerative colitis.  We expect good pregnancy outcomes.  Her recent liver enzymes are within normal range.  Patient will be seeing a liver specialist soon. Chronic hypertension I discussed the possible adverse outcomes of uncontrolled severe chronic hypertension including maternal complications (stroke), fetal growth restriction, endorgan damage and placental abruption. Patient is taking antihypertensive medications and her blood pressures are within normal range.  I discussed ultrasound protocol that includes serial fetal growth assessments, and weekly antenatal testing from [redacted] weeks gestation.  Previous classical cesarean delivery I reassured the patient of posterior placenta and that there is no evidence of placental previa or placenta accreta spectrum.  I recommend delivery at [redacted] weeks gestation.  Increased anti-SSA (Ro) antibodies I discussed the significance of Ro antibodies that it  can be associated with congenital  heart block in 1% to 2% of infants.  I recommended fetal echocardiography starting at [redacted] weeks gestation.  We have requested an appointment for fetal echocardiography.  Recommendations -An appointment was made for her to return in 7 weeks for detailed fetal anatomical survey and transvaginal ultrasound to evaluate the cervix. -Fetal growth assessment every 4 weeks. -Weekly BPP from [redacted] weeks gestation till delivery. -Repeat cesarean delivery at [redacted] weeks gestation. -Prophylactic cerclage in 1 to 2 weeks. -Option of cell free fetal DNA screening was discussed. -If repeat cell-free fetal Linea screening showed abnormal or not reportable result, I recommend genetic counseling to discuss invasive testing. -We have requested an appointment for fetal echocardiography. -Consultation with liver specialist. -Continue antihypertensives. -Initiate low-dose aspirin for preeclampsia prophylaxis in 1 to 2 weeks.   Thank you for consultation.  If you have any questions or concerns, please contact me the [facility].  Consultation including face-to-face (more than 50%) counseling 40 minutes.

## 2021-12-25 ENCOUNTER — Telehealth (HOSPITAL_COMMUNITY): Payer: Self-pay | Admitting: *Deleted

## 2021-12-25 ENCOUNTER — Encounter (HOSPITAL_COMMUNITY): Payer: Self-pay | Admitting: *Deleted

## 2021-12-25 NOTE — Telephone Encounter (Signed)
Preadmission screen  

## 2021-12-28 ENCOUNTER — Ambulatory Visit (HOSPITAL_COMMUNITY)
Admission: RE | Admit: 2021-12-28 | Discharge: 2021-12-28 | Disposition: A | Discharge status: Home / Self Care | Payer: No Typology Code available for payment source | Attending: Obstetrics and Gynecology | Admitting: Obstetrics and Gynecology

## 2021-12-28 ENCOUNTER — Inpatient Hospital Stay (HOSPITAL_COMMUNITY): Payer: No Typology Code available for payment source | Admitting: Anesthesiology

## 2021-12-28 ENCOUNTER — Other Ambulatory Visit: Payer: Self-pay

## 2021-12-28 ENCOUNTER — Encounter (HOSPITAL_COMMUNITY): Payer: Self-pay | Admitting: Obstetrics and Gynecology

## 2021-12-28 ENCOUNTER — Encounter (HOSPITAL_COMMUNITY)
Admission: RE | Disposition: A | Discharge status: Home / Self Care | Payer: Self-pay | Source: Home / Self Care | Attending: Obstetrics and Gynecology

## 2021-12-28 DIAGNOSIS — O10911 Unspecified pre-existing hypertension complicating pregnancy, first trimester: Secondary | ICD-10-CM | POA: Diagnosis not present

## 2021-12-28 DIAGNOSIS — O3432 Maternal care for cervical incompetence, second trimester: Secondary | ICD-10-CM | POA: Diagnosis present

## 2021-12-28 DIAGNOSIS — Z3A13 13 weeks gestation of pregnancy: Secondary | ICD-10-CM | POA: Insufficient documentation

## 2021-12-28 DIAGNOSIS — O10919 Unspecified pre-existing hypertension complicating pregnancy, unspecified trimester: Secondary | ICD-10-CM

## 2021-12-28 DIAGNOSIS — O3431 Maternal care for cervical incompetence, first trimester: Secondary | ICD-10-CM

## 2021-12-28 HISTORY — PX: CERVICAL CERCLAGE: SHX1329

## 2021-12-28 LAB — CBC
HCT: 33 % — ABNORMAL LOW (ref 36.0–46.0)
Hemoglobin: 11.1 g/dL — ABNORMAL LOW (ref 12.0–15.0)
MCH: 31.1 pg (ref 26.0–34.0)
MCHC: 33.6 g/dL (ref 30.0–36.0)
MCV: 92.4 fL (ref 80.0–100.0)
Platelets: 386 10*3/uL (ref 150–400)
RBC: 3.57 MIL/uL — ABNORMAL LOW (ref 3.87–5.11)
RDW: 12 % (ref 11.5–15.5)
WBC: 5.5 10*3/uL (ref 4.0–10.5)
nRBC: 0 % (ref 0.0–0.2)

## 2021-12-28 LAB — TYPE AND SCREEN
ABO/RH(D): O POS
Antibody Screen: NEGATIVE

## 2021-12-28 SURGERY — CERCLAGE, CERVIX, VAGINAL APPROACH
Anesthesia: Spinal

## 2021-12-28 MED ORDER — POVIDONE-IODINE 10 % EX SWAB
2.0000 | Freq: Once | CUTANEOUS | Status: AC
  Administered 2021-12-28: 2 via TOPICAL

## 2021-12-28 MED ORDER — OXYCODONE HCL 5 MG/5ML PO SOLN: 5.0000 mg | Freq: Once | ORAL | Status: DC | PRN

## 2021-12-28 MED ORDER — CHLOROPROCAINE HCL (PF) 3 % IJ SOLN
INTRAMUSCULAR | Status: AC
  Filled 2021-12-28: qty 20

## 2021-12-28 MED ORDER — ACETAMINOPHEN 10 MG/ML IV SOLN
INTRAVENOUS | Status: DC | PRN
  Administered 2021-12-28: 1000 mg via INTRAVENOUS

## 2021-12-28 MED ORDER — FENTANYL CITRATE (PF) 100 MCG/2ML IJ SOLN: 25.0000 ug | INTRAMUSCULAR | Status: DC | PRN

## 2021-12-28 MED ORDER — SODIUM CHLORIDE 0.9 % IR SOLN
Status: DC | PRN
  Administered 2021-12-28: 1000 mL

## 2021-12-28 MED ORDER — FENTANYL CITRATE (PF) 100 MCG/2ML IJ SOLN
INTRAMUSCULAR | Status: AC
  Filled 2021-12-28: qty 2

## 2021-12-28 MED ORDER — OXYCODONE HCL 5 MG PO TABS: 5.0000 mg | ORAL_TABLET | Freq: Once | ORAL | Status: DC | PRN

## 2021-12-28 MED ORDER — SOD CITRATE-CITRIC ACID 500-334 MG/5ML PO SOLN
30.0000 mL | ORAL | Status: AC
  Administered 2021-12-28: 30 mL via ORAL

## 2021-12-28 MED ORDER — ONDANSETRON HCL 4 MG/2ML IJ SOLN: 4.0000 mg | Freq: Once | INTRAMUSCULAR | Status: DC | PRN

## 2021-12-28 MED ORDER — CHLOROPROCAINE HCL (PF) 3 % IJ SOLN
INTRAMUSCULAR | Status: DC | PRN
  Administered 2021-12-28: 1.7 mL

## 2021-12-28 MED ORDER — SOD CITRATE-CITRIC ACID 500-334 MG/5ML PO SOLN
ORAL | Status: AC
  Filled 2021-12-28: qty 30

## 2021-12-28 MED ORDER — FENTANYL CITRATE (PF) 100 MCG/2ML IJ SOLN
INTRAMUSCULAR | Status: DC | PRN
  Administered 2021-12-28: 15 ug via INTRAVENOUS

## 2021-12-28 MED ORDER — LACTATED RINGERS IV SOLN: INTRAVENOUS | Status: DC

## 2021-12-28 SURGICAL SUPPLY — 18 items
CANISTER SUCT 3000ML PPV (MISCELLANEOUS) ×1 IMPLANT
CATH FOLEY 2WAY SLVR 30CC 16FR (CATHETERS) IMPLANT
GLOVE BIOGEL PI IND STRL 7.0 (GLOVE) ×3 IMPLANT
GLOVE ECLIPSE 6.5 STRL STRAW (GLOVE) ×1 IMPLANT
GOWN STRL REUS W/TWL LRG LVL3 (GOWN DISPOSABLE) ×2 IMPLANT
NS IRRIG 1000ML POUR BTL (IV SOLUTION) ×1 IMPLANT
PACK VAGINAL MINOR WOMEN LF (CUSTOM PROCEDURE TRAY) ×1 IMPLANT
PAD OB MATERNITY 4.3X12.25 (PERSONAL CARE ITEMS) ×1 IMPLANT
PAD PREP 24X48 CUFFED NSTRL (MISCELLANEOUS) ×1 IMPLANT
SUT ETHIBOND  5 (SUTURE) ×1
SUT ETHIBOND 5 (SUTURE) ×1 IMPLANT
SUT PROLENE 1 CTX 30  8455H (SUTURE) ×1
SUT PROLENE 1 CTX 30 8455H (SUTURE) ×1 IMPLANT
SYR BULB IRRIGATION 50ML (SYRINGE) ×1 IMPLANT
TOWEL OR 17X24 6PK STRL BLUE (TOWEL DISPOSABLE) ×2 IMPLANT
TRAY FOLEY W/BAG SLVR 14FR (SET/KITS/TRAYS/PACK) ×1 IMPLANT
TUBING NON-CON 1/4 X 20 CONN (TUBING) IMPLANT
YANKAUER SUCT BULB TIP NO VENT (SUCTIONS) IMPLANT

## 2021-12-28 NOTE — Anesthesia Procedure Notes (Signed)
Spinal  Patient location during procedure: OR Start time: 12/28/2021 11:40 AM End time: 12/28/2021 11:45 AM Reason for block: surgical anesthesia Staffing Performed: anesthesiologist  Anesthesiologist: Pervis Hocking, DO Performed by: Pervis Hocking, DO Authorized by: Pervis Hocking, DO   Preanesthetic Checklist Completed: patient identified, IV checked, risks and benefits discussed, surgical consent, monitors and equipment checked, pre-op evaluation and timeout performed Spinal Block Patient position: sitting Prep: DuraPrep and site prepped and draped Patient monitoring: cardiac monitor, continuous pulse ox and blood pressure Approach: midline Location: L3-4 Injection technique: single-shot Needle Needle type: Pencan  Needle gauge: 24 G Needle length: 9 cm Assessment Sensory level: T6 Events: CSF return Additional Notes Functioning IV was confirmed and monitors were applied. Sterile prep and drape, including hand hygiene and sterile gloves were used. The patient was positioned and the spine was prepped. The skin was anesthetized with lidocaine.  Free flow of clear CSF was obtained prior to injecting local anesthetic into the CSF.  The spinal needle aspirated freely following injection.  The needle was carefully withdrawn.  The patient tolerated the procedure well.

## 2021-12-28 NOTE — Anesthesia Postprocedure Evaluation (Signed)
Anesthesia Post Note  Patient: Jamie Duarte  Procedure(s) Performed: CERCLAGE CERVICAL     Patient location during evaluation: PACU Anesthesia Type: Spinal Level of consciousness: awake and alert and oriented Pain management: pain level controlled Vital Signs Assessment: post-procedure vital signs reviewed and stable Respiratory status: spontaneous breathing, nonlabored ventilation and respiratory function stable Cardiovascular status: blood pressure returned to baseline and stable Postop Assessment: no headache, no backache and spinal receding Anesthetic complications: no   No notable events documented.  Last Vitals:  Vitals:   12/28/21 1245 12/28/21 1300  BP: 114/61 119/73  Pulse:  60  Resp: 13 18  Temp: 36.5 C   SpO2:  96%    Last Pain:  Vitals:   12/28/21 0805  TempSrc: Oral   Pain Goal:    LLE Motor Response: Purposeful movement (12/28/21 1300) LLE Sensation: Tingling (12/28/21 1300) RLE Motor Response: Purposeful movement (12/28/21 1300) RLE Sensation: Tingling (12/28/21 1300)     Epidural/Spinal Function Cutaneous sensation: Able to Wiggle Toes (12/28/21 1245), Patient able to flex knees: No (12/28/21 1245), Patient able to lift hips off bed: No (12/28/21 1245), Back pain beyond tenderness at insertion site: No (12/28/21 1245), Progressively worsening motor and/or sensory loss: No (12/28/21 1245), Bowel and/or bladder incontinence post epidural: No (12/28/21 Stella)  Pervis Hocking

## 2021-12-28 NOTE — H&P (Signed)
Jamie Duarte is a 42 y.o. female presenting for cervical cerclage due to cervical incompetence. 13 1/[redacted] wk gestation. Poor OB hx. OB History     Gravida  4   Para  2   Term      Preterm  2   AB  1   Living  0      SAB  1   IAB      Ectopic      Multiple      Live Births  1          Past Medical History:  Diagnosis Date   Family history of adverse reaction to anesthesia    mom hard to wake up   Herpes    Hypertension    PSC (primary sclerosing cholangitis)    Ulcerative colitis ([place])    Vaginal Pap smear, abnormal    Past Surgical History:  Procedure Laterality Date   BREAST LUMPECTOMY Left 1998   CERCLAGE REMOVAL     CESAREAN SECTION     LEEP     Family History: family history includes Cancer in her mother; Healthy in her brother, father, and mother. Social History:  reports that she has never smoked. She has never used smokeless tobacco. She reports that she does not drink alcohol and does not use drugs.     Maternal Diabetes: No Genetic Screening: Abnormal:  Results: Other: insufficient cells Maternal Ultrasounds/Referrals: Other: Fetal Ultrasounds or other Referrals:  Referred to Materal Fetal Medicine  Maternal Substance Abuse:  No Significant Maternal Medications:  Meds include: Other:  Significant Maternal Lab Results:  None Number of Prenatal Visits: Other Comments:    Review of Systems  All other systems reviewed and are negative.  History   Last menstrual period 09/25/2021. Maternal Exam:  Introitus: Normal vulva.   Physical Exam Constitutional:      Appearance: Normal appearance.  HENT:     Head: Atraumatic.  Eyes:     Extraocular Movements: Extraocular movements intact.  Cardiovascular:     Rate and Rhythm: Regular rhythm.     Heart sounds: Normal heart sounds.  Pulmonary:     Breath sounds: Normal breath sounds.  Abdominal:     Palpations: Abdomen is soft.     Comments: Pfannensteil skin incision  Genitourinary:     General: Normal vulva.     Comments: Gravid 13-14 wk Cervix closed Musculoskeletal:        General: No swelling.     Cervical back: Neck supple.  Skin:    General: Skin is warm and dry.  Neurological:     General: No focal deficit present.     Mental Status: She is alert and oriented to person, place, and time.  Psychiatric:        Mood and Affect: Mood normal.        Behavior: Behavior normal.     Prenatal labs: ABO, Rh:  O positive Antibody:  neg Rubella:  Immune RPR:   NR HBsAg:   neg HIV:   neg GBS:   not done as yet [place] MFM OB COMPLETE LESS THAN 14 WEEKS  Result Date: 12/23/2021 ----------------------------------------------------------------------  OBSTETRICS REPORT                       (Signed Final 12/23/2021 10:59 am) ---------------------------------------------------------------------- Patient Info  ID #:       [id number]  D.O.B.:  Jul 18, 1980 (40 yrs)  Name:       Jamie Duarte                Visit Date: 12/23/2021 07:44 am ---------------------------------------------------------------------- Performed By  Attending:        Tama High MD        Ref. Address:     [facility]                                                             OB/GYN                                                             [address]                                                             [place]                                                             [place]  Performed By:     Eveline Keto         [facility]                                                             [place] [facility]                                                             [facility]  Referred By:      Alanda Slim  Jasia Hiltunen MD ---------------------------------------------------------------------- Orders  #   Description                           Code        Ordered By  1  [place] MFM OB COMP LESS THAN              76801.4     Taysen Bushart     61 WEEKS                                          Ednamae Schiano ----------------------------------------------------------------------  #  Order #                     Accession #                Episode #  1  [id number]                   [id number]                 [id number] ---------------------------------------------------------------------- Indications  Hypertension - Chronic/Pre-existing            O10.019  Fetal or maternal indication (Ulcerative       O35.8XX0  Colitis; primary sclerosing cholangitis)  Advanced maternal age multigravida 35+,        O65.521  first trimester  Poor obstetric history: Previous preterm       O09.219  delivery, antepartum (@20  &24wks)  [redacted] weeks gestation of pregnancy                Z3A.12  History of cervical cerclage, currently        O09.299  pregnant  Cervical insufficiency, 1st                    O34.41  Uterine fibroids affecting pregnancy in first  O34.11, D25.9  trimester, antepartum  Previous cesarean delivery, antepartum         O34.219 ---------------------------------------------------------------------- Vital Signs  BP:          127/86 ---------------------------------------------------------------------- Fetal Evaluation  Num Of Fetuses:         1  Preg. Location:         Intrauterine  Gest. Sac:              Intrauterine  Fetal Heart Rate(bpm):  148  Cardiac Activity:       Observed  Amniotic Fluid  AFI FV:      Within normal limits ---------------------------------------------------------------------- Biometry  CRL:      66.6  mm     G. Age:  12w 6d                  EDD:   07/01/22 ---------------------------------------------------------------------- OB History  Gravidity:    4         Term:   0        Prem:   2        SAB:   1  TOP:          0       Ectopic:  0        Living: 0 ----------------------------------------------------------------------  Gestational Age  LMP:           12w 5d        Date:  09/25/21  EDD:   07/02/22  Best:          12w 5d     Det. By:  LMP  (09/25/21)          EDD:   07/02/22 ---------------------------------------------------------------------- Anatomy  Cranium:               Appears normal         Bladder:                Appears normal  Choroid Plexus:        Appears normal         Upper Extremities:      Visualized  Stomach:               Visualized             Lower Extremities:      Visualized ---------------------------------------------------------------------- Cervix Uterus Adnexa  Uterus  Single fibroid noted, see table below.  Right Ovary  Not visualized.  Left Ovary  Not visualized. ---------------------------------------------------------------------- Myomas  Site                     L(cm)      W(cm)      D(cm)       Location  Anterior Left            4.1        3.1        4.1 ----------------------------------------------------------------------  Blood Flow                  RI       PI       Comments ---------------------------------------------------------------------- Impression  The CRL measurement is consistent with the previously  established dates and good fetal heart activity seen.  No  evidence of cystic hygroma or any major anomalies.  Fetal  anatomical survey that could be ascertained at this  gestational age appears normal.  Placenta is posterior and there is no evidence of previa or  placenta accreta spectrum.  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 today at the [place]  [facility]. She is G3 P0200 at 12w 5d gestation  and is here for first-trimester ultrasound.  Patient has several  high-risk pregnancy problems and had detailed consultation  on 07/31/2020 at the [facility] [place].  Her  problems include  -Primary  sclerosing cholangitis.  Liver biopsy performed in  February 2022 showed no pathological fibrosis.  MRI  performed in October 2021 showed mild intrahepatic biliary  ductal dilation.  Recent AST was 26 and ALT was 28.  She is  being followed by her gastroenterologist, Dr. Juanita Craver.  Patient will be seeing a liver specialist at [facility] or at [facility].  She is taking Actigall for  cholestasis.  Recent bile acid levels were within normal range  (3.4).  She does not have symptoms of itching.  -Ulcerative colitis.  Patient had discontinued Xeljanz  (tofacitinib) at the onset of pregnancy and takes mesalamine.  She has not had recent flares.  -Chronic hypertension.  Patient takes metoprolol succinate-  XL 100 mg daily and amlodipine 10 mg daily.  Blood pressure  today at her office is 127/86 mmHg.  She reports her blood  pressures are well controlled.  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  -Previous classical cesarean section in  2012 as her  pregnancy was complicated with preeclampsia with severe  features.  She had prophylactic cerclage in that pregnancy.  -History of LEEP and twin pregnancy loss at [redacted] weeks  gestation (in 2002).  Patient is planning to have prophylactic  cerclage in this pregnancy.  -Advanced maternal age.  On cell-free fetal Linea screening,  the results could not be reported because of low fetal fraction.  Our concerns include:  Advanced maternal age  I discussed the significance of low fetal fraction on cell-free  fetal DNA screening.  A repeat sample at a later gestational  age is likely to yield the results.  Patient is planning to have  cerclage.  I discussed the following options:  -Repeat cell free fetal DNA screening now and have cerclage  in 7 to 10 days after the results.  -Undergo cerclage procedure next week and repeat cell-free  fetal DNA screening in 2 weeks to increase the likelihood of  obtaining results.  Patient will discuss with you and communicate her  decision.  Discussed with Dr. Garwin Brothers.  Ulcerative colitis and primary sclerosing cholangitis (PSC)  Patient does not have any flares of ulcerative colitis.  We  expect good pregnancy outcomes.  Her recent liver enzymes  are within normal range.  Patient will be seeing a liver  specialist soon.  Chronic hypertension  I discussed the possible adverse outcomes of uncontrolled  severe chronic hypertension including maternal complications  (stroke), fetal growth restriction, endorgan damage and  placental abruption.  Patient is taking antihypertensive medications and her blood  pressures are within normal range.  I discussed ultrasound  protocol that includes serial fetal growth assessments, and  weekly antenatal testing from [redacted] weeks gestation.  Previous classical cesarean delivery  I reassured the patient of posterior placenta and that there is  no evidence of placental previa or placenta accreta spectrum.  I recommend delivery at [redacted] weeks gestation.  Increased anti-SSA (Ro) antibodies  I discussed the significance of Ro antibodies that it can be  associated with congenital heart block in 1% to 2% of infants.  I recommended fetal echocardiography starting at [redacted] weeks  gestation.  We have requested an appointment for fetal  echocardiography. ---------------------------------------------------------------------- Recommendations  -An appointment was made for her to return in 7 weeks for  detailed fetal anatomical survey and transvaginal ultrasound  to evaluate the cervix.  -Fetal growth assessment every 4 weeks.  -Weekly BPP from [redacted] weeks gestation till delivery.  -Repeat cesarean delivery at 37 weeks' gestation.  -Prophylactic cerclage in 1 to 2 weeks.  -Option of cell free fetal DNA screening was discussed.  -If repeat cell-free fetal Linea screening showed abnormal or  not reportable result, I recommend genetic counseling to  discuss invasive testing.  -We have requested an appointment for fetal  echocardiography.   -Consultation with liver specialist.  -Continue antihypertensives.  -Initiate low-dose aspirin for preeclampsia prophylaxis in 1 to  2 weeks. ----------------------------------------------------------------------                 Tama High, MD Electronically Signed Final Report   12/23/2021 10:59 am ----------------------------------------------------------------------   Assessment/Plan: Cervical incompetence IUP @ 13 1/2 wk Previous PTB Previous Cesarean section P) Cervical cerclage. Procedure explained. Risk of surgery reviewed including infection, bleeding, injury to surrounding organ structures. All ? answered  Peniel Biel A Marae Cottrell 12/28/2021, 3:20 AM

## 2021-12-28 NOTE — Transfer of Care (Signed)
Immediate Anesthesia Transfer of Care Note  Patient: Jamie Duarte  Procedure(s) Performed: CERCLAGE CERVICAL  Patient Location: PACU  Anesthesia Type:Spinal  Level of Consciousness: awake, alert , and oriented  Airway & Oxygen Therapy: Patient Spontanous Breathing and Patient connected to nasal cannula oxygen  Post-op Assessment: Report given to RN and Post -op Vital signs reviewed and stable  Post vital signs: Reviewed and stable  Last Vitals:  Vitals Value Taken Time  BP 114/61 12/28/21 1245  Temp    Pulse    Resp 20 12/28/21 1246  SpO2    Vitals shown include unvalidated device data.  Last Pain:  Vitals:   12/28/21 0805  TempSrc: Oral         Complications: No notable events documented.

## 2021-12-28 NOTE — Op Note (Unsigned)
[name], Jamie Duarte MEDICAL RECORD NO: [id number] ACCOUNT NO: [id number] DATE OF BIRTH: 04/16/1980 FACILITY: [facility] LOCATION: [facility] PHYSICIAN: Theotis Gerdeman A. Garwin Brothers, MD  Operative Report   DATE OF PROCEDURE: 12/28/2021  PREOPERATIVE DIAGNOSES:  Cervical incompetence, intrauterine gestation at 58 and 3/7 weeks.  PROCEDURE:  McDonald cervical cerclage.  POSTOPERATIVE DIAGNOSES:  Cervical incompetence, intrauterine gestation at 82 and 3/7 weeks.  ANESTHESIA:  Spinal.  SURGEON:  Estefana Taylor A. Garwin Brothers, MD  ASSISTANT:  None.  DESCRIPTION OF PROCEDURE:  Under adequate spinal anesthesia, the patient was placed in the dorsal lithotomy position.  She was sterilely prepped and draped in the usual fashion.  Indwelling Foley catheter was sterilely placed.  The patient was sterilely  prepped and draped in the usual fashion.  Weighted speculum was placed in the vagina.  Sims retractor was placed anteriorly.  The vagina was irrigated.  Cervix was noted.  We closed, clamp was placed on the anterior, posterior lip of the cervix and #1  Prolene stitch was started from the 12 o'clock position circumferentially until the cerclage was fully placed.  An Ethibond suture was then used to tag the stitch and the knot was tied at the 12 o'clock position.  Cervix remained closed.  The procedure  was terminated by removing all instruments from the vagina.  SPECIMEN:  None.  ESTIMATED BLOOD LOSS:  Less than 5 mL.   COMPLICATIONS:  None.   The patient tolerated the procedure well and was transferred to recovery room in stable condition.     PAA D: 12/28/2021 9:37:19 pm T: 12/28/2021 9:58:00 pm  JOB: [id number]/ [id number]

## 2021-12-28 NOTE — Interval H&P Note (Signed)
History and Physical Interval Note:  12/28/2021 11:40 AM  Jamie Duarte  has presented today for surgery, with the diagnosis of Rescue Cerclage 13 weeks.  The various methods of treatment have been discussed with the patient and family. After consideration of risks, benefits and other options for treatment, the patient has consented to  Procedure(s): CERCLAGE CERVICAL (N/A) as a surgical intervention.  The patient's history has been reviewed, patient examined, no change in status, stable for surgery.  I have reviewed the patient's chart and labs.  Questions were answered to the patient's satisfaction.     Kmya Placide A Ranesha Val

## 2021-12-28 NOTE — Discharge Instructions (Signed)
Nothing per vagina x 2 wk, call if soaking a pad every hour or more frequently, severe abdominal pain

## 2021-12-28 NOTE — OB Triage Note (Signed)
Wyvonnia Lora RN

## 2021-12-28 NOTE — Anesthesia Preprocedure Evaluation (Addendum)
Anesthesia Evaluation  Patient identified by MRN, date of birth, ID band Patient awake    Reviewed: Allergy & Precautions, NPO status , Patient's Chart, lab work & pertinent test results, reviewed documented beta blocker date and time   Airway Mallampati: I  TM Distance: >3 FB Neck ROM: Full    Dental  (+) Teeth Intact, Dental Advisory Given   Pulmonary neg pulmonary ROS   Pulmonary exam normal breath sounds clear to auscultation       Cardiovascular hypertension, Pt. on medications and Pt. on home beta blockers Normal cardiovascular exam Rhythm:Regular Rate:Normal     Neuro/Psych negative neurological ROS  negative psych ROS   GI/Hepatic Neg liver ROS, PUD,,,  Endo/Other  negative endocrine ROS    Renal/GU negative Renal ROS  negative genitourinary   Musculoskeletal  (+) Arthritis , Osteoarthritis,    Abdominal   Peds  Hematology negative hematology ROS (+)   Anesthesia Other Findings   Reproductive/Obstetrics (+) Pregnancy Hx 1 prior section and 1 prior cerclage 13 weeks currently                              Anesthesia Physical Anesthesia Plan  ASA: 3  Anesthesia Plan: Spinal   Post-op Pain Management: Ofirmev IV (intra-op)*   Induction:   PONV Risk Score and Plan: 3 and Ondansetron, Dexamethasone and Treatment may vary due to age or medical condition  Airway Management Planned: Natural Airway and Nasal Cannula  Additional Equipment: None  Intra-op Plan:   Post-operative Plan:   Informed Consent: I have reviewed the patients History and Physical, chart, labs and discussed the procedure including the risks, benefits and alternatives for the proposed anesthesia with the patient or authorized representative who has indicated his/her understanding and acceptance.       Plan Discussed with: CRNA  Anesthesia Plan Comments:        Anesthesia Quick Evaluation

## 2021-12-28 NOTE — Progress Notes (Signed)
Dr. Garwin Brothers notified there may be a delay in start time due to staffing and pt care on the unit.

## 2021-12-28 NOTE — Brief Op Note (Signed)
12/28/2021  12:38 PM  PATIENT:  Jamie Duarte  41 y.o. female  PRE-OPERATIVE DIAGNOSIS:  cervical incompetence, IUP @ 13 3/7 wk  POST-OPERATIVE DIAGNOSIS:  cervical incompetence, IUP @ 13 3/7 wk  PROCEDURE:  McDonald cervical cerclage  SURGEON:  Surgeon(s) and Role:    * Servando Salina, MD - Primary  PHYSICIAN ASSISTANT:   ASSISTANTS: none   ANESTHESIA:   spinal  EBL:  5 ml   BLOOD ADMINISTERED:none  DRAINS: none   LOCAL MEDICATIONS USED:  NONE  SPECIMEN:  No Specimen  DISPOSITION OF SPECIMEN:  N/A  COUNTS:  YES  TOURNIQUET:  * No tourniquets in log *  DICTATION: .Other Dictation: Dictation Number [id number]  PLAN OF CARE: Discharge to home after PACU  PATIENT DISPOSITION:  PACU - hemodynamically stable.   Delay start of Pharmacological VTE agent (>24hrs) due to surgical blood loss or risk of bleeding: no

## 2021-12-29 ENCOUNTER — Encounter (HOSPITAL_COMMUNITY): Payer: Self-pay | Admitting: Obstetrics and Gynecology

## 2021-12-30 ENCOUNTER — Other Ambulatory Visit (HOSPITAL_COMMUNITY): Payer: Self-pay

## 2022-01-13 ENCOUNTER — Other Ambulatory Visit (HOSPITAL_COMMUNITY): Payer: Self-pay

## 2022-01-16 ENCOUNTER — Other Ambulatory Visit (HOSPITAL_COMMUNITY): Payer: Self-pay

## 2022-01-17 ENCOUNTER — Other Ambulatory Visit (HOSPITAL_COMMUNITY): Payer: Self-pay

## 2022-01-27 ENCOUNTER — Encounter: Payer: Self-pay | Admitting: Hematology and Oncology

## 2022-02-01 ENCOUNTER — Encounter: Payer: Self-pay | Admitting: Hematology and Oncology

## 2022-02-12 ENCOUNTER — Other Ambulatory Visit: Payer: Self-pay | Admitting: *Deleted

## 2022-02-12 ENCOUNTER — Ambulatory Visit: Payer: No Typology Code available for payment source | Admitting: *Deleted

## 2022-02-12 ENCOUNTER — Ambulatory Visit: Payer: No Typology Code available for payment source | Attending: Obstetrics and Gynecology

## 2022-02-12 VITALS — BP 116/65 | HR 67

## 2022-02-12 DIAGNOSIS — O3412 Maternal care for benign tumor of corpus uteri, second trimester: Secondary | ICD-10-CM | POA: Diagnosis not present

## 2022-02-12 DIAGNOSIS — Z362 Encounter for other antenatal screening follow-up: Secondary | ICD-10-CM

## 2022-02-12 DIAGNOSIS — O09899 Supervision of other high risk pregnancies, unspecified trimester: Secondary | ICD-10-CM | POA: Diagnosis present

## 2022-02-12 DIAGNOSIS — K51919 Ulcerative colitis, unspecified with unspecified complications: Secondary | ICD-10-CM | POA: Insufficient documentation

## 2022-02-12 DIAGNOSIS — O09522 Supervision of elderly multigravida, second trimester: Secondary | ICD-10-CM | POA: Diagnosis present

## 2022-02-12 DIAGNOSIS — N883 Incompetence of cervix uteri: Secondary | ICD-10-CM

## 2022-02-12 DIAGNOSIS — O09212 Supervision of pregnancy with history of pre-term labor, second trimester: Secondary | ICD-10-CM

## 2022-02-12 DIAGNOSIS — O10911 Unspecified pre-existing hypertension complicating pregnancy, first trimester: Secondary | ICD-10-CM | POA: Diagnosis not present

## 2022-02-12 DIAGNOSIS — D259 Leiomyoma of uterus, unspecified: Secondary | ICD-10-CM

## 2022-02-12 DIAGNOSIS — O3432 Maternal care for cervical incompetence, second trimester: Secondary | ICD-10-CM | POA: Diagnosis not present

## 2022-02-12 DIAGNOSIS — O09521 Supervision of elderly multigravida, first trimester: Secondary | ICD-10-CM | POA: Diagnosis present

## 2022-02-12 DIAGNOSIS — Z3A20 20 weeks gestation of pregnancy: Secondary | ICD-10-CM

## 2022-02-12 DIAGNOSIS — O10012 Pre-existing essential hypertension complicating pregnancy, second trimester: Secondary | ICD-10-CM

## 2022-02-12 DIAGNOSIS — O10912 Unspecified pre-existing hypertension complicating pregnancy, second trimester: Secondary | ICD-10-CM

## 2022-02-14 ENCOUNTER — Encounter (HOSPITAL_COMMUNITY): Payer: Self-pay | Admitting: Obstetrics and Gynecology

## 2022-02-14 ENCOUNTER — Ambulatory Visit: Payer: No Typology Code available for payment source | Attending: Cardiology | Admitting: Cardiology

## 2022-02-14 ENCOUNTER — Other Ambulatory Visit (HOSPITAL_COMMUNITY): Payer: Self-pay

## 2022-02-14 ENCOUNTER — Encounter: Payer: Self-pay | Admitting: Cardiology

## 2022-02-14 ENCOUNTER — Inpatient Hospital Stay (HOSPITAL_COMMUNITY)
Admission: AD | Admit: 2022-02-14 | Discharge: 2022-02-14 | Disposition: A | Discharge status: Home / Self Care | Payer: No Typology Code available for payment source | Attending: Obstetrics and Gynecology | Admitting: Obstetrics and Gynecology

## 2022-02-14 ENCOUNTER — Other Ambulatory Visit: Payer: Self-pay

## 2022-02-14 VITALS — BP 132/82 | HR 92 | Ht 67.0 in | Wt 180.0 lb

## 2022-02-14 DIAGNOSIS — O09522 Supervision of elderly multigravida, second trimester: Secondary | ICD-10-CM

## 2022-02-14 DIAGNOSIS — O10912 Unspecified pre-existing hypertension complicating pregnancy, second trimester: Secondary | ICD-10-CM | POA: Insufficient documentation

## 2022-02-14 DIAGNOSIS — R350 Frequency of micturition: Secondary | ICD-10-CM

## 2022-02-14 DIAGNOSIS — Z3A19 19 weeks gestation of pregnancy: Secondary | ICD-10-CM

## 2022-02-14 DIAGNOSIS — O09212 Supervision of pregnancy with history of pre-term labor, second trimester: Secondary | ICD-10-CM | POA: Diagnosis not present

## 2022-02-14 DIAGNOSIS — O26892 Other specified pregnancy related conditions, second trimester: Secondary | ICD-10-CM | POA: Diagnosis present

## 2022-02-14 DIAGNOSIS — Z79899 Other long term (current) drug therapy: Secondary | ICD-10-CM | POA: Diagnosis not present

## 2022-02-14 DIAGNOSIS — O10012 Pre-existing essential hypertension complicating pregnancy, second trimester: Secondary | ICD-10-CM

## 2022-02-14 DIAGNOSIS — O3432 Maternal care for cervical incompetence, second trimester: Secondary | ICD-10-CM

## 2022-02-14 DIAGNOSIS — O26899 Other specified pregnancy related conditions, unspecified trimester: Secondary | ICD-10-CM

## 2022-02-14 DIAGNOSIS — Z3A20 20 weeks gestation of pregnancy: Secondary | ICD-10-CM | POA: Insufficient documentation

## 2022-02-14 DIAGNOSIS — O0992 Supervision of high risk pregnancy, unspecified, second trimester: Secondary | ICD-10-CM

## 2022-02-14 DIAGNOSIS — I10 Essential (primary) hypertension: Secondary | ICD-10-CM

## 2022-02-14 DIAGNOSIS — R1030 Lower abdominal pain, unspecified: Secondary | ICD-10-CM | POA: Diagnosis not present

## 2022-02-14 LAB — COMPREHENSIVE METABOLIC PANEL
ALT: 18 IU/L (ref 0–32)
AST: 17 IU/L (ref 0–40)
Albumin/Globulin Ratio: 1.1 — ABNORMAL LOW (ref 1.2–2.2)
Albumin: 3.5 g/dL — ABNORMAL LOW (ref 3.9–4.9)
Alkaline Phosphatase: 301 IU/L — ABNORMAL HIGH (ref 44–121)
BUN/Creatinine Ratio: 12 (ref 9–23)
BUN: 8 mg/dL (ref 6–24)
Bilirubin Total: 0.5 mg/dL (ref 0.0–1.2)
CO2: 22 mmol/L (ref 20–29)
Calcium: 9.1 mg/dL (ref 8.7–10.2)
Chloride: 102 mmol/L (ref 96–106)
Creatinine, Ser: 0.65 mg/dL (ref 0.57–1.00)
Globulin, Total: 3.2 g/dL (ref 1.5–4.5)
Glucose: 78 mg/dL (ref 70–99)
Potassium: 3.7 mmol/L (ref 3.5–5.2)
Sodium: 134 mmol/L (ref 134–144)
Total Protein: 6.7 g/dL (ref 6.0–8.5)
eGFR: 112 mL/min/{1.73_m2} (ref >59)

## 2022-02-14 LAB — URINALYSIS, ROUTINE W REFLEX MICROSCOPIC
Bilirubin Urine: NEGATIVE
Glucose, UA: NEGATIVE mg/dL
Hgb urine dipstick: NEGATIVE
Ketones, ur: NEGATIVE mg/dL
Leukocytes,Ua: NEGATIVE
Nitrite: NEGATIVE
Protein, ur: NEGATIVE mg/dL
Specific Gravity, Urine: 1.008 (ref 1.005–1.030)
pH: 6 (ref 5.0–8.0)

## 2022-02-14 LAB — MAGNESIUM: Magnesium: 2 mg/dL (ref 1.6–2.3)

## 2022-02-14 MED ORDER — NITROFURANTOIN MONOHYD MACRO 100 MG PO CAPS
100.0000 mg | ORAL_CAPSULE | Freq: Two times a day (BID) | ORAL | Status: DC
  Administered 2022-02-14: 100 mg via ORAL
  Filled 2022-02-14: qty 1

## 2022-02-14 MED ORDER — INDOMETHACIN 25 MG PO CAPS
25.0000 mg | ORAL_CAPSULE | Freq: Four times a day (QID) | ORAL | 0 refills | Status: AC
  Filled 2022-02-14 (×2): qty 7, 2d supply, fill #0

## 2022-02-14 MED ORDER — NITROFURANTOIN MONOHYD MACRO 100 MG PO CAPS
100.0000 mg | ORAL_CAPSULE | Freq: Two times a day (BID) | ORAL | 0 refills | Status: AC
  Filled 2022-02-14 (×2): qty 14, 7d supply, fill #0

## 2022-02-14 MED ORDER — INDOMETHACIN 50 MG RE SUPP
50.0000 mg | Freq: Once | RECTAL | Status: AC
  Administered 2022-02-14: 50 mg via RECTAL
  Filled 2022-02-14: qty 1

## 2022-02-14 NOTE — Progress Notes (Signed)
Cardio-Obstetrics Clinic  Follow Up Note   Date:  02/14/2022   ID:  Jamie Duarte, DOB Jul 01, 1980, MRN [id number]  PCP:  Jamie Arabian, MD   [facility] Providers Cardiologist:  Jamie Salines, DO  Electrophysiologist:  None        Referring MD: Jamie Arabian, MD   Chief Complaint: " I am doing ok"  History of Present Illness:    Jamie Duarte is a 41 y.o. female [[id number]] who returns for follow up of hypertension.   Medical history includes hypertension which was diagnosed about 6 to 7 years ago currently on amlodipine 5 mg daily, HCTZ 25, metoprolol succinate 100 mg daily.  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  She is concerned that she may have lost this pregnancy at 6 weeks because she has been experiencing some bleeding and clotting.  I saw the patient on May 09, 2021 at that time she was [redacted] weeks pregnant.  She was on amlodipine we increase that to 10 mg daily.  We stopped the hydrochlorothiazide and continue to metoprolol succinate.  We discussed that if her blood pressure is not appropriate to target we would have added hydralazine.  I saw the patient 09/25/2021 at time she had lost the baby.    She was seen on 11/19/2021 at that time I continued her Amlodipine. She was doing well until yesterday she had to go to the ED for cramping.   She had been monitoring her blood pressure at home. No chest pain or shortness of breath. The cramping has improved- she is not experiencing cramping in the office today.   Prior CV Studies Reviewed: The following studies were reviewed today: None today  Past Medical History:  Diagnosis Date   Family history of adverse reaction to anesthesia    mom hard to wake up    Herpes    Hypertension    PSC (primary sclerosing cholangitis)    Ulcerative colitis ([place])    Vaginal Pap smear, abnormal     Past Surgical History:  Procedure Laterality Date   BREAST LUMPECTOMY Left 1998   CERCLAGE REMOVAL     CERVICAL CERCLAGE N/A 12/28/2021   Procedure: CERCLAGE CERVICAL;  Surgeon: Servando Salina, MD;  Location: [facility] LD ORS;  Service: Gynecology;  Laterality: N/A;   CESAREAN SECTION     LEEP        OB History     Gravida  4   Para  2   Term      Preterm  2   AB  1   Living  0      SAB  1   IAB      Ectopic      Multiple      Live Births  1               Current Medications: Current Meds  Medication Sig   amLODipine (NORVASC) 10 MG tablet Take 1 tablet (10 mg total) by mouth daily.   aspirin 81 MG chewable tablet Chew 81 mg by mouth daily.   cholestyramine (QUESTRAN) 4 g packet Take by mouth as directed twice daily. (Patient taking differently: Take 4 g by mouth 2 (two) times daily.)   folic acid (FOLVITE) 1 MG tablet Take 1 tablet  by mouth every day .   indomethacin (INDOCIN) 25 MG capsule Take 1 capsule (25 mg total) by mouth every 6 (six) hours for 7 doses.   mesalamine (LIALDA) 1.2 g EC tablet Take 2 tablets (2.4 g total) by mouth twice daily   metoprolol succinate (TOPROL-XL) 100 MG 24 hr tablet Take 1 tablet (100 mg total) by mouth daily for blood pressure.   nitrofurantoin, macrocrystal-monohydrate, (MACROBID) 100 MG capsule Take 1 capsule (100 mg total) by mouth every 12 (twelve) hours for 7 days.   Omega-3 Fatty Acids (FISH OIL) 1000 MG CAPS Take 1,000 mg by mouth daily.   Prenatal Vit-Fe Fumarate-FA (PRENATAL PO) Take 1 tablet by mouth daily.   ursodiol (ACTIGALL) 300 MG capsule Take 2 capsules by mouth 2 times a day.     Allergies:   Patient has no known allergies.   Social History   Socioeconomic History   Marital status: Married    Spouse name: Not on file   Number of children: Not on file   Years of  education: Not on file   Highest education level: Not on file  Occupational History   Not on file  Tobacco Use   Smoking status: Never   Smokeless tobacco: Never  Vaping Use   Vaping Use: Never used  Substance and Sexual Activity   Alcohol use: Never   Drug use: Never   Sexual activity: Yes  Other Topics Concern   Not on file  Social History Narrative   Not on file   Social Determinants of Health   Financial Resource Strain: Not on file  Food Insecurity: No Food Insecurity (05/09/2021)   Hunger Vital Sign    Worried About Running Out of Food in the Last Year: Never true    Ran Out of Food in the Last Year: Never true  Transportation Needs: Unmet Transportation Needs (05/09/2021)   PRAPARE - Hydrologist (Medical): Yes    Lack of Transportation (Non-Medical): Yes  Physical Activity: Not on file  Stress: Not on file  Social Connections: Not on file      Family History  Problem Relation Age of Onset   Healthy Mother    Cancer Mother        breast    Healthy Father    Healthy Brother    Allergic rhinitis Neg Hx    Angioedema Neg Hx    Asthma Neg Hx    Atopy Neg Hx    Eczema Neg Hx    Urticaria Neg Hx    Immunodeficiency Neg Hx       ROS:   Please see the history of present illness.     All other systems reviewed and are negative.   Labs/EKG Reviewed:    EKG:   EKG is was not  ordered today.   Recent Labs: 12/28/2021: Hemoglobin 11.1; Platelets 386 02/14/2022: ALT 18; BUN 8; Creatinine, Ser 0.65; Magnesium 2.0; Potassium 3.7; Sodium 134   Recent Lipid Panel No results found for: "CHOL", "TRIG", "HDL", "CHOLHDL", "LDLCALC", "LDLDIRECT"  Physical Exam:    VS:  BP 132/82   Pulse 92   Ht 5' 7"  (1.702 m)   Wt 180 lb (81.6 kg)   LMP 09/25/2021   BMI 28.19 kg/m     Wt Readings from Last 3 Encounters:  02/14/22 180 lb (81.6 kg)  02/14/22 181 lb (82.1 kg)  12/28/21 174 lb 11.2 oz (79.2 kg)     GEN:  Well nourished,  well  developed in no acute distress HEENT: Normal NECK: No JVD; No carotid bruits LYMPHATICS: No lymphadenopathy CARDIAC: RRR, no murmurs, rubs, gallops RESPIRATORY:  Clear to auscultation without rales, wheezing or rhonchi  ABDOMEN: Soft, non-tender, non-distended MUSCULOSKELETAL:  No edema; No deformity  SKIN: Warm and dry NEUROLOGIC:  Alert and oriented x 3 PSYCHIATRIC:  Normal affect    Risk Assessment/Risk Calculators:                 ASSESSMENT & PLAN:    Chronic hypertension in pregnancy - her blood pressure  is within normal today though slightly higher that prior.  Continue Amlodipine.  Continue aspirin for preeclampsia prophylaxis  I will see her in  6 weeks  Patient Instructions  Medication Instructions:  The current medical regimen is effective;  continue present plan and medications as directed. Please refer to the Current Medication list given to you today.  *If you need a refill on your cardiac medications before your next appointment, please call your pharmacy*  Lab Work: [facility] AND [place] If you have labs (blood work) drawn today and your tests are completely normal, you will receive your results only by: [place] (if you have MyChart) OR A paper copy in the mail If you have any lab test that is abnormal or we need to change your treatment, we will call you to review the results.  Follow-Up: At [facility], you and your health needs are our priority.  As part of our continuing mission to provide you with exceptional heart care, we have created designated Provider Care Teams.  These Care Teams include your primary Cardiologist (physician) and Advanced Practice Providers (APPs -  Physician Assistants and Nurse Practitioners) who all work together to provide you with the care you need, when you need it.  Your next appointment:   6 week(s) OK TO DOUBLE BOOK  The format for your next appointment:   In Person  Provider:   Berniece Salines, DO     Other Instructions   Important Information About Sugar         Dispo:  No follow-ups on file.   Medication Adjustments/Labs and Tests Ordered: Current medicines are reviewed at length with the patient today.  Concerns regarding medicines are outlined above.  Tests Ordered: Orders Placed This Encounter  Procedures   Comprehensive metabolic panel   Magnesium   Medication Changes: No orders of the defined types were placed in this encounter.

## 2022-02-14 NOTE — MAU Provider Note (Incomplete)
History     Chief Complaint  Patient presents with   Abdominal Pain  41 yo [id number] MBF @ 20 2/[redacted] wk gestation presents with c/o lower abdominal cramping . Just had a BM. (+) urinary frequency w/o dysuria   OB History     Gravida  4   Para  2   Term      Preterm  2   AB  1   Living  0      SAB  1   IAB      Ectopic      Multiple      Live Births  1           Past Medical History:  Diagnosis Date   Family history of adverse reaction to anesthesia    mom hard to wake up   Herpes    Hypertension    PSC (primary sclerosing cholangitis)    Ulcerative colitis ([place])    Vaginal Pap smear, abnormal     Past Surgical History:  Procedure Laterality Date   BREAST LUMPECTOMY Left 1998   CERCLAGE REMOVAL     CERVICAL CERCLAGE N/A 12/28/2021   Procedure: CERCLAGE CERVICAL;  Surgeon: Servando Salina, MD;  Location: [facility];  Service: Gynecology;  Laterality: N/A;   CESAREAN SECTION     LEEP      Family History  Problem Relation Age of Onset   Healthy Mother    Cancer Mother        breast    Healthy Father    Healthy Brother    Allergic rhinitis Neg Hx    Angioedema Neg Hx    Asthma Neg Hx    Atopy Neg Hx    Eczema Neg Hx    Urticaria Neg Hx    Immunodeficiency Neg Hx     Social History   Tobacco Use   Smoking status: Never   Smokeless tobacco: Never  Vaping Use   Vaping Use: Never used  Substance Use Topics   Alcohol use: Never   Drug use: Never    Allergies: No Known Allergies  Medications Prior to Admission  Medication Sig Dispense Refill Last Dose   amLODipine (NORVASC) 10 MG tablet Take 1 tablet (10 mg total) by mouth daily. 90 tablet 3 02/13/2022   aspirin 81 MG chewable tablet Chew 81 mg by mouth daily.      folic acid (FOLVITE) 1 MG tablet Take 1 tablet by mouth every day . 90 tablet 3 02/13/2022   mesalamine (LIALDA) 1.2 g EC tablet Take 2 tablets (2.4 g total) by mouth twice daily 360 tablet 3 02/13/2022   metoprolol  succinate (TOPROL-XL) 100 MG 24 hr tablet Take 1 tablet (100 mg total) by mouth daily for blood pressure. 90 tablet 4 02/13/2022   Omega-3 Fatty Acids (FISH OIL) 1000 MG CAPS Take 1,000 mg by mouth daily.   02/13/2022   Prenatal Vit-Fe Fumarate-FA (PRENATAL PO) Take 1 tablet by mouth daily.   02/13/2022   ursodiol (ACTIGALL) 300 MG capsule Take 2 capsules by mouth 2 times a day. 360 capsule 4 02/13/2022   cholestyramine (QUESTRAN) 4 g packet Take by mouth as directed twice daily. (Patient taking differently: Take 4 g by mouth 2 (two) times daily.) 180 each 3    valACYclovir (VALTREX) 500 MG tablet Take 1 tablet by mouth 2 times a day (Patient not taking: Reported on 02/12/2022) 6 tablet 0      Physical Exam   Blood pressure  137/86, pulse 69, temperature 98.5 F (36.9 C), resp. rate 17, height 5' 7"  (1.702 m), weight 82.1 kg, last menstrual period 09/25/2021, SpO2 100 %.  General appearance: alert, cooperative, and no distress Breasts: Normal to palpation without dominant masses Abdomen:  gravid soft non tender Pelvic: cervix normal in appearance, external genitalia normal, and creamy white scant vagina discharge, cervix digital and visually closed, firm. Cerclage intact @ 12 Gravid  Extremities: no edema  [place] MFM OB Transvaginal  Result Date: 02/12/2022 ----------------------------------------------------------------------  OBSTETRICS REPORT                       (Signed Final 02/12/2022 04:39 pm) ---------------------------------------------------------------------- Patient Info  ID #:       [id number]                          D.O.B.:  31-Mar-1980 (41 yrs)  Name:       Jamie Duarte                Visit Date: 02/12/2022 07:26 am              [place] ---------------------------------------------------------------------- Performed By  Attending:        Tama High MD        Ref. Address:     [facility]                                                             OB/GYN                                                              [address]                                                             [place]                                                             [place]  Performed By:     Stephenie Acres        Location:         [facility]                    [facility]                                                             [place] [facility]                                                             [facility]  Referred By:      Alanda Slim                    Loree Shehata MD ---------------------------------------------------------------------- Orders  #  Description                           Code        Ordered By  1  [place] MFM OB TRANSVAGINAL                [phone]     RAVI SHANKAR  2  [place] MFM OB DETAIL +14 [place]               [address].01    RAVI [facility] ----------------------------------------------------------------------  #  Order #                     Accession #                Episode #  1  [id number]                   [id number]                 [id number]  2  [id number]                   [id number]                 [id number] ---------------------------------------------------------------------- Indications  Cervical cerclage suture present, second       O34.32  trimester (12/28/21)  Advanced maternal age multigravida 43+,        O77.522  second trimester (41 yrs)  Poor obstetric history (prior pre-term labor)  O09.219  Uterine fibroids                               O34.10  Hypertension - Chronic/Pre-existing            O10.019  Cervical incompetence, second trimester        O34.32  [redacted] weeks gestation of pregnancy                Z3A.20  Encounter for antenatal screening for          Z36.3  malformations  Neg [place] 21 ---------------------------------------------------------------------- Fetal Evaluation  Num Of Fetuses:         1  Fetal Heart Rate(bpm):  149  Cardiac Activity:        Observed  Presentation:           Breech  Placenta:               Posterior  P. Cord  Insertion:      Visualized, central  Amniotic Fluid  AFI FV:      Within normal limits                              Largest Pocket(cm)                              5.4 ---------------------------------------------------------------------- Biometry  BPD:      46.7  mm     G. Age:  20w 1d         55  %    CI:        67.09   %    70 - 86                                                          FL/HC:      16.9   %    16.8 - 19.8  HC:      182.6  mm     G. Age:  20w 5d         72  %    HC/AC:      1.17        1.09 - 1.39  AC:      155.8  mm     G. Age:  20w 5d         69  %    FL/BPD:     66.0   %  FL:       30.8  mm     G. Age:  19w 4d         26  %    FL/AC:      19.8   %    20 - 24  CER:      20.8  mm     G. Age:  19w 6d         68  %  LV:        6.6  mm  CM:        5.5  mm  Est. FW:     341  gm    0 lb 12 oz      59  % ---------------------------------------------------------------------- OB History  Gravidity:    4         Term:   0        Prem:   2        SAB:   1  TOP:          0       Ectopic:  0        Living: 0 ---------------------------------------------------------------------- Gestational Age  LMP:           20w 0d        Date:  09/25/21                   EDD:   07/02/22  U/S Today:     20w 2d  EDD:   06/30/22  Best:          Hyacinth Meeker 0d     Det. By:  LMP  (09/25/21)          EDD:   07/02/22 ---------------------------------------------------------------------- Anatomy  Cranium:               Appears normal         Aortic Arch:            Appears normal  Cavum:                 Appears normal         Ductal Arch:            Not well visualized  Ventricles:            Appears normal         Diaphragm:              Appears normal  Choroid Plexus:        Appears normal         Stomach:                Appears normal, left                                                                        sided   Cerebellum:            Appears normal         Abdomen:                Appears normal  Posterior Fossa:       Appears normal         Abdominal Wall:         Appears nml (cord                                                                        insert, abd wall)  Nuchal Fold:           Not applicable (>03    Cord Vessels:           Appears normal ([redacted]                         wks GA)                                        vessel cord)  Face:                  Appears normal         Kidneys:                Appear normal                         (orbits and profile)  Lips:                  Appears normal         Bladder:                Appears normal  Thoracic:              Appears normal         Spine:                  Appears normal  Heart:                 Not well visualized    Upper Extremities:      Appears normal  RVOT:                  Not well visualized    Lower Extremities:      Appears normal  LVOT:                  Appears normal  Other:  VC, 3VT, Nasal bone, lenses and falx visualized. feet and LT open          hands. Fetus appears to be female. Technically difficult due to fetal          position. ---------------------------------------------------------------------- Cervix Uterus Adnexa  Cervix  Length:            4.3  cm.  Normal appearance by transvaginal scan  Uterus  Multiple fibroids noted, see table below.  Right Ovary  Size(cm)     2.85   x   2.14   x  1.49      Vol(ml): 4.76  Within normal limits.  Left Ovary  Size(cm)     3.05   x   2.86   x  1.54      Vol(ml): 7.03  Within normal limits.  Cul De Sac  No free fluid seen.  Adnexa  No abnormality visualized. ---------------------------------------------------------------------- Myomas  Site                     L(cm)      W(cm)      D(cm)       Location  Anterior Left            4.1        3.1        4.1  Ant                      2.4        2          2.9 ----------------------------------------------------------------------  Blood Flow                   RI       PI       Comments ---------------------------------------------------------------------- Impression  Patient returned for fetal anatomy scan.  She had first  trimester ultrasound and consultation at our office at [redacted]  weeks gestation.  -Advanced maternal age.  On cell-free fetal DNA screening,  the risks of fetal aneuploidies are not increased.  Primary sclerosing cholangitis and ulcerative colitis.  Patient  takes Actigall and mesalamine.  -Chronic hypertension.  Patient takes metoprolol succinate  XL 100 mg and amlodipine 10 mg daily.  Blood pressure  today at her office is 116/65 mmHg.  Patient has a cardiology  appointment next week.  -Increased anti-SSA (Ro) antibodies.  Patient has an  appointment for fetal echocardiography.  -History of midtrimester pregnancy losses.  On 12/28/2021,  she had prophylactic cerclage.  He does not have symptoms  of uterine contractions or vaginal bleeding.  -Previous classical cesarean delivery.  We performed a fetal anatomy scan. No markers of  aneuploidies or fetal structural defects are seen. Fetal  biometry is consistent with her previously-established dates.  Amniotic fluid is normal and good fetal activity is seen. Fetal  heart rate and rhythm are normal.  2 intramural myomas are seen (measurements above).  We performed transvaginal ultrasound to evaluate the cervix.  The internal os could not be clearly visualized.  The cervix  appears 4 cm long and the cerclage is in place.  Patient understands the limitations of ultrasound in detecting  fetal anomalies. ---------------------------------------------------------------------- Recommendations  -An appointment was made for her to return in 4 weeks for  completion of fetal anatomy.  -Transvaginal ultrasound if cervical shortening is seen. ----------------------------------------------------------------------                  Tama High, MD Electronically Signed Final Report   02/12/2022 04:39 pm  ----------------------------------------------------------------------  [place] MFM OB DETAIL +14 [place]  Result Date: 02/12/2022 ----------------------------------------------------------------------  OBSTETRICS REPORT                       (Signed Final 02/12/2022 04:39 pm) ---------------------------------------------------------------------- Patient Info  ID #:       [id number]                          D.O.B.:  01/31/1981 (41 yrs)  Name:       Jamie Duarte                Visit Date: 02/12/2022 07:26 am              [place] ---------------------------------------------------------------------- Performed By  Attending:        Tama High MD        Ref. Address:     [facility]                                                             OB/GYN                                                             [address]  [place]                                                             [address]  Performed By:     Stephenie Acres        Location:         [facility]                    [facility]                                  [facility] at                                                             [place] for                                                             Women  Referred By:      Alanda Slim                    Kenasia Scheller MD ---------------------------------------------------------------------- Orders  #  Description                           Code        Ordered By  1  [place] MFM OB TRANSVAGINAL                [phone]     RAVI SHANKAR  2  [place] MFM OB DETAIL +14 [place]               [address].01    RAVI [facility] ----------------------------------------------------------------------  #  Order #                     Accession #                Episode #  1  [id number]                   [id number]                 [id number]  2  [id number]                   [id number]                  [id number] ---------------------------------------------------------------------- Indications  Cervical cerclage suture present, second       O34.32  trimester (12/28/21)  Advanced maternal age multigravida 38+,        O70.522  second trimester (89 yrs)  Poor obstetric history (prior pre-term labor)  O09.219  Uterine fibroids  O34.10  Hypertension - Chronic/Pre-existing            O10.019  Cervical incompetence, second trimester        O34.32  [redacted] weeks gestation of pregnancy                Z3A.20  Encounter for antenatal screening for          Z36.3  malformations  Neg [place] 21 ---------------------------------------------------------------------- Fetal Evaluation  Num Of Fetuses:         1  Fetal Heart Rate(bpm):  149  Cardiac Activity:       Observed  Presentation:           Breech  Placenta:               Posterior  P. Cord Insertion:      Visualized, central  Amniotic Fluid  AFI FV:      Within normal limits                              Largest Pocket(cm)                              5.4 ---------------------------------------------------------------------- Biometry  BPD:      46.7  mm     G. Age:  20w 1d         55  %    CI:        67.09   %    70 - 86                                                          FL/HC:      16.9   %    16.8 - 19.8  HC:      182.6  mm     G. Age:  20w 5d         72  %    HC/AC:      1.17        1.09 - 1.39  AC:      155.8  mm     G. Age:  20w 5d         69  %    FL/BPD:     66.0   %  FL:       30.8  mm     G. Age:  19w 4d         26  %    FL/AC:      19.8   %    20 - 24  CER:      20.8  mm     G. Age:  19w 6d         68  %  LV:        6.6  mm  CM:        5.5  mm  Est. FW:     341  gm    0 lb 12 oz      59  % ---------------------------------------------------------------------- OB History  Gravidity:    4         Term:   0        Prem:  2        SAB:   1  TOP:          0       Ectopic:  0        Living: 0  ---------------------------------------------------------------------- Gestational Age  LMP:           20w 0d        Date:  09/25/21                   EDD:   07/02/22  U/S Today:     20w 2d                                        EDD:   06/30/22  Best:          Hyacinth Meeker 0d     Det. By:  LMP  (09/25/21)          EDD:   07/02/22 ---------------------------------------------------------------------- Anatomy  Cranium:               Appears normal         Aortic Arch:            Appears normal  Cavum:                 Appears normal         Ductal Arch:            Not well visualized  Ventricles:            Appears normal         Diaphragm:              Appears normal  Choroid Plexus:        Appears normal         Stomach:                Appears normal, left                                                                        sided  Cerebellum:            Appears normal         Abdomen:                Appears normal  Posterior Fossa:       Appears normal         Abdominal Wall:         Appears nml (cord                                                                        insert, abd wall)  Nuchal Fold:           Not applicable (>82    Cord Vessels:           Appears normal ([redacted]  wks GA)                                        vessel cord)  Face:                  Appears normal         Kidneys:                Appear normal                         (orbits and profile)  Lips:                  Appears normal         Bladder:                Appears normal  Thoracic:              Appears normal         Spine:                  Appears normal  Heart:                 Not well visualized    Upper Extremities:      Appears normal  RVOT:                  Not well visualized    Lower Extremities:      Appears normal  LVOT:                  Appears normal  Other:  VC, 3VT, Nasal bone, lenses and falx visualized. feet and LT open          hands. Fetus appears to be female. Technically difficult due to fetal           position. ---------------------------------------------------------------------- Cervix Uterus Adnexa  Cervix  Length:            4.3  cm.  Normal appearance by transvaginal scan  Uterus  Multiple fibroids noted, see table below.  Right Ovary  Size(cm)     2.85   x   2.14   x  1.49      Vol(ml): 4.76  Within normal limits.  Left Ovary  Size(cm)     3.05   x   2.86   x  1.54      Vol(ml): 7.03  Within normal limits.  Cul De Sac  No free fluid seen.  Adnexa  No abnormality visualized. ---------------------------------------------------------------------- Myomas  Site                     L(cm)      W(cm)      D(cm)       Location  Anterior Left            4.1        3.1        4.1  Ant                      2.4        2          2.9 ----------------------------------------------------------------------  Blood Flow                  RI  PI       Comments ---------------------------------------------------------------------- Impression  Patient returned for fetal anatomy scan.  She had first  trimester ultrasound and consultation at our office at [redacted]  weeks gestation.  -Advanced maternal age.  On cell-free fetal DNA screening,  the risks of fetal aneuploidies are not increased.  Primary sclerosing cholangitis and ulcerative colitis.  Patient  takes Actigall and mesalamine.  -Chronic hypertension.  Patient takes metoprolol succinate  XL 100 mg and amlodipine 10 mg daily.  Blood pressure  today at her office is 116/65 mmHg.  Patient has a cardiology  appointment next week.  -Increased anti-SSA (Ro) antibodies.  Patient has an  appointment for fetal echocardiography.  -History of midtrimester pregnancy losses.  On 12/28/2021,  she had prophylactic cerclage.  He does not have symptoms  of uterine contractions or vaginal bleeding.  -Previous classical cesarean delivery.  We performed a fetal anatomy scan. No markers of  aneuploidies or fetal structural defects are seen. Fetal  biometry is consistent with her  previously-established dates.  Amniotic fluid is normal and good fetal activity is seen. Fetal  heart rate and rhythm are normal.  2 intramural myomas are seen (measurements above).  We performed transvaginal ultrasound to evaluate the cervix.  The internal os could not be clearly visualized.  The cervix  appears 4 cm long and the cerclage is in place.  Patient understands the limitations of ultrasound in detecting  fetal anomalies. ---------------------------------------------------------------------- Recommendations  -An appointment was made for her to return in 4 weeks for  completion of fetal anatomy.  -Transvaginal ultrasound if cervical shortening is seen. ----------------------------------------------------------------------                  Tama High, MD Electronically Signed Final Report   02/12/2022 04:39 pm ----------------------------------------------------------------------   Results for orders placed or performed during the hospital encounter of 02/14/22 (from the past 24 hour(s))  Urinalysis, Routine w reflex microscopic Urine, Clean Catch     Status: Abnormal   Collection Time: 02/14/22  3:00 AM  Result Value Ref Range   Color, Urine STRAW (A) YELLOW   APPearance CLEAR CLEAR   Specific Gravity, Urine 1.008 1.005 - 1.030   pH 6.0 5.0 - 8.0   Glucose, UA NEGATIVE NEGATIVE mg/dL   Hgb urine dipstick NEGATIVE NEGATIVE   Bilirubin Urine NEGATIVE NEGATIVE   Ketones, ur NEGATIVE NEGATIVE mg/dL   Protein, ur NEGATIVE NEGATIVE mg/dL   Nitrite NEGATIVE NEGATIVE   Leukocytes,Ua NEGATIVE NEGATIVE   Ucx sent  ED Course  Abdominal cramping affecting pregnancy Cervical incompetence  with cerclage Urinary frequency Chronic HTN affecting pregnancy Hx PTB IUP @ 20 2/7 wk PSC P) presumed /possible UTI. Will empiric use of macrobid . Pt  instructed to call here on Sunday for ucx results. Indocin use for 48 hrs total. Keep OB appt 1/2. Continue baby ASA.  D/c home MDM   Marvene Staff, MD 4:42 AM 02/14/2022

## 2022-02-14 NOTE — MAU Note (Addendum)
.  Jamie Duarte is a 41 y.o. at 50w2dhere in MAU reporting abdominal aching and tightening in lower abdomen. Last night had a big BM and pain subsided some but still present lower abd. Cramping pain now. Hx 20 and 24 wk losses and had cerclage at 12 wks with this pregnancy. Denies VB  Onset of complaint: Thurs am Pain score: 4 Vitals:   02/14/22 0246 02/14/22 0248  BP:  132/77  Pulse: 68   Resp: 17   Temp: 98.5 F (36.9 C)   SpO2: 100%      FHT:154 Lab orders placed from triage:

## 2022-02-14 NOTE — MAU Note (Signed)
Pt requested to place her own Indocin. Indocin placed by pt without difficulty.

## 2022-02-14 NOTE — Progress Notes (Signed)
DR Garwin Brothers made aware of pt's admission and status with hx of 2 prior losses at 20 and 24wks with current cerclage. MD will come see pt

## 2022-02-14 NOTE — Patient Instructions (Signed)
Medication Instructions:  The current medical regimen is effective;  continue present plan and medications as directed. Please refer to the Current Medication list given to you today.  *If you need a refill on your cardiac medications before your next appointment, please call your pharmacy*  Lab Work: [facility] AND [place] If you have labs (blood work) drawn today and your tests are completely normal, you will receive your results only by: [place] (if you have MyChart) OR A paper copy in the mail If you have any lab test that is abnormal or we need to change your treatment, we will call you to review the results.  Follow-Up: At [facility] you and your health needs are our priority.  As part of our continuing mission to provide you with exceptional heart care, we have created designated Provider Care Teams.  These Care Teams include your primary Cardiologist (physician) and Advanced Practice Providers (APPs -  Physician Assistants and Nurse Practitioners) who all work together to provide you with the care you need, when you need it.  Your next appointment:   6 week(s) OK TO DOUBLE BOOK  The format for your next appointment:   In Person  Provider:   Berniece Salines, DO    Other Instructions   Important Information About Sugar

## 2022-02-14 NOTE — Progress Notes (Signed)
Written and verbal d/c instructions given and understanding voiced. 

## 2022-02-15 LAB — CULTURE, OB URINE: Culture: NO GROWTH

## 2022-02-16 ENCOUNTER — Other Ambulatory Visit (HOSPITAL_COMMUNITY): Payer: Self-pay

## 2022-02-18 DIAGNOSIS — K5190 Ulcerative colitis, unspecified, without complications: Secondary | ICD-10-CM | POA: Diagnosis not present

## 2022-02-18 DIAGNOSIS — K8301 Primary sclerosing cholangitis: Secondary | ICD-10-CM | POA: Diagnosis not present

## 2022-02-18 DIAGNOSIS — R768 Other specified abnormal immunological findings in serum: Secondary | ICD-10-CM | POA: Diagnosis not present

## 2022-02-18 DIAGNOSIS — O09522 Supervision of elderly multigravida, second trimester: Secondary | ICD-10-CM | POA: Diagnosis not present

## 2022-02-18 DIAGNOSIS — O10919 Unspecified pre-existing hypertension complicating pregnancy, unspecified trimester: Secondary | ICD-10-CM | POA: Diagnosis not present

## 2022-02-18 DIAGNOSIS — O34219 Maternal care for unspecified type scar from previous cesarean delivery: Secondary | ICD-10-CM | POA: Diagnosis not present

## 2022-02-18 DIAGNOSIS — O3430 Maternal care for cervical incompetence, unspecified trimester: Secondary | ICD-10-CM | POA: Diagnosis not present

## 2022-02-18 DIAGNOSIS — O3432 Maternal care for cervical incompetence, second trimester: Secondary | ICD-10-CM | POA: Diagnosis not present

## 2022-02-18 DIAGNOSIS — O09212 Supervision of pregnancy with history of pre-term labor, second trimester: Secondary | ICD-10-CM | POA: Diagnosis not present

## 2022-02-20 ENCOUNTER — Other Ambulatory Visit (HOSPITAL_COMMUNITY): Payer: Self-pay

## 2022-02-20 ENCOUNTER — Encounter (HOSPITAL_COMMUNITY): Payer: Self-pay

## 2022-02-20 ENCOUNTER — Encounter: Payer: Self-pay | Admitting: Hematology and Oncology

## 2022-02-25 DIAGNOSIS — M3500 Sicca syndrome, unspecified: Secondary | ICD-10-CM | POA: Diagnosis not present

## 2022-03-04 ENCOUNTER — Encounter: Payer: Self-pay | Admitting: Pediatric Cardiology

## 2022-03-04 DIAGNOSIS — O09212 Supervision of pregnancy with history of pre-term labor, second trimester: Secondary | ICD-10-CM | POA: Diagnosis not present

## 2022-03-04 DIAGNOSIS — O3432 Maternal care for cervical incompetence, second trimester: Secondary | ICD-10-CM | POA: Diagnosis not present

## 2022-03-04 DIAGNOSIS — R768 Other specified abnormal immunological findings in serum: Secondary | ICD-10-CM | POA: Diagnosis not present

## 2022-03-04 DIAGNOSIS — O34219 Maternal care for unspecified type scar from previous cesarean delivery: Secondary | ICD-10-CM | POA: Diagnosis not present

## 2022-03-04 DIAGNOSIS — O09522 Supervision of elderly multigravida, second trimester: Secondary | ICD-10-CM | POA: Diagnosis not present

## 2022-03-04 DIAGNOSIS — O10919 Unspecified pre-existing hypertension complicating pregnancy, unspecified trimester: Secondary | ICD-10-CM | POA: Diagnosis not present

## 2022-03-04 DIAGNOSIS — O3430 Maternal care for cervical incompetence, unspecified trimester: Secondary | ICD-10-CM | POA: Diagnosis not present

## 2022-03-04 DIAGNOSIS — K8301 Primary sclerosing cholangitis: Secondary | ICD-10-CM | POA: Diagnosis not present

## 2022-03-05 ENCOUNTER — Encounter: Payer: Self-pay | Admitting: Hematology and Oncology

## 2022-03-12 ENCOUNTER — Other Ambulatory Visit: Payer: Self-pay | Admitting: *Deleted

## 2022-03-12 ENCOUNTER — Other Ambulatory Visit: Payer: Self-pay

## 2022-03-12 ENCOUNTER — Ambulatory Visit: Payer: Commercial Managed Care - PPO

## 2022-03-12 ENCOUNTER — Ambulatory Visit: Payer: Commercial Managed Care - PPO | Attending: Obstetrics and Gynecology

## 2022-03-12 ENCOUNTER — Other Ambulatory Visit (HOSPITAL_COMMUNITY): Payer: Self-pay

## 2022-03-12 VITALS — BP 122/76 | HR 64

## 2022-03-12 DIAGNOSIS — Z362 Encounter for other antenatal screening follow-up: Secondary | ICD-10-CM | POA: Insufficient documentation

## 2022-03-12 DIAGNOSIS — D259 Leiomyoma of uterus, unspecified: Secondary | ICD-10-CM

## 2022-03-12 DIAGNOSIS — O10912 Unspecified pre-existing hypertension complicating pregnancy, second trimester: Secondary | ICD-10-CM | POA: Insufficient documentation

## 2022-03-12 DIAGNOSIS — Z3A24 24 weeks gestation of pregnancy: Secondary | ICD-10-CM

## 2022-03-12 DIAGNOSIS — O09212 Supervision of pregnancy with history of pre-term labor, second trimester: Secondary | ICD-10-CM | POA: Diagnosis not present

## 2022-03-12 DIAGNOSIS — O09522 Supervision of elderly multigravida, second trimester: Secondary | ICD-10-CM | POA: Diagnosis not present

## 2022-03-12 DIAGNOSIS — O281 Abnormal biochemical finding on antenatal screening of mother: Secondary | ICD-10-CM | POA: Diagnosis not present

## 2022-03-12 DIAGNOSIS — O3412 Maternal care for benign tumor of corpus uteri, second trimester: Secondary | ICD-10-CM | POA: Diagnosis not present

## 2022-03-12 DIAGNOSIS — O10012 Pre-existing essential hypertension complicating pregnancy, second trimester: Secondary | ICD-10-CM

## 2022-03-12 DIAGNOSIS — O3432 Maternal care for cervical incompetence, second trimester: Secondary | ICD-10-CM

## 2022-03-13 ENCOUNTER — Other Ambulatory Visit (HOSPITAL_COMMUNITY): Payer: Self-pay

## 2022-03-18 DIAGNOSIS — O34219 Maternal care for unspecified type scar from previous cesarean delivery: Secondary | ICD-10-CM | POA: Diagnosis not present

## 2022-03-18 DIAGNOSIS — O09522 Supervision of elderly multigravida, second trimester: Secondary | ICD-10-CM | POA: Diagnosis not present

## 2022-03-18 DIAGNOSIS — O3430 Maternal care for cervical incompetence, unspecified trimester: Secondary | ICD-10-CM | POA: Diagnosis not present

## 2022-03-18 DIAGNOSIS — Z3689 Encounter for other specified antenatal screening: Secondary | ICD-10-CM | POA: Diagnosis not present

## 2022-03-18 DIAGNOSIS — O09212 Supervision of pregnancy with history of pre-term labor, second trimester: Secondary | ICD-10-CM | POA: Diagnosis not present

## 2022-03-18 DIAGNOSIS — O09529 Supervision of elderly multigravida, unspecified trimester: Secondary | ICD-10-CM | POA: Diagnosis not present

## 2022-03-18 DIAGNOSIS — R768 Other specified abnormal immunological findings in serum: Secondary | ICD-10-CM | POA: Diagnosis not present

## 2022-03-18 DIAGNOSIS — K8301 Primary sclerosing cholangitis: Secondary | ICD-10-CM | POA: Diagnosis not present

## 2022-03-18 DIAGNOSIS — O3432 Maternal care for cervical incompetence, second trimester: Secondary | ICD-10-CM | POA: Diagnosis not present

## 2022-03-18 DIAGNOSIS — O10919 Unspecified pre-existing hypertension complicating pregnancy, unspecified trimester: Secondary | ICD-10-CM | POA: Diagnosis not present

## 2022-03-24 ENCOUNTER — Encounter: Payer: Self-pay | Admitting: Cardiology

## 2022-03-31 ENCOUNTER — Encounter: Payer: Self-pay | Admitting: Cardiology

## 2022-03-31 ENCOUNTER — Ambulatory Visit: Payer: Commercial Managed Care - PPO | Attending: Cardiology | Admitting: Cardiology

## 2022-03-31 VITALS — BP 124/80 | HR 70 | Ht 67.0 in | Wt 192.0 lb

## 2022-03-31 DIAGNOSIS — O09522 Supervision of elderly multigravida, second trimester: Secondary | ICD-10-CM | POA: Diagnosis not present

## 2022-03-31 DIAGNOSIS — I10 Essential (primary) hypertension: Secondary | ICD-10-CM

## 2022-03-31 DIAGNOSIS — Z3A27 27 weeks gestation of pregnancy: Secondary | ICD-10-CM

## 2022-03-31 DIAGNOSIS — O0992 Supervision of high risk pregnancy, unspecified, second trimester: Secondary | ICD-10-CM | POA: Diagnosis not present

## 2022-03-31 NOTE — Patient Instructions (Signed)
Medication Instructions:  Your physician recommends that you continue on your current medications as directed. Please refer to the Current Medication list given to you today.  *If you need a refill on your cardiac medications before your next appointment, please call your pharmacy*   Lab Work: NONE ordered at this time of appointment   If you have labs (blood work) drawn today and your tests are completely normal, you will receive your results only by: [place] (if you have MyChart) OR A paper copy in the mail If you have any lab test that is abnormal or we need to change your treatment, we will call you to review the results.   Testing/Procedures: NONE ordered at this time of appointment    Follow-Up: At [facility], you and your health needs are our priority.  As part of our continuing mission to provide you with exceptional heart care, we have created designated Provider Care Teams.  These Care Teams include your primary Cardiologist (physician) and Advanced Practice Providers (APPs -  Physician Assistants and Nurse Practitioners) who all work together to provide you with the care you need, when you need it.   Your next appointment:   8 week(s)  Provider:   Berniece Salines, DO     Other Instructions

## 2022-03-31 NOTE — Progress Notes (Unsigned)
Cardio-Obstetrics Clinic  Follow Up Note   Date:  04/02/2022   ID:  Jamie Duarte, DOB June 09, 1980, MRN [id number]  PCP:  Gaynelle Arabian, MD   [facility] Providers Cardiologist:  Berniece Salines, DO  Electrophysiologist:  None        Referring MD: Gaynelle Arabian, MD   Chief Complaint: " I am doing ok"  History of Present Illness:    Jamie Duarte is a 42 y.o. female [[id number]] who returns for follow up of hypertension.   Medical history includes hypertension which was diagnosed about 6 to 7 years ago currently on amlodipine 5 mg daily, HCTZ 25, metoprolol succinate 100 mg daily.  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  She is concerned that she may have lost this pregnancy at 6 weeks because she has been experiencing some bleeding and clotting.  I saw the patient on May 09, 2021 at that time she was [redacted] weeks pregnant.  She was on amlodipine we increase that to 10 mg daily.  We stopped the hydrochlorothiazide and continue to metoprolol succinate.  We discussed that if her blood pressure is not appropriate to target we would have added hydralazine.  I saw the patient 09/25/2021 at time she had lost the baby.    She was seen on 11/19/2021 at that time I continued her Amlodipine. She was doing well until yesterday she had to go to the ED for cramping.   At her last visit she was experiencing some cramping.  Brought the patient in blood pressure was within normal.  She is here today for her 6-week follow-up.   Prior CV Studies Reviewed: The following studies were reviewed today: None today  Past Medical History:  Diagnosis Date   Family history of adverse reaction to anesthesia    mom hard to wake up   Herpes     Hypertension    PSC (primary sclerosing cholangitis)    Ulcerative colitis ([place])    Vaginal Pap smear, abnormal     Past Surgical History:  Procedure Laterality Date   BREAST LUMPECTOMY Left 1998   CERCLAGE REMOVAL     CERVICAL CERCLAGE N/A 12/28/2021   Procedure: CERCLAGE CERVICAL;  Surgeon: Servando Salina, MD;  Location: [facility] LD [facility]S;  Service: Gynecology;  Laterality: N/A;   CESAREAN SECTION     LEEP        OB History     Gravida  4   Para  2   Term      Preterm  2   AB  1   Living  0      SAB  1   IAB      Ectopic      Multiple      Live Births  1               Current Medications: Current Meds  Medication Sig   amLODipine (NORVASC) 10 MG tablet Take 1 tablet (10 mg total) by mouth daily.   aspirin 81 MG chewable tablet Chew 81 mg by mouth daily.   cholestyramine (QUESTRAN) 4 g packet Take by mouth as directed twice daily. (Patient taking differently: Take 4 g by mouth 2 (two) times daily.)   folic acid (FOLVITE) 1 MG tablet Take 1 tablet by  mouth every day .   mesalamine (LIALDA) 1.2 g EC tablet Take 2 tablets (2.4 g total) by mouth twice daily   metoprolol succinate (TOPROL-XL) 100 MG 24 hr tablet Take 1 tablet (100 mg total) by mouth daily for blood pressure.   Omega-3 Fatty Acids (FISH OIL) 1000 MG CAPS Take 1,000 mg by mouth daily.   Prenatal Vit-Fe Fumarate-FA (PRENATAL PO) Take 1 tablet by mouth daily.   ursodiol (ACTIGALL) 300 MG capsule Take 2 capsules by mouth 2 times a day.     Allergies:   Patient has no known allergies.   Social History   Socioeconomic History   Marital status: Married    Spouse name: Not on file   Number of children: Not on file   Years of education: Not on file   Highest education level: Not on file  Occupational History   Not on file  Tobacco Use   Smoking status: Never   Smokeless tobacco: Never  Vaping Use   Vaping Use: Never used  Substance and Sexual Activity   Alcohol use: Never   Drug use:  Never   Sexual activity: Yes  Other Topics Concern   Not on file  Social History Narrative   Not on file   Social Determinants of Health   Financial Resource Strain: Not on file  Food Insecurity: No Food Insecurity (05/09/2021)   Hunger Vital Sign    Worried About Running Out of Food in the Last Year: Never true    Ran Out of Food in the Last Year: Never true  Transportation Needs: Unmet Transportation Needs (05/09/2021)   PRAPARE - Hydrologist (Medical): Yes    Lack of Transportation (Non-Medical): Yes  Physical Activity: Not on file  Stress: Not on file  Social Connections: Not on file      Family History  Problem Relation Age of Onset   Healthy Mother    Cancer Mother        breast    Healthy Father    Healthy Brother    Allergic rhinitis Neg Hx    Angioedema Neg Hx    Asthma Neg Hx    Atopy Neg Hx    Eczema Neg Hx    Urticaria Neg Hx    Immunodeficiency Neg Hx       ROS:   Please see the history of present illness.     All other systems reviewed and are negative.   Labs/EKG Reviewed:    EKG:   EKG is was not  ordered today.   Recent Labs: 12/28/2021: Hemoglobin 11.1; Platelets 386 02/14/2022: ALT 18; BUN 8; Creatinine, Ser 0.65; Magnesium 2.0; Potassium 3.7; Sodium 134   Recent Lipid Panel No results found for: "CHOL", "TRIG", "HDL", "CHOLHDL", "LDLCALC", "LDLDIRECT"  Physical Exam:    VS:  BP 124/80   Pulse 70   Ht 5' 7"$  (1.702 m)   Wt 87.1 kg   LMP 09/25/2021   SpO2 98%   BMI 30.07 kg/m     Wt Readings from Last 3 Encounters:  03/31/22 87.1 kg  02/14/22 81.6 kg  02/14/22 82.1 kg     GEN:  Well nourished, well developed in no acute distress HEENT: Normal NECK: No JVD; No carotid bruits LYMPHATICS: No lymphadenopathy CARDIAC: RRR, no murmurs, rubs, gallops RESPIRATORY:  Clear to auscultation without rales, wheezing or rhonchi  ABDOMEN: Soft, non-tender, non-distended MUSCULOSKELETAL:  No edema; No  deformity  SKIN: Warm and dry NEUROLOGIC:  Alert and oriented x 3 PSYCHIATRIC:  Normal affect    Risk Assessment/Risk Calculators:    CARPREG II Risk Prediction Index Score:  1.  The patient's risk for a primary cardiac event is 5%.            ASSESSMENT & PLAN:    Chronic hypertension in pregnancy -her blood pressure is within normal limits today.  She did bring a home blood pressure cuff   continue Amlodipine and Toprol-XL.   Continue aspirin for preeclampsia prophylaxis  I will see her in  8 weeks  Patient Instructions  Medication Instructions:  Your physician recommends that you continue on your current medications as directed. Please refer to the Current Medication list given to you today.  *If you need a refill on your cardiac medications before your next appointment, please call your pharmacy*   Lab Work: NONE ordered at this time of appointment   If you have labs (blood work) drawn today and your tests are completely normal, you will receive your results only by: [address] (if you have MyChart) OR A paper copy in the mail If you have any lab test that is abnormal or we need to change your treatment, we will call you to review the results.   Testing/Procedures: NONE ordered at this time of appointment    Follow-Up: At [facility], you and your health needs are our priority.  As part of our continuing mission to provide you with exceptional heart care, we have created designated Provider Care Teams.  These Care Teams include your primary Cardiologist (physician) and Advanced Practice Providers (APPs -  Physician Assistants and Nurse Practitioners) who all work together to provide you with the care you need, when you need it.   Your next appointment:   8 week(s)  Provider:   Berniece Salines, DO     Other Instructions     Dispo:  Return in about 8 weeks (around 05/26/2022).   Medication Adjustments/Labs and Tests Ordered: Current medicines  are reviewed at length with the patient today.  Concerns regarding medicines are outlined above.  Tests Ordered: No orders of the defined types were placed in this encounter.  Medication Changes: No orders of the defined types were placed in this encounter.

## 2022-04-01 DIAGNOSIS — O10919 Unspecified pre-existing hypertension complicating pregnancy, unspecified trimester: Secondary | ICD-10-CM | POA: Diagnosis not present

## 2022-04-01 DIAGNOSIS — R768 Other specified abnormal immunological findings in serum: Secondary | ICD-10-CM | POA: Diagnosis not present

## 2022-04-01 DIAGNOSIS — O34219 Maternal care for unspecified type scar from previous cesarean delivery: Secondary | ICD-10-CM | POA: Diagnosis not present

## 2022-04-01 DIAGNOSIS — O3433 Maternal care for cervical incompetence, third trimester: Secondary | ICD-10-CM | POA: Diagnosis not present

## 2022-04-01 DIAGNOSIS — O09523 Supervision of elderly multigravida, third trimester: Secondary | ICD-10-CM | POA: Diagnosis not present

## 2022-04-01 DIAGNOSIS — O09529 Supervision of elderly multigravida, unspecified trimester: Secondary | ICD-10-CM | POA: Diagnosis not present

## 2022-04-01 DIAGNOSIS — O358XX0 Maternal care for other (suspected) fetal abnormality and damage, not applicable or unspecified: Secondary | ICD-10-CM | POA: Diagnosis not present

## 2022-04-01 DIAGNOSIS — K8301 Primary sclerosing cholangitis: Secondary | ICD-10-CM | POA: Diagnosis not present

## 2022-04-01 DIAGNOSIS — O3430 Maternal care for cervical incompetence, unspecified trimester: Secondary | ICD-10-CM | POA: Diagnosis not present

## 2022-04-09 ENCOUNTER — Ambulatory Visit: Payer: Commercial Managed Care - PPO | Attending: Obstetrics

## 2022-04-09 ENCOUNTER — Ambulatory Visit: Payer: Commercial Managed Care - PPO | Admitting: *Deleted

## 2022-04-09 VITALS — BP 123/73 | HR 65

## 2022-04-09 DIAGNOSIS — Z3A28 28 weeks gestation of pregnancy: Secondary | ICD-10-CM | POA: Diagnosis not present

## 2022-04-09 DIAGNOSIS — O09523 Supervision of elderly multigravida, third trimester: Secondary | ICD-10-CM

## 2022-04-09 DIAGNOSIS — D259 Leiomyoma of uterus, unspecified: Secondary | ICD-10-CM | POA: Diagnosis not present

## 2022-04-09 DIAGNOSIS — O3433 Maternal care for cervical incompetence, third trimester: Secondary | ICD-10-CM

## 2022-04-09 DIAGNOSIS — O361930 Maternal care for other isoimmunization, third trimester, not applicable or unspecified: Secondary | ICD-10-CM | POA: Diagnosis not present

## 2022-04-09 DIAGNOSIS — O10013 Pre-existing essential hypertension complicating pregnancy, third trimester: Secondary | ICD-10-CM | POA: Diagnosis not present

## 2022-04-09 DIAGNOSIS — O3413 Maternal care for benign tumor of corpus uteri, third trimester: Secondary | ICD-10-CM | POA: Diagnosis not present

## 2022-04-09 DIAGNOSIS — O09522 Supervision of elderly multigravida, second trimester: Secondary | ICD-10-CM | POA: Diagnosis not present

## 2022-04-09 DIAGNOSIS — O09213 Supervision of pregnancy with history of pre-term labor, third trimester: Secondary | ICD-10-CM

## 2022-04-09 DIAGNOSIS — O10913 Unspecified pre-existing hypertension complicating pregnancy, third trimester: Secondary | ICD-10-CM | POA: Insufficient documentation

## 2022-04-10 ENCOUNTER — Other Ambulatory Visit: Payer: Self-pay | Admitting: *Deleted

## 2022-04-10 DIAGNOSIS — O3433 Maternal care for cervical incompetence, third trimester: Secondary | ICD-10-CM

## 2022-04-10 DIAGNOSIS — O10913 Unspecified pre-existing hypertension complicating pregnancy, third trimester: Secondary | ICD-10-CM

## 2022-04-10 DIAGNOSIS — O09523 Supervision of elderly multigravida, third trimester: Secondary | ICD-10-CM

## 2022-04-14 ENCOUNTER — Other Ambulatory Visit (HOSPITAL_COMMUNITY): Payer: Self-pay

## 2022-04-14 ENCOUNTER — Other Ambulatory Visit: Payer: Self-pay

## 2022-04-15 ENCOUNTER — Other Ambulatory Visit (HOSPITAL_COMMUNITY): Payer: Self-pay

## 2022-04-15 DIAGNOSIS — O34219 Maternal care for unspecified type scar from previous cesarean delivery: Secondary | ICD-10-CM | POA: Diagnosis not present

## 2022-04-15 DIAGNOSIS — O09523 Supervision of elderly multigravida, third trimester: Secondary | ICD-10-CM | POA: Diagnosis not present

## 2022-04-15 DIAGNOSIS — O09213 Supervision of pregnancy with history of pre-term labor, third trimester: Secondary | ICD-10-CM | POA: Diagnosis not present

## 2022-04-15 DIAGNOSIS — K8301 Primary sclerosing cholangitis: Secondary | ICD-10-CM | POA: Diagnosis not present

## 2022-04-15 DIAGNOSIS — O3433 Maternal care for cervical incompetence, third trimester: Secondary | ICD-10-CM | POA: Diagnosis not present

## 2022-04-15 DIAGNOSIS — O3430 Maternal care for cervical incompetence, unspecified trimester: Secondary | ICD-10-CM | POA: Diagnosis not present

## 2022-04-15 DIAGNOSIS — O10919 Unspecified pre-existing hypertension complicating pregnancy, unspecified trimester: Secondary | ICD-10-CM | POA: Diagnosis not present

## 2022-04-15 DIAGNOSIS — O09529 Supervision of elderly multigravida, unspecified trimester: Secondary | ICD-10-CM | POA: Diagnosis not present

## 2022-04-15 DIAGNOSIS — R768 Other specified abnormal immunological findings in serum: Secondary | ICD-10-CM | POA: Diagnosis not present

## 2022-04-26 ENCOUNTER — Inpatient Hospital Stay (HOSPITAL_BASED_OUTPATIENT_CLINIC_OR_DEPARTMENT_OTHER): Payer: Commercial Managed Care - PPO

## 2022-04-26 ENCOUNTER — Encounter (HOSPITAL_COMMUNITY): Payer: Self-pay | Admitting: Obstetrics and Gynecology

## 2022-04-26 ENCOUNTER — Observation Stay (HOSPITAL_COMMUNITY)
Admission: AD | Admit: 2022-04-26 | Discharge: 2022-04-27 | Disposition: A | Discharge status: Home / Self Care | Payer: Commercial Managed Care - PPO | Attending: Obstetrics and Gynecology | Admitting: Obstetrics and Gynecology

## 2022-04-26 ENCOUNTER — Other Ambulatory Visit: Payer: Self-pay

## 2022-04-26 DIAGNOSIS — Z3A30 30 weeks gestation of pregnancy: Secondary | ICD-10-CM | POA: Diagnosis not present

## 2022-04-26 DIAGNOSIS — O10913 Unspecified pre-existing hypertension complicating pregnancy, third trimester: Secondary | ICD-10-CM | POA: Diagnosis not present

## 2022-04-26 DIAGNOSIS — O10013 Pre-existing essential hypertension complicating pregnancy, third trimester: Secondary | ICD-10-CM

## 2022-04-26 DIAGNOSIS — O34219 Maternal care for unspecified type scar from previous cesarean delivery: Secondary | ICD-10-CM | POA: Diagnosis not present

## 2022-04-26 DIAGNOSIS — O361930 Maternal care for other isoimmunization, third trimester, not applicable or unspecified: Secondary | ICD-10-CM | POA: Diagnosis not present

## 2022-04-26 DIAGNOSIS — O99613 Diseases of the digestive system complicating pregnancy, third trimester: Secondary | ICD-10-CM | POA: Diagnosis present

## 2022-04-26 DIAGNOSIS — K8301 Primary sclerosing cholangitis: Secondary | ICD-10-CM

## 2022-04-26 DIAGNOSIS — O3413 Maternal care for benign tumor of corpus uteri, third trimester: Secondary | ICD-10-CM | POA: Diagnosis not present

## 2022-04-26 DIAGNOSIS — O321XX0 Maternal care for breech presentation, not applicable or unspecified: Secondary | ICD-10-CM | POA: Diagnosis present

## 2022-04-26 DIAGNOSIS — O09523 Supervision of elderly multigravida, third trimester: Secondary | ICD-10-CM

## 2022-04-26 DIAGNOSIS — K5190 Ulcerative colitis, unspecified, without complications: Secondary | ICD-10-CM | POA: Diagnosis not present

## 2022-04-26 DIAGNOSIS — O288 Other abnormal findings on antenatal screening of mother: Secondary | ICD-10-CM

## 2022-04-26 DIAGNOSIS — O163 Unspecified maternal hypertension, third trimester: Secondary | ICD-10-CM | POA: Diagnosis not present

## 2022-04-26 DIAGNOSIS — O26613 Liver and biliary tract disorders in pregnancy, third trimester: Secondary | ICD-10-CM | POA: Diagnosis present

## 2022-04-26 DIAGNOSIS — O09513 Supervision of elderly primigravida, third trimester: Secondary | ICD-10-CM | POA: Diagnosis not present

## 2022-04-26 DIAGNOSIS — O3433 Maternal care for cervical incompetence, third trimester: Secondary | ICD-10-CM | POA: Insufficient documentation

## 2022-04-26 DIAGNOSIS — O09893 Supervision of other high risk pregnancies, third trimester: Secondary | ICD-10-CM

## 2022-04-26 DIAGNOSIS — O09213 Supervision of pregnancy with history of pre-term labor, third trimester: Secondary | ICD-10-CM | POA: Diagnosis not present

## 2022-04-26 DIAGNOSIS — D259 Leiomyoma of uterus, unspecified: Secondary | ICD-10-CM | POA: Diagnosis present

## 2022-04-26 DIAGNOSIS — O4703 False labor before 37 completed weeks of gestation, third trimester: Secondary | ICD-10-CM | POA: Diagnosis present

## 2022-04-26 LAB — CBC WITH DIFFERENTIAL/PLATELET
Abs Immature Granulocytes: 0.07 10*3/uL (ref 0.00–0.07)
Basophils Absolute: 0 10*3/uL (ref 0.0–0.1)
Basophils Relative: 0 %
Eosinophils Absolute: 0 10*3/uL (ref 0.0–0.5)
Eosinophils Relative: 0 %
HCT: 38.8 % (ref 36.0–46.0)
Hemoglobin: 13.6 g/dL (ref 12.0–15.0)
Immature Granulocytes: 1 %
Lymphocytes Relative: 16 %
Lymphs Abs: 1.4 10*3/uL (ref 0.7–4.0)
MCH: 31.7 pg (ref 26.0–34.0)
MCHC: 35.1 g/dL (ref 30.0–36.0)
MCV: 90.4 fL (ref 80.0–100.0)
Monocytes Absolute: 0.8 10*3/uL (ref 0.1–1.0)
Monocytes Relative: 8 %
Neutro Abs: 6.9 10*3/uL (ref 1.7–7.7)
Neutrophils Relative %: 75 %
Platelets: 304 10*3/uL (ref 150–400)
RBC: 4.29 MIL/uL (ref 3.87–5.11)
RDW: 12.4 % (ref 11.5–15.5)
WBC: 9.2 10*3/uL (ref 4.0–10.5)
nRBC: 0 % (ref 0.0–0.2)

## 2022-04-26 LAB — URINALYSIS, ROUTINE W REFLEX MICROSCOPIC
Bilirubin Urine: NEGATIVE
Glucose, UA: NEGATIVE mg/dL
Hgb urine dipstick: NEGATIVE
Ketones, ur: NEGATIVE mg/dL
Leukocytes,Ua: NEGATIVE
Nitrite: NEGATIVE
Protein, ur: 30 mg/dL — AB
Specific Gravity, Urine: 1.008 (ref 1.005–1.030)
pH: 7 (ref 5.0–8.0)

## 2022-04-26 LAB — COMPREHENSIVE METABOLIC PANEL
ALT: 22 U/L (ref 0–44)
AST: 23 U/L (ref 15–41)
Albumin: 2.7 g/dL — ABNORMAL LOW (ref 3.5–5.0)
Alkaline Phosphatase: 279 U/L — ABNORMAL HIGH (ref 38–126)
Anion gap: 10 (ref 5–15)
BUN: 5 mg/dL — ABNORMAL LOW (ref 6–20)
CO2: 22 mmol/L (ref 22–32)
Calcium: 9.2 mg/dL (ref 8.9–10.3)
Chloride: 104 mmol/L (ref 98–111)
Creatinine, Ser: 0.67 mg/dL (ref 0.44–1.00)
GFR, Estimated: >60 mL/min (ref >60)
Glucose, Bld: 73 mg/dL (ref 70–99)
Potassium: 3.5 mmol/L (ref 3.5–5.1)
Sodium: 136 mmol/L (ref 135–145)
Total Bilirubin: 0.9 mg/dL (ref 0.3–1.2)
Total Protein: 7.1 g/dL (ref 6.5–8.1)

## 2022-04-26 LAB — TYPE AND SCREEN
ABO/RH(D): O POS
Antibody Screen: NEGATIVE

## 2022-04-26 MED ORDER — AMLODIPINE BESYLATE 5 MG PO TABS
10.0000 mg | ORAL_TABLET | Freq: Every day | ORAL | Status: DC
  Administered 2022-04-27: 10 mg via ORAL
  Filled 2022-04-26: qty 2

## 2022-04-26 MED ORDER — CHOLESTYRAMINE LIGHT 4 G PO PACK
4.0000 g | PACK | Freq: Two times a day (BID) | ORAL | Status: DC
  Administered 2022-04-26 – 2022-04-27 (×3): 4 g via ORAL
  Filled 2022-04-26 (×4): qty 1

## 2022-04-26 MED ORDER — URSODIOL 300 MG PO CAPS: 300.0000 mg | ORAL_CAPSULE | Freq: Two times a day (BID) | ORAL | Status: DC

## 2022-04-26 MED ORDER — METOPROLOL SUCCINATE ER 100 MG PO TB24
100.0000 mg | ORAL_TABLET | Freq: Every day | ORAL | Status: DC
  Administered 2022-04-27: 100 mg via ORAL
  Filled 2022-04-26: qty 1

## 2022-04-26 MED ORDER — BETAMETHASONE SOD PHOS & ACET 6 (3-3) MG/ML IJ SUSP
12.0000 mg | INTRAMUSCULAR | Status: AC
  Administered 2022-04-26 – 2022-04-27 (×2): 12 mg via INTRAMUSCULAR
  Filled 2022-04-26: qty 5

## 2022-04-26 MED ORDER — CALCIUM CARBONATE ANTACID 500 MG PO CHEW: 2.0000 | CHEWABLE_TABLET | ORAL | Status: DC | PRN

## 2022-04-26 MED ORDER — MESALAMINE 1.2 G PO TBEC
1.2000 g | DELAYED_RELEASE_TABLET | Freq: Two times a day (BID) | ORAL | Status: DC
  Filled 2022-04-26 (×2): qty 1

## 2022-04-26 MED ORDER — ACETAMINOPHEN 325 MG PO TABS: 650.0000 mg | ORAL_TABLET | ORAL | Status: DC | PRN

## 2022-04-26 MED ORDER — MESALAMINE 1.2 G PO TBEC
2.4000 g | DELAYED_RELEASE_TABLET | Freq: Two times a day (BID) | ORAL | Status: DC
  Administered 2022-04-26 – 2022-04-27 (×2): 2.4 g via ORAL
  Filled 2022-04-26 (×4): qty 2

## 2022-04-26 MED ORDER — MAGNESIUM SULFATE 40 GM/1000ML IV SOLN
2.0000 g/h | INTRAVENOUS | Status: AC
  Administered 2022-04-26 – 2022-04-27 (×3): 2 g/h via INTRAVENOUS
  Filled 2022-04-26 (×2): qty 1000

## 2022-04-26 MED ORDER — LACTATED RINGERS IV SOLN: INTRAVENOUS | Status: DC

## 2022-04-26 MED ORDER — PRENATAL MULTIVITAMIN CH
1.0000 | ORAL_TABLET | Freq: Every day | ORAL | Status: DC
  Administered 2022-04-27: 1 via ORAL
  Filled 2022-04-26: qty 1

## 2022-04-26 MED ORDER — LACTATED RINGERS IV BOLUS
1000.0000 mL | Freq: Once | INTRAVENOUS | Status: AC
  Administered 2022-04-26: 1000 mL via INTRAVENOUS

## 2022-04-26 MED ORDER — MAGNESIUM SULFATE BOLUS VIA INFUSION
4.0000 g | Freq: Once | INTRAVENOUS | Status: AC
  Administered 2022-04-26: 4 g via INTRAVENOUS
  Filled 2022-04-26: qty 1000

## 2022-04-26 MED ORDER — ASPIRIN 81 MG PO TBEC
81.0000 mg | DELAYED_RELEASE_TABLET | Freq: Every day | ORAL | Status: DC
  Administered 2022-04-27: 81 mg via ORAL
  Filled 2022-04-26: qty 1

## 2022-04-26 MED ORDER — URSODIOL 300 MG PO CAPS
600.0000 mg | ORAL_CAPSULE | Freq: Two times a day (BID) | ORAL | Status: DC
  Administered 2022-04-26 – 2022-04-27 (×2): 600 mg via ORAL
  Filled 2022-04-26 (×3): qty 2

## 2022-04-26 MED ORDER — FOLIC ACID 1 MG PO TABS
1.0000 mg | ORAL_TABLET | Freq: Every day | ORAL | Status: DC
  Administered 2022-04-27: 1 mg via ORAL
  Filled 2022-04-26: qty 1

## 2022-04-26 MED ORDER — DOCUSATE SODIUM 100 MG PO CAPS: 100.0000 mg | ORAL_CAPSULE | Freq: Every day | ORAL | Status: DC

## 2022-04-26 NOTE — MAU Note (Signed)
.  Jamie Duarte is a 42 y.o. at [redacted]w[redacted]d here in MAU reporting: intermittent cramping and belly tightness since yesterday around 2030. Rating it 4/10. Pain hasn't increased but still going on this morning. +FM, no vaginal bleeding or abnormal discharge   Onset of complaint: 04/25/2022 Pain score: 4/10 Vitals:   04/26/22 1213  BP: (!) 141/87  Pulse: 82  Resp: 18  Temp: 98.6 F (37 C)  SpO2: 100%     FHT:140 Lab orders placed from triage:   UA

## 2022-04-26 NOTE — Consult Note (Signed)
[facility]  Prenatal Consult       04/26/2022  11:18 PM   I was asked by Dr. Garwin Brothers to consult on this patient for possible/anticipated preterm delivery  at [redacted] weeks GA. I had the pleasure of meeting with Jamie Duarte today. She is a 42 yo female with IUP at [redacted]w[redacted]d Pregnancy complicated by cervical incompetence with cerclage, previous loss of multiple pregnancies, chronic HTN, HSV, PSC, ulcerative colitis and AMA. She and her partner are expecting a baby girl.  I explained that the neonatal intensive care team would be present for the delivery and outlined the likely delivery room course for this baby including routine resuscitation and NRP-guided approaches to the treatment of respiratory distress. We discussed other common problems associated with prematurity including respiratory distress syndrome/CLD, apnea, feeding issues, temperature regulation, and infection risk. We briefly discussed IVH/PVL, ROP, and NEC and that these are complications associated with prematurity, but that by 30 weeks are uncommon.   We discussed the average length of stay but I noted that the actual LOS would depend on the severity of problems encountered and response to treatments. We discussed visitation policies and the resources available while her child is in the hospital.  We discussed the importance of good nutrition and various methods of providing nutrition (parenteral hyperalimentation, gavage feedings and/or oral feeding). We discussed the benefits of human milk. I encouraged breast feeding and pumping soon after birth and outlined resources that are available to support breast feeding. We discussed the possibility of using donor breast milk as a bridge.  Thank you for involving [facility] the care of this patient. A member of our team will be available should the family have additional questions.    EDavonna Belling MD Attending Neonatologist   I spent ~35 minutes in  consultation time, of which 20 minutes was spent in direct face to face counseling.

## 2022-04-26 NOTE — H&P (Signed)
Jamie Duarte is a 42 y.o. female presenting @ 52 3/[redacted] wk gestation with c/o contractions since last night not responsive to oral hydration and rest. Poor OB hx with dx of cervical incompetence with twins. PTB with neonatal demise. Chronic HTN controlled on med. . OB History     Gravida  4   Para  2   Term      Preterm  2   AB  1   Living  0      SAB  1   IAB      Ectopic      Multiple  1   Live Births  3          Past Medical History:  Diagnosis Date   Family history of adverse reaction to anesthesia    mom hard to wake up   Herpes    Hypertension    PSC (primary sclerosing cholangitis)    Ulcerative colitis ([place])    Vaginal Pap smear, abnormal    Past Surgical History:  Procedure Laterality Date   BREAST LUMPECTOMY Left 1998   CERCLAGE REMOVAL     CERVICAL CERCLAGE N/A 12/28/2021   Procedure: CERCLAGE CERVICAL;  Surgeon: Jamie Salina, MD;  Location: [facility] LD ORS;  Service: Gynecology;  Laterality: N/A;   CESAREAN SECTION     LEEP     Family History: family history includes Cancer in her mother; Healthy in her brother, father, and mother. Social History:  reports that she has never smoked. She has never used smokeless tobacco. She reports that she does not drink alcohol and does not use drugs.     Maternal Diabetes: No Genetic Screening: Normal Maternal Ultrasounds/Referrals: Normal Fetal Ultrasounds or other Referrals:  Referred to Materal Fetal Medicine  Maternal Substance Abuse:  No Significant Maternal Medications:  Meds include: Other:  Significant Maternal Lab Results:  None Number of Prenatal Visits:greater than 3 verified prenatal visits Other Comments:   hx PTB with subsequent neonatal death. Cervical incompetence, chronic HTN  controlled on med followed by Dr Jamie Duarte, PSC, Ulcerative colitis,,AMA, uterine fibroids. Previous Classical C?S  Review of Systems  All other systems reviewed and are negative.  Maternal Medical History:   Reason for admission: Contractions.   Contractions: Onset was 13-24 hours ago.   Fetal activity: Perceived fetal activity is normal.   Prenatal complications: PIH and preterm labor.   Prenatal Complications - Diabetes: none.     Blood pressure 138/79, pulse 71, temperature 98.6 F (37 C), temperature source Oral, resp. rate 18, height '5\' 7"'$  (1.702 m), weight 87.1 kg, last menstrual period 09/25/2021, SpO2 100 %. Maternal Exam:  Uterine Assessment: Contraction strength is mild.  Contraction frequency is regular.  Abdomen: Patient reports no abdominal tenderness. Surgical scars: low transverse.   Fetal presentation: breech Cervix: Cervix evaluated by digital exam.     Physical Exam Constitutional:      Appearance: She is well-developed.  HENT:     Head: Atraumatic.  Cardiovascular:     Rate and Rhythm: Regular rhythm.  Pulmonary:     Breath sounds: Normal breath sounds.  Abdominal:     General: Bowel sounds are normal.     Palpations: Abdomen is soft.     Comments: Gravid. Pfannenstiel skin incision  Genitourinary:    Comments: Cervix: cerclage barely attached. Cervix closed/thinning. PP not palpable Uterus gravid Adnexa non palpable Skin:    General: Skin is warm and dry.  Neurological:     Mental Status: She is  alert.     Prenatal labs: ABO, Rh: --/--/PENDING (03/09 1246) Antibody: PENDING (03/09 1246) Rubella:  Immune RPR:   NR HBsAg:   neg HIV:   neg GBS:   not done Tracing: baseline 145-150 good variability  Ctx q 2 mins  [place] MFM OB FOLLOW UP  Result Date: 04/09/2022 ----------------------------------------------------------------------  OBSTETRICS REPORT                       (Signed Final 04/09/2022 08:30 am) ---------------------------------------------------------------------- Patient Info  ID #:       [id number]                          D.O.B.:  04-Aug-1980 (41 yrs)  Name:       Jamie Duarte                Visit Date: 04/09/2022 07:36 am               [name] ---------------------------------------------------------------------- Performed By  Attending:        Tama High MD        Ref. Address:     Jamie Duarte                                                             OB/GYN                                                             [address]                                                             [place]                                                             [place]  Performed By:     Jamie Duarte RDMS     Location:         [facility]                                                             [place] [facility]                                                             [facility]  Referred By:      Jamie Duarte                    Jamie Cerino MD ---------------------------------------------------------------------- Orders  #  Description                           Code        Ordered By  1  [place] MFM OB FOLLOW UP                   [phone]    Jamie Duarte ----------------------------------------------------------------------  #  Order #                     Accession #                Episode #  1  [id number]                   [id number]                 [id number] ---------------------------------------------------------------------- Indications  [redacted] weeks gestation of pregnancy                Z3A.28  Cervical cerclage suture present, second       O34.32  trimester (12/28/21)  Advanced maternal age multigravida 16+,        O15.522  second trimester (70 yrs)  Hypertension - Chronic/Pre-existing            O10.019  Poor obstetric history (prior pre-term labor)  O09.219  Uterine fibroids                               O34.10  Cervical incompetence, second trimester        O34.32  Antenatal follow-up for nonvisualized fetal    Z36.2  anatomy  Neg Materniti 21  Medical complication of pregnancy (SSA         O26.90   antibodies)  Normal FE ---------------------------------------------------------------------- Fetal Evaluation  Num Of Fetuses:         1  Fetal Heart Rate(bpm):  149  Cardiac Activity:       Observed  Presentation:           Breech  Placenta:               Posterior  P. Cord Insertion:      Visualized  Amniotic Fluid  AFI FV:      Within normal limits  AFI Sum(cm)     %Tile       Largest Pocket(cm)  21.15           86  6.95  RUQ(cm)       RLQ(cm)       LUQ(cm)        LLQ(cm)  5.53          6.95          4.75           3.92 ---------------------------------------------------------------------- Biometry  BPD:      72.2  mm     G. Age:  29w 0d         70  %    CI:        73.44   %    70 - 86                                                          FL/HC:      19.8   %    18.8 - 20.6  HC:      267.7  mm     G. Age:  29w 1d         56  %    HC/AC:      1.09        1.05 - 1.21  AC:      245.6  mm     G. Age:  28w 6d         67  %    FL/BPD:     73.5   %    71 - 87  FL:       53.1  mm     G. Age:  28w 1d         41  %    FL/AC:      21.6   %    20 - 24  Est. FW:    1262  gm    2 lb 13 oz      63  % ---------------------------------------------------------------------- OB History  Gravidity:    4         Term:   0        Prem:   2        SAB:   1  TOP:          0       Ectopic:  0        Living: 0 ---------------------------------------------------------------------- Gestational Age  LMP:           28w 0d        Date:  09/25/21                   EDD:   07/02/22  U/S Today:     28w 6d                                        EDD:   06/26/22  Best:          Timothy Lasso 0d     Det. By:  LMP  (09/25/21)          EDD:   07/02/22 ---------------------------------------------------------------------- Anatomy  Cranium:               Appears normal         Stomach:  Appears normal, left                                                                        sided  Cerebellum:            Appears normal         Kidneys:                 Appear normal  Posterior Fossa:       Appears normal         Bladder:                Appears normal  Heart:                 Appears normal                         (4CH, axis, and                         situs)  Other:  All anatomy previously seen ---------------------------------------------------------------------- Impression  -Primary sclerosing cholangitis.  Patient takes Actigall and  cholestyramine.  She does not have itching symptom.  -Ulcerative colitis.  Stable.  -Advanced maternal age.  -Patient had prophylactic cerclage in this pregnancy.  No  symptoms of preterm labor.  -Chronic hypertension.  Patient takes metoprolol and  amlodipine.  Blood pressure today at her office is 123/73  mmHg.  -Previous classical cesarean delivery.  Amniotic fluid is normal good fetal activity seen.  Fetal growth  is appropriate for gestational age.  I reassured the couple of the findings. ---------------------------------------------------------------------- Recommendations  -An appointment was made for her to return in 4 weeks for  fetal growth assessment.  -Weekly BPP from [redacted] weeks gestation till delivery.  -Delivery at [redacted] weeks gestation. ----------------------------------------------------------------------                 Jamie High, MD Electronically Signed Final Report   04/09/2022 08:30 am ----------------------------------------------------------------------     Assessment/Plan: PMC/PTL @ 30 3/7 week Previous PTB Previous  Classical cesarean section Cervical incompetence with cerclage Poor obstetrical hx  AMA Chronic HTN in third trimester UC PSC( primary sclerosing cholangitis)  P) admit. Routine labs. IV  Magnesium for PMC, BMZ x 2. Continue BP med. BPP for NR NST. U/a. Ucx sent.   Cire Deyarmin A Vir [place] 04/26/2022, 1:38 PM

## 2022-04-27 DIAGNOSIS — K8301 Primary sclerosing cholangitis: Secondary | ICD-10-CM | POA: Diagnosis not present

## 2022-04-27 DIAGNOSIS — K5190 Ulcerative colitis, unspecified, without complications: Secondary | ICD-10-CM | POA: Diagnosis not present

## 2022-04-27 DIAGNOSIS — O34219 Maternal care for unspecified type scar from previous cesarean delivery: Secondary | ICD-10-CM | POA: Diagnosis not present

## 2022-04-27 DIAGNOSIS — O09513 Supervision of elderly primigravida, third trimester: Secondary | ICD-10-CM | POA: Diagnosis not present

## 2022-04-27 DIAGNOSIS — O10913 Unspecified pre-existing hypertension complicating pregnancy, third trimester: Secondary | ICD-10-CM | POA: Diagnosis not present

## 2022-04-27 DIAGNOSIS — O3433 Maternal care for cervical incompetence, third trimester: Secondary | ICD-10-CM | POA: Diagnosis not present

## 2022-04-27 DIAGNOSIS — Z3A30 30 weeks gestation of pregnancy: Secondary | ICD-10-CM | POA: Diagnosis not present

## 2022-04-27 DIAGNOSIS — O09213 Supervision of pregnancy with history of pre-term labor, third trimester: Secondary | ICD-10-CM | POA: Diagnosis not present

## 2022-04-27 LAB — CULTURE, OB URINE
Culture: NO GROWTH
Special Requests: NORMAL

## 2022-04-27 MED ORDER — TETANUS-DIPHTH-ACELL PERTUSSIS 5-2.5-18.5 LF-MCG/0.5 IM SUSY: 0.5000 mL | PREFILLED_SYRINGE | Freq: Once | INTRAMUSCULAR | Status: DC

## 2022-04-27 NOTE — Discharge Summary (Signed)
Physician Discharge Summary  Patient ID: Jamie Duarte MRN: [id number] DOB/AGE: 04-02-1980 42 y.o.  Admit date: 04/26/2022 Discharge date: 04/27/2022  Admission Diagnoses: Preterm labor in third trimester, previous preterm delivery, cervical incompetence, chronic HTN in pregnancy, third trimester, primary sclerosing cholangitis, IUP @ 30 3/7 wk, ulcerative colitis, AMA  Discharge Diagnoses: same Principal Problem:   Preterm labor in third trimester   Discharged Condition: stable  Hospital Course: pt presented to MAU where she was noted to be contracting q 2 mins. (+) cerclage in place. She was admitted and started  On Magnesium sulfate x 24 hr . BMZ x 2. NR NST led to BPP ordered. Pt was placed on antepartum. NICU consult done. IVF bolus.  Contractions Abated. Continued on home  BP medications   Consults:  NICU  Significant Diagnostic Studies: labs:  Recent Results (from the past 2160 hour(s))  Urinalysis, Routine w reflex microscopic Urine, Clean Catch     Status: Abnormal   Collection Time: 02/14/22  3:00 AM  Result Value Ref Range   Color, Urine STRAW (A) YELLOW   APPearance CLEAR CLEAR   Specific Gravity, Urine 1.008 1.005 - 1.030   pH 6.0 5.0 - 8.0   Glucose, UA NEGATIVE NEGATIVE mg/dL   Hgb urine dipstick NEGATIVE NEGATIVE   Bilirubin Urine NEGATIVE NEGATIVE   Ketones, ur NEGATIVE NEGATIVE mg/dL   Protein, ur NEGATIVE NEGATIVE mg/dL   Nitrite NEGATIVE NEGATIVE   Leukocytes,Ua NEGATIVE NEGATIVE    Comment: Performed at [facility] [address] [place], [place] [address]  Culture, [place] Urine     Status: None   Collection Time: 02/14/22  3:00 AM   Specimen: Urine, Random  Result Value Ref Range   Specimen Description URINE, RANDOM    Special Requests NONE    Culture      NO GROWTH NO GROUP B STREP (S.AGALACTIAE) ISOLATED Performed at [facility], [place] [address], [place],  [address]    Report Status 02/15/2022 FINAL   Comprehensive  metabolic panel     Status: Abnormal   Collection Time: 02/14/22 10:06 AM  Result Value Ref Range   Glucose 78 70 - 99 mg/dL   BUN 8 6 - 24 mg/dL   Creatinine, Ser 0.65 0.57 - 1.00 mg/dL   eGFR 112 >59 mL/min/1.73   BUN/Creatinine Ratio 12 9 - 23   Sodium 134 134 - 144 mmol/L   Potassium 3.7 3.5 - 5.2 mmol/L   Chloride 102 96 - 106 mmol/L   CO2 22 20 - 29 mmol/L   Calcium 9.1 8.7 - 10.2 mg/dL   Total Protein 6.7 6.0 - 8.5 g/dL   Albumin 3.5 (L) 3.9 - 4.9 g/dL   Globulin, Total 3.2 1.5 - 4.5 g/dL   Albumin/Globulin Ratio 1.1 (L) 1.2 - 2.2   Bilirubin Total 0.5 0.0 - 1.2 mg/dL   Alkaline Phosphatase 301 (H) 44 - 121 IU/L   AST 17 0 - 40 IU/L   ALT 18 0 - 32 IU/L  Magnesium     Status: None   Collection Time: 02/14/22 10:06 AM  Result Value Ref Range   Magnesium 2.0 1.6 - 2.3 mg/dL  Type and screen [place]     Status: None   Collection Time: 04/26/22 12:46 PM  Result Value Ref Range   ABO/RH(D) O POS    Antibody Screen NEG    Sample Expiration      04/29/2022,2359 Performed at [facility],  [address], [place], [place] [address]   Urinalysis, Routine w reflex microscopic -Urine, Clean Catch     Status: Abnormal   Collection Time: 04/26/22 12:48 PM  Result Value Ref Range   Color, Urine STRAW (A) YELLOW   APPearance CLEAR CLEAR   Specific Gravity, Urine 1.008 1.005 - 1.030   pH 7.0 5.0 - 8.0   Glucose, UA NEGATIVE NEGATIVE mg/dL   Hgb urine dipstick NEGATIVE NEGATIVE   Bilirubin Urine NEGATIVE NEGATIVE   Ketones, ur NEGATIVE NEGATIVE mg/dL   Protein, ur 30 (A) NEGATIVE mg/dL   Nitrite NEGATIVE NEGATIVE   Leukocytes,Ua NEGATIVE NEGATIVE   RBC / HPF 0-5 0 - 5 RBC/hpf   WBC, UA 0-5 0 - 5 WBC/hpf   Bacteria, UA RARE (A) NONE SEEN   Squamous Epithelial / HPF 0-5 0 - 5 /HPF   Mucus PRESENT     Comment: Performed at [facility], [address], [place], [place] [address]  Culture, [place] Urine     Status: None   Collection Time: 04/26/22  12:48 PM   Specimen: OB Clean Catch; Urine  Result Value Ref Range   Specimen Description OB CLEAN CATCH    Special Requests Normal    Culture      NO GROWTH Performed at [facility], [place] [address] [place], [place] [address]    Report Status 04/27/2022 FINAL   CBC with Differential/Platelet     Status: None   Collection Time: 04/26/22 12:48 PM  Result Value Ref Range   WBC 9.2 4.0 - 10.5 K/uL   RBC 4.29 3.87 - 5.11 MIL/uL   Hemoglobin 13.6 12.0 - 15.0 g/dL   HCT 38.8 36.0 - 46.0 %   MCV 90.4 80.0 - 100.0 fL   MCH 31.7 26.0 - 34.0 pg   MCHC 35.1 30.0 - 36.0 g/dL   RDW 12.4 11.5 - 15.5 %   Platelets 304 150 - 400 K/uL   nRBC 0.0 0.0 - 0.2 %   Neutrophils Relative % 75 %   Neutro Abs 6.9 1.7 - 7.7 K/uL   Lymphocytes Relative 16 %   Lymphs Abs 1.4 0.7 - 4.0 K/uL   Monocytes Relative 8 %   Monocytes Absolute 0.8 0.1 - 1.0 K/uL   Eosinophils Relative 0 %   Eosinophils Absolute 0.0 0.0 - 0.5 K/uL   Basophils Relative 0 %   Basophils Absolute 0.0 0.0 - 0.1 K/uL   Immature Granulocytes 1 %   Abs Immature Granulocytes 0.07 0.00 - 0.07 K/uL    Comment: Performed at [facility], [address] [place], [place] [address]  Comprehensive metabolic panel     Status: Abnormal   Collection Time: 04/26/22  1:26 PM  Result Value Ref Range   Sodium 136 135 - 145 mmol/L   Potassium 3.5 3.5 - 5.1 mmol/L   Chloride 104 98 - 111 mmol/L   CO2 22 22 - 32 mmol/L   Glucose, Bld 73 70 - 99 mg/dL    Comment: Glucose reference range applies only to samples taken after fasting for at least 8 hours.   BUN 5 (L) 6 - 20 mg/dL   Creatinine, Ser 0.67 0.44 - 1.00 mg/dL   Calcium 9.2 8.9 - 10.3 mg/dL   Total Protein 7.1 6.5 - 8.1 g/dL   Albumin 2.7 (L) 3.5 - 5.0 g/dL   AST 23 15 - 41 U/L   ALT 22 0 - 44 U/L   Alkaline  Phosphatase 279 (H) 38 - 126 U/L   Total Bilirubin 0.9 0.3 - 1.2 mg/dL   GFR, Estimated >60 >60 mL/min    Comment: (NOTE) Calculated using the CKD-EPI Creatinine  Equation (2021)    Anion gap 10 5 - 15    Comment: Performed at [facility] [address], [place], [place] [address]     and BPP [place] MFM FETAL BPP WO NON STRESS  Result Date: 04/26/2022 ----------------------------------------------------------------------  OBSTETRICS REPORT                        (Signed Final 04/26/2022 07:15 pm) ---------------------------------------------------------------------- Patient Info  ID #:       [id number]                          D.O.B.:  [birth year] (41 yrs)  Name:       Jamie Duarte                Visit Date: 04/26/2022 01:41 pm              Reise ---------------------------------------------------------------------- Performed By  Attending:        Valeda Malm DO       Ref. Address:      [facility]                                                              OB/GYN                                                              [address]                                                              [place]                                                              [place]  Performed By:     Stephenie Acres        [facility]  Referred By:      Alanda Slim                    Ziya Coonrod MD ---------------------------------------------------------------------- Orders  #  Description                           Code        Ordered By  1  [place] MFM FETAL BPP WO NON               76819.01    Amit Meloy     STRESS                                            Mistey Hoffert ----------------------------------------------------------------------  #  Order #                     Accession #                Episode #  1  [id number]                   [id number]                 [id number] ---------------------------------------------------------------------- Indications  Preterm labor                                    O60.10X0  Non-reactive NST                                O28.9  Hypertension - Chronic/Pre-existing             O10.019  Cervical cerclage suture present, third         O34.33  trimester (12/28/21)  Cervical incompetence, third trimester          O34.33  Advanced maternal age primigravida 76+,         O62.513  third trimester (63 yrs)  Poor obstetric history (prior pre-term labor)   O09.219  Uterine fibroids                                O34.10  Antenatal follow-up for nonvisualized fetal     Z36.2  anatomy  Neg Materniti 21  Medical complication of pregnancy (SSA          O26.90  antibodies)  Normal FE  [redacted] weeks gestation of pregnancy                 Z3A.30 ---------------------------------------------------------------------- Fetal Evaluation  Num Of Fetuses:          1  Fetal Heart Rate(bpm):   137  Cardiac Activity:        Observed  Presentation:            Cephalic  Placenta:                Posterior  P. Cord Insertion:       Previously visualized  Amniotic Fluid  AFI FV:      Within normal limits  AFI Sum(cm)     %Tile       Largest Pocket(cm)  15.3  54          6.4  RUQ(cm)       RLQ(cm)       LUQ(cm)        LLQ(cm)  3.3           1.9           6.4            3.7 ---------------------------------------------------------------------- Biophysical Evaluation  Amniotic F.V:   Pocket => 2 cm             F. Tone:         Observed  F. Movement:    Observed                   Score:           8/8  F. Breathing:   Observed ---------------------------------------------------------------------- OB History  Gravidity:    4         Term:   0        Prem:   2        SAB:   1  TOP:          0       Ectopic:  0        Living: 0 ---------------------------------------------------------------------- Gestational Age  LMP:           30w 3d        Date:  09/25/21                   EDD:   07/02/22  Best:          Weston Settle 3d     Det. By:  LMP  (09/25/21)          EDD:   07/02/22  ---------------------------------------------------------------------- Anatomy  Diaphragm:             Appears normal         Kidneys:                Appear normal  Stomach:               Appears normal, left   Bladder:                Appears normal                         sided ---------------------------------------------------------------------- Comments  Hospital Ultrasound  30w 3d at the MAU for contractions in the setting of a prior  classical CD, cervical incompetence with a cerclage in place,  PSC( primary sclerosing cholangitis), AMA and CHTN. NST  was not reactive so BPP was requested. EDD: 07/02/2022 by  LMP  (09/25/21).  Sonographic findings  Single intrauterine pregnancy.  Fetal cardiac activity: Observed and appears normal.  Presentation: Cephalic.  Limited fetal anatomy appears normal.  Amniotic fluid volume: Within normal limits. AFI: 15.3 cm.  MVP: 6.4 cm.  Placenta: Posterior. There is no sonographic evidence of  bleeding.  BPP 8/8.  Recommendations  - Patient is admitted for preterm Labor under Dr. Garwin Brothers.  Agree with plan in Epic (mag, BMZ, NICU consult, BP  monitoring)  - Continue clinical management per OB provider  This was a limited ultrasound with a remote read. If an official  MFM consult is requested for any reason please call/place an  order in Epic. ----------------------------------------------------------------------  Valeda Malm, DO Electronically Signed Final Report   04/26/2022 07:15 pm ----------------------------------------------------------------------    Treatments: IV magnesium, BMZ  Discharge Exam: Blood pressure 132/83, pulse 86, temperature 97.9 F (36.6 C), temperature source Oral, resp. rate 16, height 5\' 7"  (1.702 m), weight 87.5 kg, last menstrual period 09/25/2021, SpO2 100 %. General appearance: alert, cooperative, and no distress Resp: clear to auscultation bilaterally Cardio: regular rate and rhythm, S1, S2 normal, no murmur, click, rub or  gallop GI: gravid soft Extremities: extremities normal, atraumatic, no cyanosis or edema  Disposition: Discharge disposition: 01-Home or Self Care       Discharge Instructions     Activity as tolerated - No restrictions   Complete by: As directed    Diet - low sodium heart healthy   Complete by: As directed    Discharge instructions   Complete by: As directed    Abstain from intercourse. Call if 6 or more contractions per hour. Decreased fetal movement, vaginal bleeding. Decrease work to 8hr/d. Keep OB appt      Allergies as of 04/27/2022   No Known Allergies      Medication List     TAKE these medications    amLODipine 10 MG tablet Commonly known as: NORVASC Take 1 tablet (10 mg total) by mouth daily.   aspirin 81 MG chewable tablet Chew 81 mg by mouth daily.   cholestyramine 4 g packet Commonly known as: Questran Take by mouth as directed twice daily. What changed:  how much to take how to take this when to take this   Fish Oil 1000 MG Caps Take 1,000 mg by mouth daily.   folic acid 1 MG tablet Commonly known as: FOLVITE Take 1 tablet by mouth every day .   mesalamine 1.2 g EC tablet Commonly known as: LIALDA Take 2 tablets (2.4 g total) by mouth twice daily   metoprolol succinate 100 MG 24 hr tablet Commonly known as: TOPROL-XL Take 1 tablet (100 mg total) by mouth daily for blood pressure.   PRENATAL PO Take 1 tablet by mouth daily.   ursodiol 300 MG capsule Commonly known as: ACTIGALL Take 2 capsules by mouth 2 times a day.        Follow-up Information     Servando Salina, MD Follow up on 04/30/2022.   Specialty: Obstetrics and Gynecology Contact information: [place] [address] [phone]                 Signed: Marvene Staff 04/27/2022, 3:24 PM

## 2022-04-27 NOTE — Progress Notes (Addendum)
HD #2 30 4/7 wk PTL on Magnesium Hx PTB BMZ #1 [place]cl[place] Chronic HTN  S: feels better. Denies cramping/ctx (+) FM. Earlier h/a resolved  O: Patient Vitals for the past 24 hrs:  BP Temp Temp src Pulse Resp SpO2 Height Weight  04/27/22 0500 -- -- -- -- 18 -- -- --  04/27/22 0459 -- -- -- -- -- -- -- 87.5 kg  04/27/22 0457 123/82 98.9 F (37.2 C) Oral 79 16 100 % -- --  04/27/22 0400 -- -- -- -- 15 -- -- --  04/27/22 0300 -- -- -- -- 16 -- -- --  04/27/22 0141 132/82 97.6 F (36.4 C) Oral 85 18 99 % -- --  04/27/22 0100 -- -- -- -- 16 -- -- --  04/27/22 0000 -- -- -- -- 15 -- -- --  04/26/22 2300 -- -- -- -- 16 -- -- --  04/26/22 2210 -- -- -- -- -- 97 % -- --  04/26/22 2205 -- -- -- -- -- 96 % -- --  04/26/22 2201 124/77 -- -- 90 15 -- -- --  04/26/22 2200 -- -- -- -- -- 97 % -- --  04/26/22 2155 -- -- -- -- -- 98 % -- --  04/26/22 2150 -- -- -- -- -- 97 % -- --  04/26/22 2145 -- -- -- -- -- 98 % -- --  04/26/22 2140 -- -- -- -- -- 98 % -- --  04/26/22 2135 -- -- -- -- -- 98 % -- --  04/26/22 2130 -- -- -- -- -- 98 % -- --  04/26/22 2125 -- -- -- -- -- 97 % -- --  04/26/22 2120 -- -- -- -- -- 98 % -- --  04/26/22 2101 135/83 -- -- 90 16 -- -- --  04/26/22 2100 -- -- -- -- -- 97 % -- --  04/26/22 2055 -- -- -- -- -- 98 % -- --  04/26/22 2050 -- -- -- -- -- 98 % -- --  04/26/22 2045 -- -- -- -- -- 98 % -- --  04/26/22 2040 -- -- -- -- -- 98 % -- --  04/26/22 2035 -- -- -- -- -- 98 % -- --  04/26/22 2030 -- -- -- -- -- 98 % -- --  04/26/22 2025 -- -- -- -- -- 98 % -- --  04/26/22 2020 -- -- -- -- -- 98 % -- --  04/26/22 2017 (!) 146/89 97.6 F (36.4 C) Oral 94 18 98 % -- --  04/26/22 2015 -- -- -- -- -- 98 % -- --  04/26/22 2010 -- -- -- -- -- 97 % -- --  04/26/22 2005 -- -- -- -- -- 98 % -- --  04/26/22 2002 (!) 145/94 -- -- 90 -- -- -- --  04/26/22 1940 -- -- -- -- -- 99 % -- --  04/26/22 1935 -- -- -- -- -- 99 % -- --  04/26/22 1930 -- -- -- -- -- 99 % -- --   04/26/22 1925 -- -- -- -- -- 99 % -- --  04/26/22 1920 -- -- -- -- -- 99 % -- --  04/26/22 1915 -- -- -- -- -- 98 % -- --  04/26/22 1910 -- -- -- -- -- 99 % -- --  04/26/22 1905 -- -- -- -- -- 99 % -- --  04/26/22 1901 (!) 138/92 -- -- 90 18 99 % -- --  04/26/22 1900 -- -- -- -- -- 100 % -- --  04/26/22 1850 -- -- -- -- -- 97 % -- --  04/26/22 1845 -- -- -- -- -- 98 % -- --  04/26/22 1840 -- -- -- -- -- 98 % -- --  04/26/22 1835 -- -- -- -- -- 98 % -- --  04/26/22 1830 -- -- -- -- -- 99 % -- --  04/26/22 1825 -- -- -- -- -- 99 % -- --  04/26/22 1820 -- -- -- -- -- 99 % -- --  04/26/22 1815 -- -- -- -- -- 98 % -- --  04/26/22 1810 -- -- -- -- -- 98 % -- --  04/26/22 1805 -- -- -- -- -- 98 % -- --  04/26/22 1800 (!) 140/85 -- -- 91 18 98 % -- --  04/26/22 1750 -- -- -- -- -- 99 % -- --  04/26/22 1745 -- -- -- -- -- 98 % -- --  04/26/22 1740 -- -- -- -- -- 99 % -- --  04/26/22 1735 -- -- -- -- -- 99 % -- --  04/26/22 1730 -- -- -- -- -- 100 % -- --  04/26/22 1725 -- -- -- -- -- 100 % -- --  04/26/22 1720 -- -- -- -- -- 99 % -- --  04/26/22 1715 -- -- -- -- -- 100 % -- --  04/26/22 1710 -- -- -- -- -- 100 % -- --  04/26/22 1705 -- -- -- -- -- 100 % -- --  04/26/22 1700 134/88 98.6 F (37 C) Oral 90 18 99 % -- --  04/26/22 1655 -- -- -- -- -- 100 % -- --  04/26/22 1650 -- -- -- -- -- 100 % -- --  04/26/22 1645 -- -- -- -- -- 100 % -- --  04/26/22 1640 -- -- -- -- -- 100 % -- --  04/26/22 1635 -- -- -- -- -- 100 % -- --  04/26/22 1630 -- -- -- -- -- 99 % -- --  04/26/22 1625 -- -- -- -- -- 100 % -- --  04/26/22 1610 -- -- -- -- -- 99 % -- --  04/26/22 1605 -- -- -- -- -- 100 % -- --  04/26/22 1600 (!) 143/87 -- -- 77 -- 100 % -- --  04/26/22 1555 -- -- -- -- -- 100 % -- --  04/26/22 1550 -- -- -- -- -- 100 % -- --  04/26/22 1545 -- -- -- -- -- 100 % -- --  04/26/22 1540 -- -- -- -- -- 100 % -- --  04/26/22 1535 -- -- -- -- -- 100 % -- --  04/26/22 1529 -- -- -- -- -- 99 % --  --  04/26/22 1520 -- -- -- -- -- 100 % -- --  04/26/22 1515 -- -- -- -- -- 100 % -- --  04/26/22 1510 -- -- -- -- -- 100 % -- --  04/26/22 1505 -- -- -- -- -- 100 % -- --  04/26/22 1501 133/86 -- -- 80 18 100 % -- --  04/26/22 1445 -- -- -- -- -- 100 % -- --  04/26/22 1440 -- -- -- -- -- 99 % -- --  04/26/22 1435 -- -- -- -- -- 99 % -- --  04/26/22 1430 -- -- -- -- -- 100 % -- --  04/26/22 1425 132/80 97.9 F (36.6 C) Oral 85 18 100 % -- --  04/26/22 1413 139/85 -- -- 92 -- -- -- --  04/26/22 1410 -- -- -- -- --  100 % -- --  04/26/22 1405 (!) 143/87 -- -- 91 -- 100 % -- --  04/26/22 1400 -- 98.5 F (36.9 C) Oral -- 18 100 % -- --  04/26/22 1359 (!) 143/84 -- -- 85 -- -- -- --  04/26/22 1355 138/89 -- -- 89 -- 100 % -- --  04/26/22 1350 -- 98.5 F (36.9 C) Oral 89 18 100 % -- --  04/26/22 1345 -- 98.6 F (37 C) Oral 88 18 100 % -- --  04/26/22 1235 -- -- -- -- -- 100 % -- --  04/26/22 1233 138/79 -- -- 71 -- -- -- --  04/26/22 1230 -- -- -- -- -- 99 % -- --  04/26/22 1225 -- -- -- -- -- 99 % -- --  04/26/22 1220 -- -- -- -- -- 99 % -- --  04/26/22 1215 -- -- -- -- -- 100 % -- --  04/26/22 1213 (!) 141/87 98.6 F (37 C) Oral 82 18 100 % '5\' 7"'$  (1.702 m) 87.1 kg  04/26/22 1212 (!) 141/87 -- -- 83 -- -- -- --   Lungs clear to A  Cor RRR  Abd gravid soft non tender Pelvic deferred  Extr no edema  Results for orders placed or performed during the hospital encounter of 04/26/22 (from the past 24 hour(s))  Type and screen [place]     Status: None   Collection Time: 04/26/22 12:46 PM  Result Value Ref Range   ABO/RH(D) O POS    Antibody Screen NEG    Sample Expiration      04/29/2022,2359 Performed at [facility], [place] [address], [place], [place] [address]   Urinalysis, Routine w reflex microscopic -Urine, Clean Catch     Status: Abnormal   Collection Time: 04/26/22 12:48 PM  Result Value Ref Range   Color, Urine STRAW (A) YELLOW   APPearance  CLEAR CLEAR   Specific Gravity, Urine 1.008 1.005 - 1.030   pH 7.0 5.0 - 8.0   Glucose, UA NEGATIVE NEGATIVE mg/dL   Hgb urine dipstick NEGATIVE NEGATIVE   Bilirubin Urine NEGATIVE NEGATIVE   Ketones, ur NEGATIVE NEGATIVE mg/dL   Protein, ur 30 (A) NEGATIVE mg/dL   Nitrite NEGATIVE NEGATIVE   Leukocytes,Ua NEGATIVE NEGATIVE   RBC / HPF 0-5 0 - 5 RBC/hpf   WBC, UA 0-5 0 - 5 WBC/hpf   Bacteria, UA RARE (A) NONE SEEN   Squamous Epithelial / HPF 0-5 0 - 5 /HPF   Mucus PRESENT   CBC with Differential/Platelet     Status: None   Collection Time: 04/26/22 12:48 PM  Result Value Ref Range   WBC 9.2 4.0 - 10.5 K/uL   RBC 4.29 3.87 - 5.11 MIL/uL   Hemoglobin 13.6 12.0 - 15.0 g/dL   HCT 38.8 36.0 - 46.0 %   MCV 90.4 80.0 - 100.0 fL   MCH 31.7 26.0 - 34.0 pg   MCHC 35.1 30.0 - 36.0 g/dL   RDW 12.4 11.5 - 15.5 %   Platelets 304 150 - 400 K/uL   nRBC 0.0 0.0 - 0.2 %   Neutrophils Relative % 75 %   Neutro Abs 6.9 1.7 - 7.7 K/uL   Lymphocytes Relative 16 %   Lymphs Abs 1.4 0.7 - 4.0 K/uL   Monocytes Relative 8 %   Monocytes Absolute 0.8 0.1 - 1.0 K/uL   Eosinophils Relative 0 %   Eosinophils Absolute 0.0 0.0 - 0.5 K/uL   Basophils  Relative 0 %   Basophils Absolute 0.0 0.0 - 0.1 K/uL   Immature Granulocytes 1 %   Abs Immature Granulocytes 0.07 0.00 - 0.07 K/uL  Comprehensive metabolic panel     Status: Abnormal   Collection Time: 04/26/22  1:26 PM  Result Value Ref Range   Sodium 136 135 - 145 mmol/L   Potassium 3.5 3.5 - 5.1 mmol/L   Chloride 104 98 - 111 mmol/L   CO2 22 22 - 32 mmol/L   Glucose, Bld 73 70 - 99 mg/dL   BUN 5 (L) 6 - 20 mg/dL   Creatinine, Ser 0.67 0.44 - 1.00 mg/dL   Calcium 9.2 8.9 - 10.3 mg/dL   Total Protein 7.1 6.5 - 8.1 g/dL   Albumin 2.7 (L) 3.5 - 5.0 g/dL   AST 23 15 - 41 U/L   ALT 22 0 - 44 U/L   Alkaline Phosphatase 279 (H) 38 - 126 U/L   Total Bilirubin 0.9 0.3 - 1.2 mg/dL   GFR, Estimated >60 >60 mL/min   Anion gap 10 5 - 15   Tracing:  baseline  120-130 (+) acce to 150.good variability Occ CTX with ui [place] MFM FETAL BPP WO NON STRESS  Result Date: 04/26/2022 ----------------------------------------------------------------------  OBSTETRICS REPORT                        (Signed Final 04/26/2022 07:15 pm) ---------------------------------------------------------------------- Patient Info  ID #:       [id number]                          D.O.B.:  08/05/1980 (41 yrs)  Name:       Jamie Duarte                Visit Date: 04/26/2022 01:41 pm              [place] ---------------------------------------------------------------------- Performed By  Attending:        Valeda Malm DO       Ref. Address:      [facility]                                                              OB[facility]                                                              [address] [phone]  [place]                                                              [place]  Performed By:     Stephenie Acres        [facility]                    [place]  Referred By:      Alanda Slim                    Adasia Hoar MD ---------------------------------------------------------------------- Orders  #  Description                           Code        Ordered By  1  [place] MFM FETAL BPP WO NON               76819.01    Tashawna Thom     STRESS                                            Emina Ribaudo ----------------------------------------------------------------------  #  Order #                     Accession #                Episode #  1  [id number]                   [id number]                 [id number] ---------------------------------------------------------------------- Indications  Preterm labor                                   O60.10X0  Non-reactive NST                                O28.9  Hypertension -  Chronic/Pre-existing             O10.019  Cervical cerclage suture present, third         O34.33  trimester (12/28/21)  Cervical incompetence, third trimester          O34.33  Advanced maternal age primigravida 65+,         O74.513  third trimester (57 yrs)  Poor obstetric history (prior pre-term labor)   O09.219  Uterine fibroids                                O34.10  Antenatal follow-up for nonvisualized fetal     Z36.2  anatomy  Neg Materniti 21  Medical complication of pregnancy (SSA  O26.90  antibodies)  Normal FE  [redacted] weeks gestation of pregnancy                 Z3A.30 ---------------------------------------------------------------------- Fetal Evaluation  Num Of Fetuses:          1  Fetal Heart Rate(bpm):   137  Cardiac Activity:        Observed  Presentation:            Cephalic  Placenta:                Posterior  P. Cord Insertion:       Previously visualized  Amniotic Fluid  AFI FV:      Within normal limits  AFI Sum(cm)     %Tile       Largest Pocket(cm)  15.3            54          6.4  RUQ(cm)       RLQ(cm)       LUQ(cm)        LLQ(cm)  3.3           1.9           6.4            3.7 ---------------------------------------------------------------------- Biophysical Evaluation  Amniotic F.V:   Pocket => 2 cm             F. Tone:         Observed  F. Movement:    Observed                   Score:           8/8  F. Breathing:   Observed ---------------------------------------------------------------------- OB History  Gravidity:    4         Term:   0        Prem:   2        SAB:   1  TOP:          0       Ectopic:  0        Living: 0 ---------------------------------------------------------------------- Gestational Age  LMP:           30w 3d        Date:  09/25/21                   EDD:   07/02/22  Best:          Weston Settle 3d     Det. By:  LMP  (09/25/21)          EDD:   07/02/22 ---------------------------------------------------------------------- Anatomy  Diaphragm:             Appears normal          Kidneys:                Appear normal  Stomach:               Appears normal, left   Bladder:                Appears normal                         sided ---------------------------------------------------------------------- Comments  Hospital Ultrasound  30w 3d at the MAU for contractions in the setting of a prior  classical CD, cervical incompetence with a cerclage in place,  PSC( primary sclerosing  cholangitis), AMA and CHTN. NST  was not reactive so BPP was requested. EDD: 07/02/2022 by  LMP  (09/25/21).  Sonographic findings  Single intrauterine pregnancy.  Fetal cardiac activity: Observed and appears normal.  Presentation: Cephalic.  Limited fetal anatomy appears normal.  Amniotic fluid volume: Within normal limits. AFI: 15.3 cm.  MVP: 6.4 cm.  Placenta: Posterior. There is no sonographic evidence of  bleeding.  BPP 8/8.  Recommendations  - Patient is admitted for preterm Labor under Dr. Garwin Brothers.  Agree with plan in Epic (mag, BMZ, NICU consult, BP  monitoring)  - Continue clinical management per OB provider  This was a limited ultrasound with a remote read. If an official  MFM consult is requested for any reason please call/place an  order in [place]. ----------------------------------------------------------------------                  Valeda Malm, DO Electronically Signed Final Report   04/26/2022 07:15 pm ----------------------------------------------------------------------    IMP: PTL/PMC responsive to Magnesium sulfate Hx PTB Previous Classical C/S Chronic HTN on med PSC UC Cervical incompetence with cerclage IUP @ 30 4/7 wk P) 2nd dose of BMZ at 1 pm. D/c magnesium sulfate thereafter and d/c home Advised to reduce work hours to 8hr/d. T dap today Keep schedule OB appt

## 2022-04-27 NOTE — Plan of Care (Signed)
  Problem: Education: Goal: Knowledge of disease or condition will improve Outcome: Completed/Met Goal: Knowledge of the prescribed therapeutic regimen will improve Outcome: Completed/Met Goal: Individualized Educational Video(s) Outcome: Completed/Met   Problem: Clinical Measurements: Goal: Complications related to the disease process, condition or treatment will be avoided or minimized Outcome: Completed/Met   Problem: Education: Goal: Knowledge of General Education information will improve Description: Including pain rating scale, medication(s)/side effects and non-pharmacologic comfort measures Outcome: Completed/Met   Problem: Health Behavior/Discharge Planning: Goal: Ability to manage health-related needs will improve Outcome: Completed/Met   Problem: Clinical Measurements: Goal: Ability to maintain clinical measurements within normal limits will improve Outcome: Completed/Met Goal: Will remain free from infection Outcome: Completed/Met Goal: Diagnostic test results will improve Outcome: Completed/Met Goal: Respiratory complications will improve Outcome: Completed/Met Goal: Cardiovascular complication will be avoided Outcome: Completed/Met   Problem: Activity: Goal: Risk for activity intolerance will decrease Outcome: Completed/Met   Problem: Nutrition: Goal: Adequate nutrition will be maintained Outcome: Completed/Met   Problem: Coping: Goal: Level of anxiety will decrease Outcome: Completed/Met   Problem: Elimination: Goal: Will not experience complications related to bowel motility Outcome: Completed/Met Goal: Will not experience complications related to urinary retention Outcome: Completed/Met   Problem: Pain Managment: Goal: General experience of comfort will improve Outcome: Completed/Met   Problem: Safety: Goal: Ability to remain free from injury will improve Outcome: Completed/Met   Problem: Skin Integrity: Goal: Risk for impaired skin  integrity will decrease Outcome: Completed/Met   

## 2022-04-27 NOTE — Progress Notes (Signed)
Patient stated not feeling comfortable leaving one hour post mag. Patient agreed to discharge at 1800 today. Patient states she has not felt contractions since discontinuing magnesium at 1429. Discharge instructions provided to patient with no further questions.

## 2022-04-29 ENCOUNTER — Encounter: Payer: Self-pay | Admitting: Cardiology

## 2022-04-30 DIAGNOSIS — O10013 Pre-existing essential hypertension complicating pregnancy, third trimester: Secondary | ICD-10-CM | POA: Diagnosis not present

## 2022-04-30 DIAGNOSIS — O09213 Supervision of pregnancy with history of pre-term labor, third trimester: Secondary | ICD-10-CM | POA: Diagnosis not present

## 2022-04-30 DIAGNOSIS — O09529 Supervision of elderly multigravida, unspecified trimester: Secondary | ICD-10-CM | POA: Diagnosis not present

## 2022-04-30 DIAGNOSIS — K5190 Ulcerative colitis, unspecified, without complications: Secondary | ICD-10-CM | POA: Diagnosis not present

## 2022-04-30 DIAGNOSIS — O09523 Supervision of elderly multigravida, third trimester: Secondary | ICD-10-CM | POA: Diagnosis not present

## 2022-04-30 DIAGNOSIS — O34219 Maternal care for unspecified type scar from previous cesarean delivery: Secondary | ICD-10-CM | POA: Diagnosis not present

## 2022-04-30 DIAGNOSIS — O3433 Maternal care for cervical incompetence, third trimester: Secondary | ICD-10-CM | POA: Diagnosis not present

## 2022-04-30 DIAGNOSIS — K8301 Primary sclerosing cholangitis: Secondary | ICD-10-CM | POA: Diagnosis not present

## 2022-05-07 ENCOUNTER — Ambulatory Visit: Payer: Commercial Managed Care - PPO | Attending: Obstetrics and Gynecology | Admitting: *Deleted

## 2022-05-07 ENCOUNTER — Ambulatory Visit (HOSPITAL_BASED_OUTPATIENT_CLINIC_OR_DEPARTMENT_OTHER): Payer: Commercial Managed Care - PPO

## 2022-05-07 VITALS — BP 125/83 | HR 68

## 2022-05-07 DIAGNOSIS — O09213 Supervision of pregnancy with history of pre-term labor, third trimester: Secondary | ICD-10-CM | POA: Insufficient documentation

## 2022-05-07 DIAGNOSIS — D259 Leiomyoma of uterus, unspecified: Secondary | ICD-10-CM | POA: Diagnosis not present

## 2022-05-07 DIAGNOSIS — O10013 Pre-existing essential hypertension complicating pregnancy, third trimester: Secondary | ICD-10-CM | POA: Diagnosis not present

## 2022-05-07 DIAGNOSIS — O3433 Maternal care for cervical incompetence, third trimester: Secondary | ICD-10-CM

## 2022-05-07 DIAGNOSIS — O3413 Maternal care for benign tumor of corpus uteri, third trimester: Secondary | ICD-10-CM

## 2022-05-07 DIAGNOSIS — Z3689 Encounter for other specified antenatal screening: Secondary | ICD-10-CM

## 2022-05-07 DIAGNOSIS — O34219 Maternal care for unspecified type scar from previous cesarean delivery: Secondary | ICD-10-CM | POA: Diagnosis not present

## 2022-05-07 DIAGNOSIS — O09523 Supervision of elderly multigravida, third trimester: Secondary | ICD-10-CM | POA: Diagnosis not present

## 2022-05-07 DIAGNOSIS — O10919 Unspecified pre-existing hypertension complicating pregnancy, unspecified trimester: Secondary | ICD-10-CM | POA: Diagnosis not present

## 2022-05-07 DIAGNOSIS — O3430 Maternal care for cervical incompetence, unspecified trimester: Secondary | ICD-10-CM | POA: Diagnosis not present

## 2022-05-07 DIAGNOSIS — O10913 Unspecified pre-existing hypertension complicating pregnancy, third trimester: Secondary | ICD-10-CM

## 2022-05-07 DIAGNOSIS — Z3A32 32 weeks gestation of pregnancy: Secondary | ICD-10-CM

## 2022-05-07 DIAGNOSIS — O361930 Maternal care for other isoimmunization, third trimester, not applicable or unspecified: Secondary | ICD-10-CM

## 2022-05-07 DIAGNOSIS — K5190 Ulcerative colitis, unspecified, without complications: Secondary | ICD-10-CM | POA: Diagnosis not present

## 2022-05-07 DIAGNOSIS — O09513 Supervision of elderly primigravida, third trimester: Secondary | ICD-10-CM | POA: Diagnosis not present

## 2022-05-07 DIAGNOSIS — O09293 Supervision of pregnancy with other poor reproductive or obstetric history, third trimester: Secondary | ICD-10-CM | POA: Diagnosis not present

## 2022-05-07 DIAGNOSIS — O1213 Gestational proteinuria, third trimester: Secondary | ICD-10-CM | POA: Diagnosis not present

## 2022-05-07 DIAGNOSIS — K8301 Primary sclerosing cholangitis: Secondary | ICD-10-CM | POA: Diagnosis not present

## 2022-05-08 ENCOUNTER — Ambulatory Visit: Payer: Commercial Managed Care - PPO

## 2022-05-09 DIAGNOSIS — Z331 Pregnant state, incidental: Secondary | ICD-10-CM | POA: Diagnosis not present

## 2022-05-09 DIAGNOSIS — K8301 Primary sclerosing cholangitis: Secondary | ICD-10-CM | POA: Diagnosis not present

## 2022-05-09 DIAGNOSIS — Z3A32 32 weeks gestation of pregnancy: Secondary | ICD-10-CM | POA: Diagnosis not present

## 2022-05-14 ENCOUNTER — Ambulatory Visit: Payer: Commercial Managed Care - PPO | Admitting: *Deleted

## 2022-05-14 ENCOUNTER — Ambulatory Visit: Payer: Commercial Managed Care - PPO | Attending: Obstetrics and Gynecology

## 2022-05-14 VITALS — BP 129/87 | HR 62

## 2022-05-14 DIAGNOSIS — D259 Leiomyoma of uterus, unspecified: Secondary | ICD-10-CM

## 2022-05-14 DIAGNOSIS — O3433 Maternal care for cervical incompetence, third trimester: Secondary | ICD-10-CM | POA: Insufficient documentation

## 2022-05-14 DIAGNOSIS — O10013 Pre-existing essential hypertension complicating pregnancy, third trimester: Secondary | ICD-10-CM | POA: Diagnosis not present

## 2022-05-14 DIAGNOSIS — O09213 Supervision of pregnancy with history of pre-term labor, third trimester: Secondary | ICD-10-CM

## 2022-05-14 DIAGNOSIS — Z3A33 33 weeks gestation of pregnancy: Secondary | ICD-10-CM | POA: Diagnosis not present

## 2022-05-14 DIAGNOSIS — O09529 Supervision of elderly multigravida, unspecified trimester: Secondary | ICD-10-CM | POA: Diagnosis not present

## 2022-05-14 DIAGNOSIS — O10913 Unspecified pre-existing hypertension complicating pregnancy, third trimester: Secondary | ICD-10-CM | POA: Diagnosis not present

## 2022-05-14 DIAGNOSIS — O09523 Supervision of elderly multigravida, third trimester: Secondary | ICD-10-CM | POA: Diagnosis not present

## 2022-05-14 DIAGNOSIS — O10919 Unspecified pre-existing hypertension complicating pregnancy, unspecified trimester: Secondary | ICD-10-CM | POA: Diagnosis not present

## 2022-05-14 DIAGNOSIS — O3413 Maternal care for benign tumor of corpus uteri, third trimester: Secondary | ICD-10-CM

## 2022-05-14 DIAGNOSIS — O34219 Maternal care for unspecified type scar from previous cesarean delivery: Secondary | ICD-10-CM | POA: Diagnosis not present

## 2022-05-14 DIAGNOSIS — O361930 Maternal care for other isoimmunization, third trimester, not applicable or unspecified: Secondary | ICD-10-CM

## 2022-05-14 DIAGNOSIS — K5190 Ulcerative colitis, unspecified, without complications: Secondary | ICD-10-CM | POA: Diagnosis not present

## 2022-05-14 DIAGNOSIS — K8301 Primary sclerosing cholangitis: Secondary | ICD-10-CM | POA: Diagnosis not present

## 2022-05-14 DIAGNOSIS — O1213 Gestational proteinuria, third trimester: Secondary | ICD-10-CM | POA: Diagnosis not present

## 2022-05-15 ENCOUNTER — Encounter: Payer: Self-pay | Admitting: Hematology and Oncology

## 2022-05-15 ENCOUNTER — Other Ambulatory Visit: Payer: Self-pay | Admitting: Cardiology

## 2022-05-15 ENCOUNTER — Other Ambulatory Visit: Payer: Self-pay

## 2022-05-15 MED ORDER — AMLODIPINE BESYLATE 10 MG PO TABS
10.0000 mg | ORAL_TABLET | Freq: Every day | ORAL | 3 refills | Status: DC
  Filled 2022-05-15: qty 90, 90d supply, fill #0
  Filled 2022-08-22 – 2022-08-25 (×2): qty 90, 90d supply, fill #1
  Filled 2022-11-22 – 2022-11-24 (×2): qty 90, 90d supply, fill #2
  Filled 2023-02-17: qty 90, 90d supply, fill #3

## 2022-05-20 ENCOUNTER — Other Ambulatory Visit: Payer: Self-pay | Admitting: Obstetrics and Gynecology

## 2022-05-20 DIAGNOSIS — O09523 Supervision of elderly multigravida, third trimester: Secondary | ICD-10-CM

## 2022-05-20 DIAGNOSIS — O10913 Unspecified pre-existing hypertension complicating pregnancy, third trimester: Secondary | ICD-10-CM

## 2022-05-20 DIAGNOSIS — O3433 Maternal care for cervical incompetence, third trimester: Secondary | ICD-10-CM

## 2022-05-21 ENCOUNTER — Ambulatory Visit: Payer: Commercial Managed Care - PPO | Admitting: *Deleted

## 2022-05-21 ENCOUNTER — Ambulatory Visit: Payer: Commercial Managed Care - PPO | Attending: Obstetrics and Gynecology

## 2022-05-21 VITALS — BP 139/84 | HR 64

## 2022-05-21 DIAGNOSIS — O10013 Pre-existing essential hypertension complicating pregnancy, third trimester: Secondary | ICD-10-CM | POA: Diagnosis not present

## 2022-05-21 DIAGNOSIS — O09523 Supervision of elderly multigravida, third trimester: Secondary | ICD-10-CM | POA: Insufficient documentation

## 2022-05-21 DIAGNOSIS — O3413 Maternal care for benign tumor of corpus uteri, third trimester: Secondary | ICD-10-CM | POA: Diagnosis not present

## 2022-05-21 DIAGNOSIS — O09213 Supervision of pregnancy with history of pre-term labor, third trimester: Secondary | ICD-10-CM

## 2022-05-21 DIAGNOSIS — Z3A34 34 weeks gestation of pregnancy: Secondary | ICD-10-CM

## 2022-05-21 DIAGNOSIS — O10913 Unspecified pre-existing hypertension complicating pregnancy, third trimester: Secondary | ICD-10-CM | POA: Diagnosis not present

## 2022-05-21 DIAGNOSIS — D259 Leiomyoma of uterus, unspecified: Secondary | ICD-10-CM

## 2022-05-21 DIAGNOSIS — O3433 Maternal care for cervical incompetence, third trimester: Secondary | ICD-10-CM | POA: Diagnosis not present

## 2022-05-21 DIAGNOSIS — O361930 Maternal care for other isoimmunization, third trimester, not applicable or unspecified: Secondary | ICD-10-CM

## 2022-05-28 ENCOUNTER — Ambulatory Visit: Payer: Commercial Managed Care - PPO | Admitting: *Deleted

## 2022-05-28 ENCOUNTER — Ambulatory Visit: Payer: Commercial Managed Care - PPO | Attending: Obstetrics and Gynecology

## 2022-05-28 VITALS — BP 130/83 | HR 72

## 2022-05-28 DIAGNOSIS — O10919 Unspecified pre-existing hypertension complicating pregnancy, unspecified trimester: Secondary | ICD-10-CM | POA: Diagnosis not present

## 2022-05-28 DIAGNOSIS — O10013 Pre-existing essential hypertension complicating pregnancy, third trimester: Secondary | ICD-10-CM | POA: Diagnosis not present

## 2022-05-28 DIAGNOSIS — O09213 Supervision of pregnancy with history of pre-term labor, third trimester: Secondary | ICD-10-CM | POA: Diagnosis not present

## 2022-05-28 DIAGNOSIS — Z3685 Encounter for antenatal screening for Streptococcus B: Secondary | ICD-10-CM | POA: Diagnosis not present

## 2022-05-28 DIAGNOSIS — O3413 Maternal care for benign tumor of corpus uteri, third trimester: Secondary | ICD-10-CM | POA: Diagnosis not present

## 2022-05-28 DIAGNOSIS — Z3A35 35 weeks gestation of pregnancy: Secondary | ICD-10-CM

## 2022-05-28 DIAGNOSIS — O34219 Maternal care for unspecified type scar from previous cesarean delivery: Secondary | ICD-10-CM | POA: Diagnosis not present

## 2022-05-28 DIAGNOSIS — O3433 Maternal care for cervical incompetence, third trimester: Secondary | ICD-10-CM | POA: Insufficient documentation

## 2022-05-28 DIAGNOSIS — O10913 Unspecified pre-existing hypertension complicating pregnancy, third trimester: Secondary | ICD-10-CM | POA: Diagnosis not present

## 2022-05-28 DIAGNOSIS — O1213 Gestational proteinuria, third trimester: Secondary | ICD-10-CM | POA: Diagnosis not present

## 2022-05-28 DIAGNOSIS — O361930 Maternal care for other isoimmunization, third trimester, not applicable or unspecified: Secondary | ICD-10-CM

## 2022-05-28 DIAGNOSIS — O09529 Supervision of elderly multigravida, unspecified trimester: Secondary | ICD-10-CM | POA: Diagnosis not present

## 2022-05-28 DIAGNOSIS — O09523 Supervision of elderly multigravida, third trimester: Secondary | ICD-10-CM | POA: Insufficient documentation

## 2022-05-28 DIAGNOSIS — D259 Leiomyoma of uterus, unspecified: Secondary | ICD-10-CM | POA: Diagnosis not present

## 2022-05-28 DIAGNOSIS — O3430 Maternal care for cervical incompetence, unspecified trimester: Secondary | ICD-10-CM | POA: Diagnosis not present

## 2022-05-29 ENCOUNTER — Telehealth (HOSPITAL_COMMUNITY): Payer: Self-pay | Admitting: *Deleted

## 2022-05-29 ENCOUNTER — Encounter (HOSPITAL_COMMUNITY): Payer: Self-pay

## 2022-05-29 NOTE — Telephone Encounter (Signed)
Preadmission screen  

## 2022-05-30 ENCOUNTER — Encounter: Payer: Self-pay | Admitting: Cardiology

## 2022-05-30 ENCOUNTER — Ambulatory Visit: Payer: Commercial Managed Care - PPO | Attending: Cardiology | Admitting: Cardiology

## 2022-05-30 ENCOUNTER — Encounter (HOSPITAL_COMMUNITY): Payer: Self-pay

## 2022-05-30 ENCOUNTER — Other Ambulatory Visit: Payer: Self-pay | Admitting: Obstetrics and Gynecology

## 2022-05-30 VITALS — BP 128/84 | HR 74 | Ht 67.0 in | Wt 203.4 lb

## 2022-05-30 DIAGNOSIS — O10919 Unspecified pre-existing hypertension complicating pregnancy, unspecified trimester: Secondary | ICD-10-CM

## 2022-05-30 DIAGNOSIS — I10 Essential (primary) hypertension: Secondary | ICD-10-CM

## 2022-05-30 DIAGNOSIS — Z3A35 35 weeks gestation of pregnancy: Secondary | ICD-10-CM | POA: Diagnosis not present

## 2022-05-30 NOTE — Progress Notes (Unsigned)
Cardio-Obstetrics Clinic  Follow Up Note   Date:  05/30/2022   ID:  Jamie Duarte, DOB 03/07/1980, MRN [id number]  PCP:  Blair Heys, MD   [facility] Providers Cardiologist:  Thomasene Ripple, DO  Electrophysiologist:  None        Referring MD: Blair Heys, MD   Chief Complaint: " I am doing ok"  History of Present Illness:    Jamie Duarte is a 42 y.o. female [[id number]] who returns for follow up of hypertension.   Medical history includes hypertension which was diagnosed about 6 to 7 years ago currently on amlodipine 5 mg daily, HCTZ 25, metoprolol succinate 100 mg daily.  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  She is concerned that she may have lost this pregnancy at 6 weeks because she has been experiencing some bleeding and clotting.  I saw the patient on May 09, 2021 at that time she was [redacted] weeks pregnant.  She was on amlodipine we increase that to 10 mg daily.  We stopped the hydrochlorothiazide and continue to metoprolol succinate.  We discussed that if her blood pressure is not appropriate to target we would have added hydralazine.  I saw the patient 09/25/2021 at time she had lost the baby.    She was seen on 11/19/2021 at that time I continued her Amlodipine. She was doing well until yesterday she had to go to the ED for cramping.   Saw her in February 2024, at that time no changes were made.  Since I saw the patient she has been taken off work due to significant cramping as well.  She is looking forward to her upcoming induction which will be at 37 weeks.  Prior CV Studies Reviewed: The following studies were reviewed today: None today  Past Medical History:  Diagnosis Date   Family history of  adverse reaction to anesthesia    mom hard to wake up   Herpes    Hypertension    PSC (primary sclerosing cholangitis)    Ulcerative colitis    Vaginal Pap smear, abnormal     Past Surgical History:  Procedure Laterality Date   BREAST LUMPECTOMY Left 1998   CERCLAGE REMOVAL     CERVICAL CERCLAGE N/A 12/28/2021   Procedure: CERCLAGE CERVICAL;  Surgeon: Maxie Better, MD;  Location: [facility] LD ORS;  Service: Gynecology;  Laterality: N/A;   CESAREAN SECTION     LEEP        OB History     Gravida  4   Para  2   Term      Preterm  2   AB  1   Living  0      SAB  1   IAB      Ectopic      Multiple  1   Live Births  3               Current Medications: Current Meds  Medication Sig   amLODipine (NORVASC) 10 MG tablet Take 1 tablet (10 mg total) by mouth daily.   aspirin 81 MG chewable tablet Chew 81 mg by mouth daily.   cholestyramine (QUESTRAN) 4 g packet Take by mouth as directed twice daily. (Patient taking differently: Take 4 g by mouth 2 (two)  times daily.)   folic acid (FOLVITE) 1 MG tablet Take 1 tablet by mouth every day .   mesalamine (LIALDA) 1.2 g EC tablet Take 2 tablets (2.4 g total) by mouth twice daily   metoprolol succinate (TOPROL-XL) 100 MG 24 hr tablet Take 1 tablet (100 mg total) by mouth daily for blood pressure.   Omega-3 Fatty Acids (FISH OIL) 1000 MG CAPS Take 1,000 mg by mouth daily.   Prenatal Vit-Fe Fumarate-FA (PRENATAL PO) Take 1 tablet by mouth daily.   ursodiol (ACTIGALL) 300 MG capsule Take 2 capsules by mouth 2 times a day.     Allergies:   Patient has no known allergies.   Social History   Socioeconomic History   Marital status: Married    Spouse name: Not on file   Number of children: Not on file   Years of education: Not on file   Highest education level: Not on file  Occupational History   Not on file  Tobacco Use   Smoking status: Never   Smokeless tobacco: Never  Vaping Use   Vaping Use: Never used   Substance and Sexual Activity   Alcohol use: Never   Drug use: Never   Sexual activity: Yes  Other Topics Concern   Not on file  Social History Narrative   Not on file   Social Determinants of Health   Financial Resource Strain: Not on file  Food Insecurity: No Food Insecurity (04/26/2022)   Hunger Vital Sign    Worried About Running Out of Food in the Last Year: Never true    Ran Out of Food in the Last Year: Never true  Transportation Needs: No Transportation Needs (04/26/2022)   PRAPARE - Administrator, Civil Service (Medical): No    Lack of Transportation (Non-Medical): No  Physical Activity: Not on file  Stress: Not on file  Social Connections: Not on file      Family History  Problem Relation Age of Onset   Healthy Mother    Cancer Mother        breast    Healthy Father    Healthy Brother    Allergic rhinitis Neg Hx    Angioedema Neg Hx    Asthma Neg Hx    Atopy Neg Hx    Eczema Neg Hx    Urticaria Neg Hx    Immunodeficiency Neg Hx       ROS:   Please see the history of present illness.     All other systems reviewed and are negative.   Labs/EKG Reviewed:    EKG:   EKG sinus rhythm,.   Recent Labs: 02/14/2022: Magnesium 2.0 04/26/2022: ALT 22; BUN 5; Creatinine, Ser 0.67; Hemoglobin 13.6; Platelets 304; Potassium 3.5; Sodium 136   Recent Lipid Panel No results found for: "CHOL", "TRIG", "HDL", "CHOLHDL", "LDLCALC", "LDLDIRECT"  Physical Exam:    VS:  BP 128/84   Pulse 74   Ht 5\' 7"  (1.702 m)   Wt 203 lb 6.4 oz (92.3 kg)   LMP 09/25/2021   SpO2 99%   BMI 31.86 kg/m     Wt Readings from Last 3 Encounters:  05/30/22 203 lb 6.4 oz (92.3 kg)  04/27/22 193 lb (87.5 kg)  03/31/22 192 lb (87.1 kg)     GEN:  Well nourished, well developed in no acute distress HEENT: Normal NECK: No JVD; No carotid bruits LYMPHATICS: No lymphadenopathy CARDIAC: RRR, no murmurs, rubs, gallops RESPIRATORY:  Clear to auscultation without rales,  wheezing or rhonchi  ABDOMEN: Soft, non-tender, non-distended MUSCULOSKELETAL:  No edema; No deformity  SKIN: Warm and dry NEUROLOGIC:  Alert and oriented x 3 PSYCHIATRIC:  Normal affect    Risk Assessment/Risk Calculators:           { Click for CHADS2VASc Score - THEN Refresh Note    :[id number]}      ASSESSMENT & PLAN:    Chronic hypertension in pregnancy -her blood pressure is within normal limits today,continue Amlodipine and Toprol-XL.   Plan for induction at 37 weeks.  I am excited for the patient.  Continue aspirin for preeclampsia prophylaxis  Plan to see her 4 weeks which will be postpartum virtually.  I have asked the patient to please notify me that she has been discharged at her postpartum delivery discharge.  There are no Patient Instructions on file for this visit.   Dispo:  No follow-ups on file.   Medication Adjustments/Labs and Tests Ordered: Current medicines are reviewed at length with the patient today.  Concerns regarding medicines are outlined above.  Tests Ordered: No orders of the defined types were placed in this encounter.  Medication Changes: No orders of the defined types were placed in this encounter.

## 2022-05-30 NOTE — Patient Instructions (Signed)
Medication Instructions:  Your physician recommends that you continue on your current medications as directed. Please refer to the Current Medication list given to you today.  *If you need a refill on your cardiac medications before your next appointment, please call your pharmacy*   Lab Work: None   Testing/Procedures: None   Follow-Up: At [facility], you and your health needs are our priority.  As part of our continuing mission to provide you with exceptional heart care, we have created designated Provider Care Teams.  These Care Teams include your primary Cardiologist (physician) and Advanced Practice Providers (APPs -  Physician Assistants and Nurse Practitioners) who all work together to provide you with the care you need, when you need it.  We recommend signing up for the patient portal called "MyChart".  Sign up information is provided on this After Visit Summary.  MyChart is used to connect with patients for Virtual Visits (Telemedicine).  Patients are able to view lab/test results, encounter notes, upcoming appointments, etc.  Non-urgent messages can be sent to your provider as well.   To learn more about what you can do with MyChart, go to [email or web address].    Your next appointment:   4 week(s)  Provider:   Thomasene Ripple, DO

## 2022-05-30 NOTE — Patient Instructions (Addendum)
Carletta Feasel  05/30/2022     Vic Blackbird  06/09/2022   Your procedure is scheduled on:  06/11/2022   Arrive at 1115 at Entrance C on [employer] at [facility]  and [employer]. You are invited to use the FREE valet parking or use the Visitor's parking deck.  Pick up the phone at the desk and dial [phone].  Call this number if you have problems the morning of surgery: [phone]  Remember:   Do not eat food:(After Midnight) Desps de medianoche.  Do not drink clear liquids: (4 Hours before arrival) 4 horas ante llegada.  Take these medicines the morning of surgery with A SIP OF WATER:  Norvasc. Valtrex, metoprolol, ursodiol, questran and lialda   Do not wear jewelry, make-up or nail polish.  Do not wear lotions, powders, or perfumes. Do not wear deodorant.  Do not shave 48 hours prior to surgery.  Do not bring valuables to the hospital.  [facility] is not   responsible for any belongings or valuables brought to the hospital.  Contacts, dentures or bridgework may not be worn into surgery.  Leave suitcase in the car. After surgery it may be brought to your room.  For patients admitted to the hospital, checkout time is 11:00 AM the day of              discharge.      Please read over the following fact sheets that you were given:     Preparing for Surgery

## 2022-06-02 ENCOUNTER — Other Ambulatory Visit: Payer: Self-pay

## 2022-06-02 ENCOUNTER — Other Ambulatory Visit (HOSPITAL_COMMUNITY): Payer: Self-pay

## 2022-06-02 MED ORDER — VALACYCLOVIR HCL 500 MG PO TABS
500.0000 mg | ORAL_TABLET | Freq: Two times a day (BID) | ORAL | 2 refills | Status: DC
  Filled 2022-06-02: qty 60, 30d supply, fill #0

## 2022-06-03 DIAGNOSIS — Z3483 Encounter for supervision of other normal pregnancy, third trimester: Secondary | ICD-10-CM | POA: Diagnosis not present

## 2022-06-03 DIAGNOSIS — Z3482 Encounter for supervision of other normal pregnancy, second trimester: Secondary | ICD-10-CM | POA: Diagnosis not present

## 2022-06-04 ENCOUNTER — Ambulatory Visit: Payer: Commercial Managed Care - PPO | Attending: Obstetrics and Gynecology

## 2022-06-04 ENCOUNTER — Ambulatory Visit: Payer: Commercial Managed Care - PPO | Admitting: *Deleted

## 2022-06-04 VITALS — BP 120/80 | HR 73

## 2022-06-04 DIAGNOSIS — O10013 Pre-existing essential hypertension complicating pregnancy, third trimester: Secondary | ICD-10-CM

## 2022-06-04 DIAGNOSIS — O10913 Unspecified pre-existing hypertension complicating pregnancy, third trimester: Secondary | ICD-10-CM

## 2022-06-04 DIAGNOSIS — O09213 Supervision of pregnancy with history of pre-term labor, third trimester: Secondary | ICD-10-CM | POA: Diagnosis not present

## 2022-06-04 DIAGNOSIS — O3413 Maternal care for benign tumor of corpus uteri, third trimester: Secondary | ICD-10-CM

## 2022-06-04 DIAGNOSIS — Z3A36 36 weeks gestation of pregnancy: Secondary | ICD-10-CM | POA: Diagnosis not present

## 2022-06-04 DIAGNOSIS — D259 Leiomyoma of uterus, unspecified: Secondary | ICD-10-CM | POA: Diagnosis not present

## 2022-06-04 DIAGNOSIS — O361930 Maternal care for other isoimmunization, third trimester, not applicable or unspecified: Secondary | ICD-10-CM

## 2022-06-04 DIAGNOSIS — O09523 Supervision of elderly multigravida, third trimester: Secondary | ICD-10-CM | POA: Insufficient documentation

## 2022-06-04 DIAGNOSIS — O34219 Maternal care for unspecified type scar from previous cesarean delivery: Secondary | ICD-10-CM

## 2022-06-04 DIAGNOSIS — O3433 Maternal care for cervical incompetence, third trimester: Secondary | ICD-10-CM | POA: Insufficient documentation

## 2022-06-09 ENCOUNTER — Other Ambulatory Visit (HOSPITAL_COMMUNITY)
Admission: RE | Admit: 2022-06-09 | Discharge: 2022-06-09 | Disposition: A | Discharge status: Home / Self Care | Payer: Commercial Managed Care - PPO | Source: Ambulatory Visit | Attending: Obstetrics and Gynecology | Admitting: Obstetrics and Gynecology

## 2022-06-09 DIAGNOSIS — Z01818 Encounter for other preprocedural examination: Secondary | ICD-10-CM | POA: Insufficient documentation

## 2022-06-09 LAB — TYPE AND SCREEN
ABO/RH(D): O POS
Antibody Screen: NEGATIVE

## 2022-06-09 LAB — CBC
HCT: 36.7 % (ref 36.0–46.0)
Hemoglobin: 12.6 g/dL (ref 12.0–15.0)
MCH: 31.6 pg (ref 26.0–34.0)
MCHC: 34.3 g/dL (ref 30.0–36.0)
MCV: 92 fL (ref 80.0–100.0)
Platelets: 313 10*3/uL (ref 150–400)
RBC: 3.99 MIL/uL (ref 3.87–5.11)
RDW: 13.3 % (ref 11.5–15.5)
WBC: 6.9 10*3/uL (ref 4.0–10.5)
nRBC: 0 % (ref 0.0–0.2)

## 2022-06-09 LAB — RPR: RPR Ser Ql: NONREACTIVE

## 2022-06-11 ENCOUNTER — Encounter (HOSPITAL_COMMUNITY): Payer: Self-pay | Admitting: Obstetrics and Gynecology

## 2022-06-11 ENCOUNTER — Inpatient Hospital Stay (HOSPITAL_COMMUNITY): Payer: Commercial Managed Care - PPO | Admitting: Certified Registered Nurse Anesthetist

## 2022-06-11 ENCOUNTER — Inpatient Hospital Stay (HOSPITAL_COMMUNITY)
Admission: AD | Admit: 2022-06-11 | Discharge: 2022-06-14 | DRG: 783 | Disposition: A | Discharge status: Home / Self Care | Payer: Commercial Managed Care - PPO | Attending: Obstetrics and Gynecology | Admitting: Obstetrics and Gynecology

## 2022-06-11 ENCOUNTER — Encounter (HOSPITAL_COMMUNITY)
Admission: AD | Disposition: A | Discharge status: Home / Self Care | Payer: Self-pay | Source: Home / Self Care | Attending: Obstetrics and Gynecology

## 2022-06-11 ENCOUNTER — Encounter: Payer: Self-pay | Admitting: Hematology and Oncology

## 2022-06-11 ENCOUNTER — Other Ambulatory Visit: Payer: Self-pay

## 2022-06-11 ENCOUNTER — Inpatient Hospital Stay (HOSPITAL_COMMUNITY)
Admission: RE | Admit: 2022-06-11 | Payer: Commercial Managed Care - PPO | Source: Ambulatory Visit | Admitting: Obstetrics and Gynecology

## 2022-06-11 DIAGNOSIS — O99214 Obesity complicating childbirth: Secondary | ICD-10-CM | POA: Diagnosis present

## 2022-06-11 DIAGNOSIS — Z79899 Other long term (current) drug therapy: Secondary | ICD-10-CM | POA: Diagnosis not present

## 2022-06-11 DIAGNOSIS — O34212 Maternal care for vertical scar from previous cesarean delivery: Secondary | ICD-10-CM | POA: Diagnosis not present

## 2022-06-11 DIAGNOSIS — O9902 Anemia complicating childbirth: Secondary | ICD-10-CM | POA: Diagnosis not present

## 2022-06-11 DIAGNOSIS — O9962 Diseases of the digestive system complicating childbirth: Secondary | ICD-10-CM | POA: Diagnosis not present

## 2022-06-11 DIAGNOSIS — O3433 Maternal care for cervical incompetence, third trimester: Secondary | ICD-10-CM | POA: Diagnosis present

## 2022-06-11 DIAGNOSIS — O164 Unspecified maternal hypertension, complicating childbirth: Secondary | ICD-10-CM

## 2022-06-11 DIAGNOSIS — O99824 Streptococcus B carrier state complicating childbirth: Secondary | ICD-10-CM | POA: Diagnosis present

## 2022-06-11 DIAGNOSIS — Z302 Encounter for sterilization: Secondary | ICD-10-CM | POA: Diagnosis not present

## 2022-06-11 DIAGNOSIS — O9832 Other infections with a predominantly sexual mode of transmission complicating childbirth: Secondary | ICD-10-CM | POA: Diagnosis not present

## 2022-06-11 DIAGNOSIS — O34219 Maternal care for unspecified type scar from previous cesarean delivery: Secondary | ICD-10-CM | POA: Diagnosis not present

## 2022-06-11 DIAGNOSIS — Z3A37 37 weeks gestation of pregnancy: Secondary | ICD-10-CM

## 2022-06-11 DIAGNOSIS — O1092 Unspecified pre-existing hypertension complicating childbirth: Secondary | ICD-10-CM | POA: Diagnosis not present

## 2022-06-11 DIAGNOSIS — Z01818 Encounter for other preprocedural examination: Secondary | ICD-10-CM

## 2022-06-11 DIAGNOSIS — O2662 Liver and biliary tract disorders in childbirth: Secondary | ICD-10-CM | POA: Diagnosis not present

## 2022-06-11 DIAGNOSIS — D649 Anemia, unspecified: Secondary | ICD-10-CM

## 2022-06-11 DIAGNOSIS — O10913 Unspecified pre-existing hypertension complicating pregnancy, third trimester: Secondary | ICD-10-CM | POA: Diagnosis not present

## 2022-06-11 DIAGNOSIS — A6000 Herpesviral infection of urogenital system, unspecified: Secondary | ICD-10-CM | POA: Diagnosis present

## 2022-06-11 DIAGNOSIS — K8301 Primary sclerosing cholangitis: Secondary | ICD-10-CM | POA: Diagnosis present

## 2022-06-11 DIAGNOSIS — K5190 Ulcerative colitis, unspecified, without complications: Secondary | ICD-10-CM | POA: Diagnosis present

## 2022-06-11 SURGERY — Surgical Case
Anesthesia: Regional | Site: Abdomen

## 2022-06-11 MED ORDER — OXYCODONE HCL 5 MG PO TABS
5.0000 mg | ORAL_TABLET | ORAL | Status: DC | PRN
  Administered 2022-06-12 (×3): 5 mg via ORAL
  Administered 2022-06-13: 10 mg via ORAL
  Administered 2022-06-13 – 2022-06-14 (×2): 5 mg via ORAL
  Filled 2022-06-11 (×6): qty 1
  Filled 2022-06-11: qty 2

## 2022-06-11 MED ORDER — ZOLPIDEM TARTRATE 5 MG PO TABS: 5.0000 mg | ORAL_TABLET | Freq: Every evening | ORAL | Status: DC | PRN

## 2022-06-11 MED ORDER — LACTATED RINGERS IV SOLN: INTRAVENOUS | Status: DC

## 2022-06-11 MED ORDER — FENTANYL CITRATE (PF) 100 MCG/2ML IJ SOLN
INTRAMUSCULAR | Status: AC
  Filled 2022-06-11: qty 2

## 2022-06-11 MED ORDER — ONDANSETRON HCL 4 MG/2ML IJ SOLN
INTRAMUSCULAR | Status: DC | PRN
  Administered 2022-06-11: 4 mg via INTRAVENOUS

## 2022-06-11 MED ORDER — SIMETHICONE 80 MG PO CHEW: 80.0000 mg | CHEWABLE_TABLET | ORAL | Status: DC | PRN

## 2022-06-11 MED ORDER — PHENYLEPHRINE HCL-NACL 20-0.9 MG/250ML-% IV SOLN
INTRAVENOUS | Status: AC
  Filled 2022-06-11: qty 250

## 2022-06-11 MED ORDER — SODIUM CHLORIDE 0.9% FLUSH: 3.0000 mL | INTRAVENOUS | Status: DC | PRN

## 2022-06-11 MED ORDER — DEXAMETHASONE SODIUM PHOSPHATE 4 MG/ML IJ SOLN
INTRAMUSCULAR | Status: AC
  Filled 2022-06-11: qty 1

## 2022-06-11 MED ORDER — CEFAZOLIN SODIUM-DEXTROSE 2-4 GM/100ML-% IV SOLN
INTRAVENOUS | Status: AC
  Filled 2022-06-11: qty 100

## 2022-06-11 MED ORDER — OXYTOCIN-SODIUM CHLORIDE 30-0.9 UT/500ML-% IV SOLN
INTRAVENOUS | Status: DC | PRN
  Administered 2022-06-11: 300 mL via INTRAVENOUS

## 2022-06-11 MED ORDER — ACETAMINOPHEN 10 MG/ML IV SOLN
INTRAVENOUS | Status: DC | PRN
  Administered 2022-06-11: 1000 mg via INTRAVENOUS

## 2022-06-11 MED ORDER — STERILE WATER FOR IRRIGATION IR SOLN
Status: DC | PRN
  Administered 2022-06-11: 1000 mL

## 2022-06-11 MED ORDER — DIPHENHYDRAMINE HCL 50 MG/ML IJ SOLN: 12.5000 mg | INTRAMUSCULAR | Status: DC | PRN

## 2022-06-11 MED ORDER — SOD CITRATE-CITRIC ACID 500-334 MG/5ML PO SOLN
30.0000 mL | ORAL | Status: AC
  Administered 2022-06-11: 30 mL via ORAL

## 2022-06-11 MED ORDER — DIPHENHYDRAMINE HCL 25 MG PO CAPS: 25.0000 mg | ORAL_CAPSULE | Freq: Four times a day (QID) | ORAL | Status: DC | PRN

## 2022-06-11 MED ORDER — SCOPOLAMINE 1 MG/3DAYS TD PT72
1.0000 | MEDICATED_PATCH | Freq: Once | TRANSDERMAL | Status: AC
  Administered 2022-06-11: 1.5 mg via TRANSDERMAL

## 2022-06-11 MED ORDER — ONDANSETRON HCL 4 MG/2ML IJ SOLN
INTRAMUSCULAR | Status: AC
  Filled 2022-06-11: qty 2

## 2022-06-11 MED ORDER — SODIUM CHLORIDE 0.9 % IR SOLN
Status: DC | PRN
  Administered 2022-06-11: 1
  Administered 2022-06-11: 1000 mL

## 2022-06-11 MED ORDER — SCOPOLAMINE 1 MG/3DAYS TD PT72
MEDICATED_PATCH | TRANSDERMAL | Status: AC
  Filled 2022-06-11: qty 1

## 2022-06-11 MED ORDER — AMLODIPINE BESYLATE 10 MG PO TABS
10.0000 mg | ORAL_TABLET | Freq: Every day | ORAL | Status: DC
  Administered 2022-06-12 – 2022-06-14 (×3): 10 mg via ORAL
  Filled 2022-06-11 (×3): qty 2
  Filled 2022-06-11 (×3): qty 1
  Filled 2022-06-11: qty 2

## 2022-06-11 MED ORDER — SOD CITRATE-CITRIC ACID 500-334 MG/5ML PO SOLN
ORAL | Status: AC
  Filled 2022-06-11: qty 30

## 2022-06-11 MED ORDER — KETOROLAC TROMETHAMINE 30 MG/ML IJ SOLN
INTRAMUSCULAR | Status: AC
  Filled 2022-06-11: qty 1

## 2022-06-11 MED ORDER — IBUPROFEN 600 MG PO TABS: 600.0000 mg | ORAL_TABLET | Freq: Four times a day (QID) | ORAL | Status: DC

## 2022-06-11 MED ORDER — NALOXONE HCL 4 MG/10ML IJ SOLN: 1.0000 ug/kg/h | INTRAVENOUS | Status: DC | PRN

## 2022-06-11 MED ORDER — DEXAMETHASONE SODIUM PHOSPHATE 10 MG/ML IJ SOLN
INTRAMUSCULAR | Status: DC | PRN
  Administered 2022-06-11: 4 mg via INTRAVENOUS

## 2022-06-11 MED ORDER — ONDANSETRON HCL 4 MG/2ML IJ SOLN: 4.0000 mg | Freq: Once | INTRAMUSCULAR | Status: DC | PRN

## 2022-06-11 MED ORDER — BUPIVACAINE HCL (PF) 0.25 % IJ SOLN
INTRAMUSCULAR | Status: DC | PRN
  Administered 2022-06-11: 10 mL

## 2022-06-11 MED ORDER — OXYCODONE HCL 5 MG PO TABS: 5.0000 mg | ORAL_TABLET | Freq: Once | ORAL | Status: DC | PRN

## 2022-06-11 MED ORDER — NALOXONE HCL 0.4 MG/ML IJ SOLN: 0.4000 mg | INTRAMUSCULAR | Status: DC | PRN

## 2022-06-11 MED ORDER — DIBUCAINE (PERIANAL) 1 % EX OINT: 1.0000 | TOPICAL_OINTMENT | CUTANEOUS | Status: DC | PRN

## 2022-06-11 MED ORDER — BUPIVACAINE HCL (PF) 0.25 % IJ SOLN
INTRAMUSCULAR | Status: AC
  Filled 2022-06-11: qty 30

## 2022-06-11 MED ORDER — SIMETHICONE 80 MG PO CHEW
80.0000 mg | CHEWABLE_TABLET | Freq: Three times a day (TID) | ORAL | Status: DC
  Administered 2022-06-12 – 2022-06-14 (×8): 80 mg via ORAL
  Filled 2022-06-11 (×9): qty 1

## 2022-06-11 MED ORDER — URSODIOL 300 MG PO CAPS
600.0000 mg | ORAL_CAPSULE | Freq: Two times a day (BID) | ORAL | Status: DC
  Administered 2022-06-11 – 2022-06-14 (×7): 600 mg via ORAL
  Filled 2022-06-11 (×8): qty 2

## 2022-06-11 MED ORDER — KETOROLAC TROMETHAMINE 30 MG/ML IJ SOLN
30.0000 mg | Freq: Once | INTRAMUSCULAR | Status: AC | PRN
  Administered 2022-06-11: 30 mg via INTRAVENOUS

## 2022-06-11 MED ORDER — OXYCODONE HCL 5 MG/5ML PO SOLN: 5.0000 mg | Freq: Once | ORAL | Status: DC | PRN

## 2022-06-11 MED ORDER — PHENYLEPHRINE HCL-NACL 20-0.9 MG/250ML-% IV SOLN
INTRAVENOUS | Status: DC | PRN
  Administered 2022-06-11: 30 ug/min via INTRAVENOUS

## 2022-06-11 MED ORDER — ONDANSETRON HCL 4 MG/2ML IJ SOLN: 4.0000 mg | Freq: Three times a day (TID) | INTRAMUSCULAR | Status: DC | PRN

## 2022-06-11 MED ORDER — ACETAMINOPHEN 500 MG PO TABS
1000.0000 mg | ORAL_TABLET | Freq: Four times a day (QID) | ORAL | Status: DC
  Administered 2022-06-12 – 2022-06-14 (×10): 1000 mg via ORAL
  Filled 2022-06-11 (×11): qty 2

## 2022-06-11 MED ORDER — FOLIC ACID 1 MG PO TABS
1.0000 mg | ORAL_TABLET | Freq: Every day | ORAL | Status: DC
  Administered 2022-06-12 – 2022-06-14 (×3): 1 mg via ORAL
  Filled 2022-06-11 (×4): qty 1

## 2022-06-11 MED ORDER — OXYTOCIN-SODIUM CHLORIDE 30-0.9 UT/500ML-% IV SOLN
2.5000 [IU]/h | INTRAVENOUS | Status: AC
  Administered 2022-06-11: 2.5 [IU]/h via INTRAVENOUS
  Filled 2022-06-11 (×2): qty 500

## 2022-06-11 MED ORDER — DIPHENHYDRAMINE HCL 25 MG PO CAPS: 25.0000 mg | ORAL_CAPSULE | ORAL | Status: DC | PRN

## 2022-06-11 MED ORDER — METOPROLOL SUCCINATE ER 100 MG PO TB24
100.0000 mg | ORAL_TABLET | Freq: Every day | ORAL | Status: DC
  Administered 2022-06-12 – 2022-06-14 (×3): 100 mg via ORAL
  Filled 2022-06-11 (×3): qty 1

## 2022-06-11 MED ORDER — BUPIVACAINE IN DEXTROSE 0.75-8.25 % IT SOLN
INTRATHECAL | Status: DC | PRN
  Administered 2022-06-11: 13.5 mg via INTRATHECAL

## 2022-06-11 MED ORDER — FENTANYL CITRATE (PF) 100 MCG/2ML IJ SOLN
INTRAMUSCULAR | Status: DC | PRN
  Administered 2022-06-11: 15 ug via INTRATHECAL

## 2022-06-11 MED ORDER — HYDROMORPHONE HCL 1 MG/ML IJ SOLN: 0.2500 mg | INTRAMUSCULAR | Status: DC | PRN

## 2022-06-11 MED ORDER — SENNOSIDES-DOCUSATE SODIUM 8.6-50 MG PO TABS
2.0000 | ORAL_TABLET | ORAL | Status: DC
  Administered 2022-06-12 – 2022-06-14 (×2): 2 via ORAL
  Filled 2022-06-11 (×2): qty 2

## 2022-06-11 MED ORDER — PRENATAL MULTIVITAMIN CH
1.0000 | ORAL_TABLET | Freq: Every day | ORAL | Status: DC
  Administered 2022-06-12 – 2022-06-14 (×2): 1 via ORAL
  Filled 2022-06-11 (×2): qty 1

## 2022-06-11 MED ORDER — MENTHOL 3 MG MT LOZG: 1.0000 | LOZENGE | OROMUCOSAL | Status: DC | PRN

## 2022-06-11 MED ORDER — CEFAZOLIN SODIUM-DEXTROSE 2-4 GM/100ML-% IV SOLN
2.0000 g | INTRAVENOUS | Status: AC
  Administered 2022-06-11: 2 g via INTRAVENOUS

## 2022-06-11 MED ORDER — COCONUT OIL OIL: 1.0000 | TOPICAL_OIL | Status: DC | PRN

## 2022-06-11 MED ORDER — CHOLESTYRAMINE 4 G PO PACK
4.0000 g | PACK | Freq: Two times a day (BID) | ORAL | Status: DC
  Administered 2022-06-11 – 2022-06-14 (×6): 4 g via ORAL
  Filled 2022-06-11 (×7): qty 1

## 2022-06-11 MED ORDER — WITCH HAZEL-GLYCERIN EX PADS: 1.0000 | MEDICATED_PAD | CUTANEOUS | Status: DC | PRN

## 2022-06-11 MED ORDER — POVIDONE-IODINE 10 % EX SWAB
2.0000 | Freq: Once | CUTANEOUS | Status: AC
  Administered 2022-06-11: 2 via TOPICAL

## 2022-06-11 MED ORDER — MORPHINE SULFATE (PF) 0.5 MG/ML IJ SOLN
INTRAMUSCULAR | Status: AC
  Filled 2022-06-11: qty 10

## 2022-06-11 MED ORDER — MEPERIDINE HCL 25 MG/ML IJ SOLN: 6.2500 mg | INTRAMUSCULAR | Status: DC | PRN

## 2022-06-11 MED ORDER — MESALAMINE 1.2 G PO TBEC
2.4000 g | DELAYED_RELEASE_TABLET | Freq: Two times a day (BID) | ORAL | Status: DC
  Administered 2022-06-11 – 2022-06-14 (×7): 2.4 g via ORAL
  Filled 2022-06-11 (×9): qty 2

## 2022-06-11 MED ORDER — MORPHINE SULFATE (PF) 0.5 MG/ML IJ SOLN
INTRAMUSCULAR | Status: DC | PRN
  Administered 2022-06-11: 150 ug via INTRATHECAL

## 2022-06-11 SURGICAL SUPPLY — 43 items
APL SKNCLS STERI-STRIP NONHPOA (GAUZE/BANDAGES/DRESSINGS) ×1
BARRIER ADHS 3X4 INTERCEED (GAUZE/BANDAGES/DRESSINGS) ×1 IMPLANT
BENZOIN TINCTURE PRP APPL 2/3 (GAUZE/BANDAGES/DRESSINGS) IMPLANT
BRR ADH 4X3 ABS CNTRL BYND (GAUZE/BANDAGES/DRESSINGS) ×2
CLAMP UMBILICAL CORD (MISCELLANEOUS) IMPLANT
CLOTH BEACON ORANGE TIMEOUT ST (SAFETY) ×1 IMPLANT
DRAPE C SECTION CLR SCREEN (DRAPES) ×1 IMPLANT
DRSG OPSITE POSTOP 4X10 (GAUZE/BANDAGES/DRESSINGS) ×1 IMPLANT
ELECT REM PT RETURN 9FT ADLT (ELECTROSURGICAL) ×1
ELECTRODE REM PT RTRN 9FT ADLT (ELECTROSURGICAL) ×1 IMPLANT
EXTRACTOR VACUUM M CUP 4 TUBE (SUCTIONS) IMPLANT
GAUZE SPONGE 4X4 12PLY STRL LF (GAUZE/BANDAGES/DRESSINGS) IMPLANT
GLOVE BIOGEL PI IND STRL 7.0 (GLOVE) ×2 IMPLANT
GLOVE ECLIPSE 6.5 STRL STRAW (GLOVE) ×1 IMPLANT
GOWN STRL REUS W/TWL LRG LVL3 (GOWN DISPOSABLE) ×2 IMPLANT
KIT ABG SYR 3ML LUER SLIP (SYRINGE) IMPLANT
NDL HYPO 25X5/8 SAFETYGLIDE (NEEDLE) IMPLANT
NEEDLE HYPO 22GX1.5 SAFETY (NEEDLE) ×1 IMPLANT
NEEDLE HYPO 25X5/8 SAFETYGLIDE (NEEDLE) IMPLANT
NS IRRIG 1000ML POUR BTL (IV SOLUTION) ×1 IMPLANT
PACK C SECTION WH (CUSTOM PROCEDURE TRAY) ×1 IMPLANT
PAD OB MATERNITY 4.3X12.25 (PERSONAL CARE ITEMS) ×1 IMPLANT
RTRCTR C-SECT PINK 25CM LRG (MISCELLANEOUS) IMPLANT
STRIP CLOSURE SKIN 1/2X4 (GAUZE/BANDAGES/DRESSINGS) IMPLANT
SUT CHROMIC GUT AB #0 18 (SUTURE) IMPLANT
SUT MNCRL 0 VIOLET CTX 36 (SUTURE) ×3 IMPLANT
SUT MON AB 2-0 SH 27 (SUTURE)
SUT MON AB 2-0 SH27 (SUTURE) IMPLANT
SUT MON AB 3-0 SH 27 (SUTURE)
SUT MON AB 3-0 SH27 (SUTURE) IMPLANT
SUT MON AB 4-0 PS1 27 (SUTURE) IMPLANT
SUT MONOCRYL 0 CTX 36 (SUTURE) ×3
SUT PLAIN 2 0 (SUTURE)
SUT PLAIN 2 0 XLH (SUTURE) IMPLANT
SUT PLAIN ABS 2-0 CT1 27XMFL (SUTURE) IMPLANT
SUT VIC AB 0 CT1 36 (SUTURE) ×2 IMPLANT
SUT VIC AB 2-0 CT1 27 (SUTURE) ×1
SUT VIC AB 2-0 CT1 TAPERPNT 27 (SUTURE) ×1 IMPLANT
SUT VIC AB 4-0 PS2 27 (SUTURE) IMPLANT
SYR CONTROL 10ML LL (SYRINGE) ×1 IMPLANT
TOWEL OR 17X24 6PK STRL BLUE (TOWEL DISPOSABLE) ×1 IMPLANT
TRAY FOLEY W/BAG SLVR 14FR LF (SET/KITS/TRAYS/PACK) IMPLANT
WATER STERILE IRR 1000ML POUR (IV SOLUTION) ×1 IMPLANT

## 2022-06-11 NOTE — H&P (Signed)
Jamie Duarte is Jamie 42 y.o. female presenting @ 37 wk for repeat C/S, BTL, cervical cerclage removal due to previous Classical Cesarean section, desires sterilization and cervical incompetence. PMhx notable for PSC, ulcerative colitis, chronic HTN, previous PTB x 2, SSA (+) antibody. OB History     Gravida  4   Para  2   Term      Preterm  2   AB  1   Living  0      SAB  1   IAB      Ectopic      Multiple  1   Live Births  3          Past Medical History:  Diagnosis Date   Family history of adverse reaction to anesthesia    mom hard to wake up   Herpes    Hypertension    PSC (primary sclerosing cholangitis)    Ulcerative colitis    Vaginal Pap smear, abnormal    Past Surgical History:  Procedure Laterality Date   BREAST LUMPECTOMY Left 1998   CERCLAGE REMOVAL     CERVICAL CERCLAGE N/Jamie 12/28/2021   Procedure: CERCLAGE CERVICAL;  Surgeon: Jamie Better, MD;  Location: [facility] LD ORS;  Service: Gynecology;  Laterality: N/Jamie;   CESAREAN SECTION     LEEP     Family History: family history includes Cancer in her mother; Healthy in her brother, father, and mother; Hypertension in her father and mother. Social History:  reports that she has never smoked. She has never used smokeless tobacco. She reports that she does not drink alcohol and does not use drugs.     Maternal Diabetes: No Genetic Screening: Normal Maternal Ultrasounds/Referrals: Normal Fetal Ultrasounds or other Referrals:  Fetal echo, Referred to Materal Fetal Medicine  Maternal Substance Abuse:  No Significant Maternal Medications:  Meds include: Other:  actigall, melasamine, amlodipine, topral, valtrex, baby ASA Significant Maternal Lab Results:  Group B Strep positive Number of Prenatal Visits:greater than 3 verified prenatal visits Other Comments:   chronic HTN, AMA. Previous Classical C/S, cervical incompetence Primary sclerosing cholangitis, ulcerative colitis (+) SSA antibody. PTB x 2  with neonatal demise Review of Systems  All other systems reviewed and are negative.  History   Blood pressure 125/78, pulse 74, temperature 98.2 F (36.8 C), temperature source Oral, resp. rate 20, height  (1.702 m), weight 92.5 kg, last menstrual period 09/25/2021. Maternal Exam:  Introitus: Normal vulva.   Physical Exam Constitutional:      Appearance: Normal appearance.  Eyes:     Extraocular Movements: Extraocular movements intact.  Cardiovascular:     Rate and Rhythm: Regular rhythm.     Heart sounds: Normal heart sounds.  Pulmonary:     Breath sounds: Normal breath sounds.  Abdominal:     Comments: Gravid soft. Transverse incision  Genitourinary:    General: Normal vulva.  Musculoskeletal:        General: Normal range of motion.     Cervical back: Neck supple.  Skin:    General: Skin is warm and dry.  Neurological:     General: No focal deficit present.     Mental Status: She is alert and oriented to person, place, and time.  Psychiatric:        Mood and Affect: Mood normal.        Behavior: Behavior normal.     Prenatal labs: ABO, Rh: --/--/O POS (04/22 1135) Antibody: NEG (04/22 1135) Rubella: Immune (10/17 0000)  RPR: NON REACTIVE (04/22 1200)  HBsAg: Negative (10/17 0000)  HIV: Non-reactive (10/17 0000)  GBS:   positive  Assessment/Plan: Previous Classical section Chronic HTN  Cervical incompetence Desires sterilization PSC, UC.  AMA IUP @ 37 wk P) repeat C/S, TL. Cervical cerclage removal. Procedures explained. Risk of surgery reviewed including infection, bleeding, possible need for blood transfusion and its risk, injury to bladder, bowel, ureter, internal scar tissue, permanent sterilization, failure rate 1/300, non reversible.  Distortion of cervix from cerclage removal. All ? answered   Jamie Duarte Jamie Duarte 06/11/2022, 5:39 PM

## 2022-06-11 NOTE — Anesthesia Preprocedure Evaluation (Addendum)
Anesthesia Evaluation  Patient identified by MRN, date of birth, ID band Patient awake    Reviewed: Allergy & Precautions, NPO status , Patient's Chart, lab work & pertinent test results, reviewed documented beta blocker date and time   History of Anesthesia Complications (+) Family history of anesthesia reaction  Airway Mallampati: II  TM Distance: >3 FB Neck ROM: Full    Dental no notable dental hx. (+) Teeth Intact, Dental Advisory Given   Pulmonary neg pulmonary ROS   Pulmonary exam normal breath sounds clear to auscultation       Cardiovascular hypertension, Pt. on medications and Pt. on home beta blockers Normal cardiovascular exam Rhythm:Regular Rate:Normal  Hx/o cHTN   Neuro/Psych negative neurological ROS  negative psych ROS   GI/Hepatic PUD,,,Primary Sclerosing cholangitis on Actigal Hx/o ulcerative colitis   Endo/Other  Obesity  Renal/GU negative Renal ROS  negative genitourinary   Musculoskeletal  (+) Arthritis , Osteoarthritis,    Abdominal  (+) + obese  Peds  Hematology  (+) Blood dyscrasia, anemia Lab Results      Component                Value               Date                      WBC                      6.9                 06/09/2022                HGB                      12.6                06/09/2022                HCT                      36.7                06/09/2022                MCV                      92.0                06/09/2022                PLT                      313                 06/09/2022      T&S available         ANA +    Anesthesia Other Findings   Reproductive/Obstetrics (+) Pregnancy Hx/o cervical cerclage Hx/o previous Classical C/Section                              Anesthesia Physical Anesthesia Plan  ASA: 3  Anesthesia Plan: Spinal   Post-op Pain Management: Minimal or no pain anticipated   Induction:  Intravenous  PONV Risk Score and Plan: 3 and Treatment may vary due to age or medical  condition  Airway Management Planned: Natural Airway  Additional Equipment:   Intra-op Plan:   Post-operative Plan:   Informed Consent: I have reviewed the patients History and Physical, chart, labs and discussed the procedure including the risks, benefits and alternatives for the proposed anesthesia with the patient or authorized representative who has indicated his/her understanding and acceptance.     Dental advisory given  Plan Discussed with: Anesthesiologist  Anesthesia Plan Comments: (37 wk G4P2 for Primary c/s w  BTL)        Anesthesia Quick Evaluation

## 2022-06-11 NOTE — Anesthesia Procedure Notes (Signed)
Spinal  Patient location during procedure: OB Start time: 06/11/2022 5:44 PM End time: 06/11/2022 5:47 PM Reason for block: surgical anesthesia Staffing Performed: anesthesiologist  Anesthesiologist: Trevor Iha, MD Performed by: Trevor Iha, MD Authorized by: Trevor Iha, MD   Preanesthetic Checklist Completed: patient identified, IV checked, risks and benefits discussed, surgical consent, monitors and equipment checked, pre-op evaluation and timeout performed Spinal Block Patient position: sitting Prep: DuraPrep and site prepped and draped Patient monitoring: heart rate, cardiac monitor, continuous pulse ox and blood pressure Approach: midline Location: L3-4 Injection technique: single-shot Needle Needle type: Pencan  Needle gauge: 24 G Needle length: 9 cm Needle insertion depth: 7 cm Assessment Sensory level: T4 Events: CSF return Additional Notes  1 Attempt (s). Pt tolerated procedure well.

## 2022-06-11 NOTE — Anesthesia Postprocedure Evaluation (Signed)
Anesthesia Post Note  Patient: Jamie Duarte  Procedure(s) Performed: CESAREAN SECTION WITH BILATERAL TUBAL LIGATION (Abdomen)     Patient location during evaluation: PACU Anesthesia Type: Spinal Level of consciousness: oriented and awake and alert Pain management: pain level controlled Vital Signs Assessment: post-procedure vital signs reviewed and stable Respiratory status: spontaneous breathing, respiratory function stable and nonlabored ventilation Cardiovascular status: blood pressure returned to baseline and stable Postop Assessment: no headache, no backache, no apparent nausea or vomiting, spinal receding and patient able to bend at knees Anesthetic complications: no   No notable events documented.  Last Vitals:  Vitals:   06/11/22 2030 06/11/22 2045  BP: (!) 137/92   Pulse: 67 76  Resp: 16 13  Temp: 37.3 C   SpO2: 100% 99%    Last Pain:  Vitals:   06/11/22 2030  TempSrc: Axillary  PainSc: 4    Pain Goal:    LLE Motor Response: Purposeful movement (06/11/22 2030) LLE Sensation: Increased (06/11/22 2030) RLE Motor Response: Purposeful movement (06/11/22 2030) RLE Sensation: Increased (06/11/22 2030)     Epidural/Spinal Function Cutaneous sensation: Able to Discern Pressure (06/11/22 2030), Patient able to flex knees: Yes (06/11/22 2030), Patient able to lift hips off bed: No (06/11/22 2030), Back pain beyond tenderness at insertion site: No (06/11/22 2030), Progressively worsening motor and/or sensory loss: No (06/11/22 2030), Bowel and/or bladder incontinence post epidural: No (06/11/22 2030)  [name] A.

## 2022-06-11 NOTE — Brief Op Note (Signed)
06/11/2022  8:36 PM  PATIENT:  Jamie Duarte  42 y.o. female  PRE-OPERATIVE DIAGNOSIS:  Previous Classical cesarean section, cervical incompetence, cerclage in place, desire Sterilization, AMA. Chronic HTN, IUP @ 37 wk  POST-OPERATIVE DIAGNOSIS: same  PROCEDURE:  Repeat Cesarean section, Sharl Ma hysterotomy, cervical cerclage removal, Modified pomeroy tubal ligation  SURGEON:  Surgeon(s) and Role:    * Maxie Better, MD - Primary  PHYSICIAN ASSISTANT:   ASSISTANTS: none   ANESTHESIA:   spinal FINDINGS; live female nl tube and ovaries. Cerclage suture at 12 o clock. Post removal cervix 1 cm dilated.  Uterine fibroids  EBL:  799   BLOOD ADMINISTERED:none  DRAINS: none   LOCAL MEDICATIONS USED:  MARCAINE     SPECIMEN:  Source of Specimen:  portion of right and left fallopian tube  DISPOSITION OF SPECIMEN:  PATHOLOGY  COUNTS:  YES  TOURNIQUET:  * No tourniquets in log *  DICTATION: .Other Dictation: Dictation Number [id number]  PLAN OF CARE: Admit to inpatient   PATIENT DISPOSITION:  PACU - hemodynamically stable.   Delay start of Pharmacological VTE agent (>24hrs) due to surgical blood loss or risk of bleeding: no

## 2022-06-11 NOTE — Transfer of Care (Signed)
Immediate Anesthesia Transfer of Care Note  Patient: Jamie Duarte  Procedure(s) Performed: CESAREAN SECTION WITH BILATERAL TUBAL LIGATION (Abdomen)  Patient Location: PACU  Anesthesia Type:Spinal  Level of Consciousness: awake, alert , and oriented  Airway & Oxygen Therapy: Patient Spontanous Breathing  Post-op Assessment: Report given to RN and Post -op Vital signs reviewed and stable  Post vital signs: Reviewed  Last Vitals:  Vitals Value Taken Time  BP 132/57 06/11/22 1957  Temp    Pulse 68 06/11/22 1959  Resp 11 06/11/22 1959  SpO2 100 % 06/11/22 1959  Vitals shown include unvalidated device data.  Last Pain:  Vitals:   06/11/22 1606  TempSrc: Oral  PainSc:          Complications: No notable events documented.

## 2022-06-12 ENCOUNTER — Encounter (HOSPITAL_COMMUNITY): Payer: Self-pay | Admitting: Obstetrics and Gynecology

## 2022-06-12 LAB — CBC
HCT: 32.1 % — ABNORMAL LOW (ref 36.0–46.0)
Hemoglobin: 11.2 g/dL — ABNORMAL LOW (ref 12.0–15.0)
MCH: 31.5 pg (ref 26.0–34.0)
MCHC: 34.9 g/dL (ref 30.0–36.0)
MCV: 90.2 fL (ref 80.0–100.0)
Platelets: 262 10*3/uL (ref 150–400)
RBC: 3.56 MIL/uL — ABNORMAL LOW (ref 3.87–5.11)
RDW: 13.1 % (ref 11.5–15.5)
WBC: 13.2 10*3/uL — ABNORMAL HIGH (ref 4.0–10.5)
nRBC: 0 % (ref 0.0–0.2)

## 2022-06-12 NOTE — Progress Notes (Signed)
SVD: repeat  S:  Pt reports feeling well tolerating reg diet/ Tolerating po/ Voiding without problems/ No n/v/ Bleeding is light/ Pain controlled withprescription NSAID's including ibuprofen (Motrin) and narcotic analgesics including oxycodone (Oxycontin, Oxyir)    O:  A & O x 3 / VS: Blood pressure (!) 140/89, pulse 79, temperature 98.5 F (36.9 C), temperature source Oral, resp. rate 18, height 5\' 7"  (1.702 m), weight 92.5 kg, last menstrual period 09/25/2021, SpO2 100 %, unknown if currently breastfeeding.  LABS:  Results for orders placed or performed during the hospital encounter of 06/11/22 (from the past 24 hour(s))  CBC     Status: Abnormal   Collection Time: 06/12/22  5:34 AM  Result Value Ref Range   WBC 13.2 (H) 4.0 - 10.5 K/uL   RBC 3.56 (L) 3.87 - 5.11 MIL/uL   Hemoglobin 11.2 (L) 12.0 - 15.0 g/dL   HCT 40.9 (L) 81.1 - 91.4 %   MCV 90.2 80.0 - 100.0 fL   MCH 31.5 26.0 - 34.0 pg   MCHC 34.9 30.0 - 36.0 g/dL   RDW 78.2 95.6 - 21.3 %   Platelets 262 150 - 400 K/uL   nRBC 0.0 0.0 - 0.2 %    I&O: I/O last 3 completed shifts: In: 2650 [P.O.:1250; I.V.:1200; IV Piggyback:200] Out: 2249 [Urine:1450; Blood:799]   Total I/O In: 1041.3 [I.V.:1041.3] Out: 1100 [Urine:1100]  Lungs: chest to percussion  Heart: RRR  Abdomen: soft slightly distended. Uterus firm @ umb  Perineum: not inspected  Lochia: light  Extremities:no redness or tenderness in the calves or thighs, no edema    A/P: POD # 1/PPD # 1/ [id number] Chronic HTN on medication PSC on med  UC on med  Doing well  Continue routine post partum orders

## 2022-06-12 NOTE — Lactation Note (Signed)
This note was copied from a baby's chart. Lactation Consultation Note  Patient Name: Girl Mardee Clune ZOXWR'U Date: 06/12/2022 Age:44 hours - 8 % weight loss  Reason for consult: Initial assessment;Early term 37-38.6wks LC offered to assist , and mom receptive. Baby very sleepy and LC showed waking techniques, and still wouldn't latch.  Mom aware of 24 hours BF goals of feed with feeding cues and by 3 hours. Noted areola dryness , and mom is going to use her own nipple cream , and all natural ingredients , a dab a due and mom aware.  LC showed mom how to use the reverse pressure exercise.  Call with stronger feeding cues for latch assessment.   LC reviewed the doc flow sheets with parents and confirmed I/O's   Maternal Data Has patient been taught Hand Expression?: Yes  Feeding Mother's Current Feeding Choice: Breast Milk  LATCH Score Latch: Too sleepy or reluctant, no latch achieved, no sucking elicited. (Baby sleepy)  Audible Swallowing: None  Type of Nipple: Everted at rest and after stimulation  Comfort (Breast/Nipple): Soft / non-tender  Hold (Positioning): Assistance needed to correctly position infant at breast and maintain latch.  LATCH Score: 5   Interventions Interventions: Breast feeding basics reviewed;Assisted with latch;Skin to skin;Breast massage;Hand express;Reverse pressure;Adjust position;Support pillows;Education;LC Services brochure  Discharge  Per mom has DEBP - [employer]   Consult Status Consult Status: Follow-up Date: 06/12/22 Follow-up type: In-patient    Matilde Sprang Aldean Pipe 06/12/2022, 10:11 AM

## 2022-06-12 NOTE — Op Note (Unsigned)
NAME: Jamie Duarte, Jamie Duarte MEDICAL RECORD NO: [id number] ACCOUNT NO: [id number] DATE OF BIRTH: 09/25/1980 FACILITY: [facility] LOCATION: [facility] PHYSICIAN: Jakari Jacot A. Cherly Hensen, MD  Operative Report   DATE OF PROCEDURE: 06/11/2022  PREOPERATIVE DIAGNOSES:  Previous classical cesarean section, desires sterilization. cervical incompetence, cerclage in place, chronic hypertension, advanced maternal age, intrauterine gestation at 37 weeks   PROCEDURE:  Repeat cesarean section, Sharl Ma hysterotomy, modified Pomeroy tubal ligation, cervical cerclage removal.  POSTOPERATIVE DIAGNOSES:  Previous classical cesarean section, desires sterilization, cerclage in place,cervical incompetence, chronic hypertension, advanced maternal age, intrauterine gestation at 51 weeks  ANESTHESIA:  Spinal.  SURGEON:  Maxie Better, MD.  ASSISTANT:  None.  DESCRIPTION OF PROCEDURE:  Under adequate spinal anesthesia, the patient was placed in the dorsal lithotomy position.  A sterile speculum was placed in the vagina.  The cerclage was noted to be at the 12 o'clock position and the knot was cut and the  cerclage removed along with the tag that was it.  Bleeding was noted from the site that had granulation at the 12 o'clock position, which subsequently abated without need for any suturing.  The bimanual examination at that point revealed the cervix to be  a centimeter dilated.  The patient was taken out of the dorsal lithotomy position.  After prepping the vagina and the pelvis, then the indwelling Foley catheter being placed.  The abdomen was then prepped and draped in the usual fashion.  0.25% Marcaine  was injected along the previous Pfannenstiel skin incision site.  Pfannenstiel skin incision was then made, carried down to the rectus fascia.  Rectus fascia was opened with sharp dissection and extended in a superior and inferior fashion.  The rectus  muscle was split in the midline.  The parietal peritoneum was entered  without incident.  On entering the abdominal cavity, he was noted to have omental adhesions into the lower right anterior abdominal wall.  The omental adhesion was lysed.  Self-retaining retractor was then placed. Additional adhesions on the anterior surface of the uterus was lysed.  The vesicouterine peritoneum was opened transversely.  The bladder was displaced with sharp dissection inferiorly.  A small curvilinear low  transverse incision was then made and extended in a cephalic and caudad manner using blunt dissection.  Artificial rupture of membranes was then accomplished.  Subsequent delivery of a live female from the right occiput transverse position was  accomplished.  The baby was delayed cord clamp x 1 minute.  She was suctioned on the abdomen.  Subsequently, the cord was clamped and cut.  The baby was transferred to the waiting pediatricians who assigned Apgars 9 and 9 at 1 and 5 minutes.  The  placenta was spontaneous intact, not sent to pathology.  Uterine cavity was cleaned of debris.  Uterine incision had no extension.  Uterine incision was closed in 2 layers using 0 Monocryl running locked stitch, second layer was imbricating using 0  Monocryl suture.  Bleeding on the right angle resulted in a figure-of-eight suture being placed as well as additional figure-of-eight suture along the incision for hemostasis.  Once hemostasis was achieved, attention was then turned to the left fallopian  tube, which was identified down to the fimbriated end and exposed exteriorly.  The midportion of the tube was grasped with a Babcock.  The underlying mesosalpinx was opened with cautery. Proximal and distal portion of the tube was then tied with 0  chromic suture x 2 proximally and distally.  The intervening segment was then removed.  Attention was then turned to the opposite side where some periovarian adhesion was noted.  Nonetheless, the fimbria was identified down to the ends.  The midportion  of the tube  was grasped with a Babcock.  The underlying mesosalpinx was opened with cautery and then the intervening segment proximally and distally was tied with 0 chromic x2 and the intervening portion of the right tube was removed.  The displaced  omentum was noted to be bleeding edge.  It was clamped with Tresa Endo and the bleeding site was removed and the omental site was free tied with 0 chromic suture.  Small bleeding was noted along the incision.  Again, additional figure-of-eight suture was  placed.  Abdomen was irrigated and suctioned with good hemostasis noted.  Interceed was placed in inverted T fashion.  The Alexis retractor was removed.  The parietal peritoneum was then closed with 2-0 Vicryl.  Rectus fascia was closed with 0 Vicryl x2.   The subcutaneous area was irrigated, small bleeders cauterized.  Interrupted 2-0 plain sutures placed and the skin approximated using 4-0 Vicryl suture.  Steri-Strips and benzoin was placed.  SPECIMEN:  Portion of right and left fallopian tubes sent to pathology.  INTRAOPERATIVE FLUIDS:  1200 ml.  URINE OUTPUT:  200 ml.  ESTIMATED BLOOD LOSS:  799 ml .  COUNTS:  Sponge and instrument counts x2 was correct.  COMPLICATIONS:  None.  DISPOSITION:  The patient tolerated the procedure well, was transferred to the recovery room in stable condition.   NIK D: 06/11/2022 9:13:11 pm T: 06/12/2022 3:46:00 am  JOB: [id number]/ [id number]

## 2022-06-13 LAB — SURGICAL PATHOLOGY

## 2022-06-13 NOTE — Lactation Note (Signed)
This note was copied from a baby's chart. Lactation Consultation Note  Patient Name: Jamie Duarte ZOXWR'U Date: 06/13/2022 Age:44 hours Reason for consult: Follow-up assessment;Early term 37-38.6wks;Breastfeeding assistance;Infant weight loss (12.18% WL)  LC entered the room and the infant was asleep in the bassinet.  Per the birth parent she has been putting the infant to the breast and she recently started supplementing with formula.  LC spoke with the birth parent about pumping frequency, supplementation guidelines, and milk production.  LC encouraged the birth parent to call for a latch check when the infant is ready to breastfeed.  The birth parent is aware that the supplementation guidelines will increase at 48 hours.  The infant should be receiving 7-12 mL after each feeding until 48 hours.  After the 48 hour mark, the infant should receive 18-25 mL.  The birth parent also asked about hands-free pumps.  All questions were answered.  The birth parent will call lactation when the infant is ready to breastfeed.   Infant Feeding Plan:  Breastfeed 8+ times in 24 hours according to feeding cues.  Put the infant to the breast prior to supplementing.  Pump every 3 hours/at every other feeding depending on whether or not the infant latches.  Supplement the infant based on the supplementation guidelines.    Maternal Data    Feeding Mother's Current Feeding Choice: Breast Milk and Formula  LATCH Score                    Lactation Tools Discussed/Used Tools: Comfort gels  Interventions Interventions: Comfort gels;Education  Discharge    Consult Status Consult Status: Follow-up Date: 06/13/22 Follow-up type: In-patient    Delene Loll 06/13/2022, 10:29 AM

## 2022-06-13 NOTE — Progress Notes (Signed)
SVD: {findings; c - section delivery reason:31449}  S:  Pt reports feeling ***/ Tolerating po/ Voiding without problems/ No n/v/ Bleeding is {Description; bleeding vaginal:11356}/ Pain controlled with{treatments; pain control med:13496}    O:  A & O x 3 ***/ VS: Blood pressure 123/80, pulse 73, temperature 98.6 F (37 C), temperature source Oral, resp. rate 16, height 5\' 7"  (1.702 m), weight 92.5 kg, last menstrual period 09/25/2021, SpO2 99 %, unknown if currently breastfeeding.  LABS: No results found for this or any previous visit (from the past 24 hour(s)).  I&O: I/O last 3 completed shifts: In: 2691.3 [P.O.:1250; I.V.:1341.3; IV Piggyback:100] Out: 2451 [Urine:2350; Blood:101]   No intake/output data recorded.  Lungs: {Exam; lungs:5033}  Heart: {Exam; heart:5510}  Abdomen: {PE ABDOMEN POSTPARTUM OBGYN:313106}  Perineum: {exam; perineum:10172}  Lochia: ***  Extremities:{pe extremities ZO:109604}    A/P: POD # ***/PPD # ***/ [id number]  Doing well  Continue routine post partum orders  ***

## 2022-06-13 NOTE — Lactation Note (Signed)
This note was copied from a baby's chart. Lactation Consultation Note  Patient Name: Jamie Duarte [id number] Date: 06/13/2022 Age:44 hours Reason for consult: Follow-up assessment;Primapara;1st time breastfeeding;Infant weight loss;Breastfeeding assistance;Early term 37-38.6wks (12.18% WL)  LC was called to the room for assistance with the latch.  The infant was uninterested in feeding at first.  [facility] assisted the birth parent in removing the infant's clothes and the blanket.  The infant began showing feeding cues.  LC assisted the birth parent in putting the infant to the breast STS.  The infant latched deeply with her lips flanged, tongue was down, sucking was noted with stimulation, and some jaw extensions were noted.  The birth parent stated that she was feeling some pain with latching, but it gets better when the infant is on for a few seconds on the left breast.  The infant fed for 10 min before coming off the breast.  LC assisted the birth parent in burping the infant.  The infant had a wet diaper and LC changed the diaper.  LC assisted the parent with putting the infant to the right breast in the football position.  The infant fed for another 10 min and was still feeding when the LC left the room.  The birth parent is aware to put the infant to the breast prior to supplementing.  The birth parent knows to pump after each feeding.  The birth parent's plan is to supplement with breast milk and formula after each feeding according to supplementation guidelines.    Feeding Mother's Current Feeding Choice: Breast Milk and Formula  LATCH Score Latch: Grasps breast easily, tongue down, lips flanged, rhythmical sucking.  Audible Swallowing: A few with stimulation  Type of Nipple: Everted at rest and after stimulation  Comfort (Breast/Nipple): Filling, red/small blisters or bruises, mild/mod discomfort  Hold (Positioning): Assistance needed to correctly position infant at  breast and maintain latch.  LATCH Score: 7   Lactation Tools Discussed/Used Tools: Comfort gels  Interventions Interventions: Assisted with latch;Adjust position;Support pillows;Education  Consult Status Consult Status: Follow-up Date: 06/14/22 Follow-up type: In-patient   Delene Loll 06/13/2022, 11:41 AM

## 2022-06-14 ENCOUNTER — Other Ambulatory Visit (HOSPITAL_COMMUNITY): Payer: Self-pay

## 2022-06-14 MED ORDER — OXYCODONE HCL 5 MG PO TABS
5.0000 mg | ORAL_TABLET | ORAL | 0 refills | Status: AC | PRN
  Filled 2022-06-14 (×2): qty 30, 3d supply, fill #0

## 2022-06-14 NOTE — Lactation Note (Signed)
This note was copied from Jamie baby's chart. Lactation Consultation Note  Patient Name: Jamie Duarte [id number] Date: 06/14/2022 Age:44 hours Reason for consult: Follow-up assessment;Early term 37-38.6wks;Engorgement  LC into room for follow up. Lactating parent reports improvements with feeding and pumping. LP collected ~35 mL of expressed breast milk last time she pumped. LP talks about breast fullness and warmth. LC noted engorgement, explained ways to manage engorgement, and provided hand out. Educated about maintenance pumping, LP collected ~15 mL in 5 minutes. Infant started cueing. Parent is able to latch independently but reports nipple pain. Right nipple seems cause discomfort as it stretches but it looks intact, no bruises or bleeding noted. LP states it is also uncomfortable when she pumps. Reviewed breast hold, massaging breast during feeding and keeping infant awake at breast. Demonstrated chin tug to improve latch. Infant stayed at breast ~27 minutes.  Provided breast milk labels. Encouraged nipple care with expressed colostrum, followed by coconut oil. Parents are very pleasant and appreciative of information.  Plan: 1-Feeding on demand 8-12 times in 24h period. 2-Supplement with expressed breast milk as needed following guidelines, pacing, upright position and burping frequently.  3-Pump as needed  4-Encouraged parental rest, hydration and food intake.   Contact LC as needed for feeds/support/concerns/questions. All questions answered at this time. Provided information about resources after discharge.     Maternal Data Has patient been taught Hand Expression?: Yes  Feeding Mother's Current Feeding Choice: Breast Milk and Formula  LATCH Score Latch: Grasps breast easily, tongue down, lips flanged, rhythmical sucking.  Audible Swallowing: Spontaneous and intermittent  Type of Nipple: Everted at rest and after stimulation  Comfort (Breast/Nipple): Filling, red/small  blisters or bruises, mild/mod discomfort  Hold (Positioning): No assistance needed to correctly position infant at breast.  LATCH Score: 9   Lactation Tools Discussed/Used Tools: Pump;Flanges;Coconut oil Flange Size: 24 Breast pump type: Double-Electric Breast Pump;Manual Pump Education: Milk Storage;Setup, frequency, and cleaning Reason for Pumping: stimulaion and supplementation Pumping frequency: at least 8 times in 24h Pumped volume: 15 mL (only 5 min)  Interventions Interventions: Breast feeding basics reviewed;Assisted with latch;Skin to skin;Breast massage;Coconut oil;Breast compression;Support pillows;Expressed milk;DEBP;Education;Hand pump;LC Services brochure  Discharge Discharge Education: Engorgement and breast care;Warning signs for feeding baby Pump: DEBP;Manual;Personal (Spectra) WIC Program: No  Consult Status Consult Status: Complete Date: 06/14/22 Follow-up type: Call as needed    Jamie Duarte Jamie Duarte Jamie Duarte 06/14/2022, 9:19 AM

## 2022-06-14 NOTE — Discharge Instructions (Signed)
Call if temperature greater than equal to 100.4, nothing per vagina for 4-6 weeks or severe nausea vomiting, increased incisional pain , drainage or redness in the incision site, no straining with bowel movements, showers no bath °

## 2022-06-14 NOTE — Discharge Summary (Signed)
Postpartum Discharge Summary  Date of Service updated***     Patient Name: Jamie Duarte DOB: 07/24/1980 MRN: [id number]  Date of admission: 06/11/2022 Delivery date:06/11/2022  Delivering provider: Derisha Funderburke  Date of discharge: 06/14/2022  Admitting diagnosis: Postpartum care following cesarean delivery [Z39.2] Intrauterine pregnancy: [redacted]w[redacted]d     Secondary diagnosis:  Principal Problem:   Postpartum care following cesarean delivery  Additional problems: ***    Discharge diagnosis: {DX.:23714}                                              Post partum procedures:{Postpartum procedures:23558} Augmentation: {Augmentation:20782} Complications: {OB Labor/Delivery Complications:20784}  Hospital course: {Courses:23701}  Magnesium Sulfate received: {Mag received:30440022} BMZ received: {BMZ received:30440023} Rhophylac:{Rhophylac received:30440032} LKG:{MWN:02725366} T-DaP:{Tdap:23962} Flu: {YQI:34742} Transfusion:{Transfusion received:30440034}  Physical exam  Vitals:   06/13/22 0835 06/13/22 1749 06/13/22 2012 06/14/22 0551  BP: (!) 141/90 123/80 131/86 130/83  Pulse:  73 80 69  Resp:  16 17 17   Temp:  98.6 F (37 C) 99.3 F (37.4 C) 98.1 F (36.7 C)  TempSrc:  Oral Oral Oral  SpO2:   98%   Weight:      Height:       General: {Exam; general:21111117} Lochia: {Desc; appropriate/inappropriate:30686::"appropriate"} Uterine Fundus: {Desc; firm/soft:30687} Incision: {Exam; incision:21111123} DVT Evaluation: {Exam; dvt:2111122} Labs: Lab Results  Component Value Date   WBC 13.2 (H) 06/12/2022   HGB 11.2 (L) 06/12/2022   HCT 32.1 (L) 06/12/2022   MCV 90.2 06/12/2022   PLT 262 06/12/2022      Latest Ref Rng & Units 04/26/2022    1:26 PM  CMP  Glucose 70 - 99 mg/dL 73   BUN 6 - 20 mg/dL 5   Creatinine 5.95 - 6.38 mg/dL 7.56   Sodium 433 - 295 mmol/L 136   Potassium 3.5 - 5.1 mmol/L 3.5   Chloride 98 - 111 mmol/L 104   CO2 22 - 32 mmol/L 22    Calcium 8.9 - 10.3 mg/dL 9.2   Total Protein 6.5 - 8.1 g/dL 7.1   Total Bilirubin 0.3 - 1.2 mg/dL 0.9   Alkaline Phos 38 - 126 U/L 279   AST 15 - 41 U/L 23   ALT 0 - 44 U/L 22    Edinburgh Score:    06/13/2022    8:15 PM  Edinburgh Postnatal Depression Scale Screening Tool  I have been able to laugh and see the funny side of things. 0  I have looked forward with enjoyment to things. 0  I have blamed myself unnecessarily when things went wrong. 0  I have been anxious or worried for no good reason. 0  I have felt scared or panicky for no good reason. 0  Things have been getting on top of me. 0  I have been so unhappy that I have had difficulty sleeping. 0  I have felt sad or miserable. 0  I have been so unhappy that I have been crying. 0  The thought of harming myself has occurred to me. 0  Edinburgh Postnatal Depression Scale Total 0      After visit meds:  Allergies as of 06/14/2022   No Known Allergies      Medication List     TAKE these medications    amLODipine 10 MG tablet Commonly known as: NORVASC Take 1 tablet (10 mg total) by  mouth daily.   cholestyramine 4 g packet Commonly known as: Questran Take by mouth as directed twice daily. What changed:  how much to take how to take this when to take this   Fish Oil 1000 MG Caps Take 1,000 mg by mouth daily.   folic acid 1 MG tablet Commonly known as: FOLVITE Take 1 tablet by mouth every day . What changed:  how much to take how to take this when to take this   mesalamine 1.2 g EC tablet Commonly known as: LIALDA Take 2 tablets (2.4 g total) by mouth twice daily What changed: when to take this   metoprolol succinate 100 MG 24 hr tablet Commonly known as: TOPROL-XL Take 1 tablet (100 mg total) by mouth daily for blood pressure.   oxyCODONE 5 MG immediate release tablet Commonly known as: Oxy IR/ROXICODONE Take 1-2 tablets (5-10 mg total) by mouth every 4 (four) hours as needed for up to 7 days  for moderate pain.   PRENATAL PO Take 1 tablet by mouth daily.   ursodiol 300 MG capsule Commonly known as: ACTIGALL Take 2 capsules by mouth 2 times a day. What changed:  how much to take how to take this when to take this   valACYclovir 500 MG tablet Commonly known as: VALTREX Take 1 tablet (500 mg total) by mouth 2 (two) times daily starting 06/04/2022         Discharge home in stable condition Infant Feeding: {Baby feeding:23562} Infant Disposition:{CHL IP OB HOME WITH ZOXWRU:04540} Discharge instruction: per After Visit Summary and Postpartum booklet. Activity: Advance as tolerated. Pelvic rest for 6 weeks.  Diet: {OB diet:21111121} Anticipated Birth Control: {Birth Control:23956} Postpartum Appointment:{Outpatient follow up:23559} Additional Postpartum F/U: {PP Procedure:23957} Future Appointments: Future Appointments  Date Time Provider Department Center  06/24/2022  9:40 AM Tobb, Lavona Mound, DO [facility] None  07/10/2022  8:20 AM Corliss Skains, Janalyn Rouse, MD [facility] None   Follow up Visit:  Follow-up Information     Maxie Better, MD Follow up in 6 week(s).   Specialty: Obstetrics and Gynecology Contact information: [address] [place] [address] [place] [address] [phone]                     06/14/2022 Serita Kyle, MD

## 2022-06-14 NOTE — Progress Notes (Incomplete)
SVD: {findings; c - section delivery reason:31449}  S:  Pt reports feeling ***/ Tolerating po/ Voiding without problems/ No n/v/ Bleeding is {Description; bleeding vaginal:11356}/ Pain controlled with{treatments; pain control med:13496}    O:  A & O x 3 ***/ VS: Blood pressure 130/83, pulse 69, temperature 98.1 F (36.7 C), temperature source Oral, resp. rate 17, height 5\' 7"  (1.702 m), weight 92.5 kg, last menstrual period 09/25/2021, SpO2 98 %, unknown if currently breastfeeding.  LABS: No results found for this or any previous visit (from the past 24 hour(s)).  I&O: No intake/output data recorded.   No intake/output data recorded.  Lungs: {Exam; lungs:5033}  Heart: {Exam; heart:5510}  Abdomen: {PE ABDOMEN POSTPARTUM OBGYN:313106}  Perineum: {exam; perineum:10172}  Lochia: ***  Extremities:{pe extremities ZO:109604}    A/P: POD # ***/PPD # ***/ [id number]  Doing well  Continue routine post partum orders  ***

## 2022-06-17 ENCOUNTER — Other Ambulatory Visit: Payer: Self-pay

## 2022-06-17 ENCOUNTER — Other Ambulatory Visit (HOSPITAL_COMMUNITY): Payer: Self-pay

## 2022-06-17 MED ORDER — POTASSIUM CHLORIDE CRYS ER 20 MEQ PO TBCR
20.0000 meq | EXTENDED_RELEASE_TABLET | Freq: Every day | ORAL | 0 refills | Status: DC
  Filled 2022-06-17: qty 3, 3d supply, fill #0

## 2022-06-17 MED ORDER — FUROSEMIDE 40 MG PO TABS
40.0000 mg | ORAL_TABLET | Freq: Every day | ORAL | 0 refills | Status: DC
  Filled 2022-06-17: qty 3, 3d supply, fill #0

## 2022-06-17 NOTE — Progress Notes (Signed)
Prescription for Lasix and Potassium sent to pharmacy.  ? ?

## 2022-06-18 ENCOUNTER — Other Ambulatory Visit (HOSPITAL_COMMUNITY): Payer: Self-pay

## 2022-06-18 ENCOUNTER — Other Ambulatory Visit: Payer: Self-pay

## 2022-06-19 ENCOUNTER — Other Ambulatory Visit (HOSPITAL_COMMUNITY): Payer: Self-pay

## 2022-06-19 ENCOUNTER — Other Ambulatory Visit: Payer: Self-pay

## 2022-06-21 ENCOUNTER — Telehealth (HOSPITAL_COMMUNITY): Payer: Self-pay

## 2022-06-21 NOTE — Telephone Encounter (Signed)
Patient reports feeling good. Patient states that her bleeding is light. She states that her incision is doing well and she has taken her dressing off. Patient declines any other questions/concerns about her health and healing.  Patient reports that baby is doing well. Feeding well every 2-3 hours and gaining weight. Baby sleeps in a bassinet. RN reviewed ABC's of safe sleep with patient. Patient declines any questions or concerns about baby.  EPDS score is 4.  Jamie Duarte [facility]  Perinatal Services   06/21/22,1550

## 2022-06-24 ENCOUNTER — Other Ambulatory Visit: Payer: Self-pay

## 2022-06-24 ENCOUNTER — Encounter: Payer: Self-pay | Admitting: Cardiology

## 2022-06-24 ENCOUNTER — Ambulatory Visit: Payer: Commercial Managed Care - PPO | Attending: Cardiology | Admitting: Cardiology

## 2022-06-24 ENCOUNTER — Other Ambulatory Visit (HOSPITAL_COMMUNITY): Payer: Self-pay

## 2022-06-24 ENCOUNTER — Telehealth: Payer: Self-pay

## 2022-06-24 VITALS — BP 138/79 | HR 75 | Ht 67.0 in | Wt 187.0 lb

## 2022-06-24 DIAGNOSIS — I10 Essential (primary) hypertension: Secondary | ICD-10-CM

## 2022-06-24 MED ORDER — HYDROCHLOROTHIAZIDE 12.5 MG PO CAPS
12.5000 mg | ORAL_CAPSULE | ORAL | 3 refills | Status: AC
  Filled 2022-06-24: qty 45, 90d supply, fill #0

## 2022-06-24 NOTE — Progress Notes (Signed)
Cardio-Obstetrics Clinic  Follow Up Note   Date:  06/24/2022   ID:  Jamie Duarte, DOB 11/28/1980, MRN [id number]  PCP:  Blair Heys, MD   [facility] Providers Cardiologist:  Thomasene Ripple, DO  Electrophysiologist:  None        Referring MD: Blair Heys, MD   Chief Complaint: " I am doing ok"  History of Present Illness:    Jamie Duarte is a 42 y.o. female [phone] who returns for follow up of hypertension.   Medical history includes hypertension which was diagnosed about 6 to 7 years ago currently on amlodipine 5 mg daily, HCTZ 25, metoprolol succinate 100 mg daily.  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  She is concerned that she may have lost this pregnancy at 6 weeks because she has been experiencing some bleeding and clotting.  I saw the patient on May 09, 2021 at that time she was [redacted] weeks pregnant.  She was on amlodipine we increase that to 10 mg daily.  We stopped the hydrochlorothiazide and continue to metoprolol succinate.  We discussed that if her blood pressure is not appropriate to target we would have added hydralazine.  I saw the patient 09/25/2021 at time she had lost the baby.    She was seen on 11/19/2021 at that time I continued her Amlodipine. She was doing well until yesterday she had to go to the ED for cramping.   Saw her in February 2024, at that time no changes were made.  Since I saw the patient she has been taken off work due to significant cramping as well.  I saw the patient on 05/30/2022 at that time her blood pressure was under control. Since her last visit she has delivered and is doing well.   She was able to take a few days of Lasix post partum. She tells me that her blood pressure is  on the elevated side.    Prior CV Studies Reviewed: The following studies were reviewed today: None today  Past Medical History:  Diagnosis Date   Family history of adverse reaction to anesthesia    mom hard to wake up   Herpes    Hypertension    PSC (primary sclerosing cholangitis)    Ulcerative colitis (HCC)    Vaginal Pap smear, abnormal     Past Surgical History:  Procedure Laterality Date   BREAST LUMPECTOMY Left 1998   CERCLAGE REMOVAL     CERVICAL CERCLAGE N/A 12/28/2021   Procedure: CERCLAGE CERVICAL;  Surgeon: Maxie Better, MD;  Location: [facility] LD ORS;  Service: Gynecology;  Laterality: N/A;   CESAREAN SECTION     CESAREAN SECTION WITH BILATERAL TUBAL LIGATION N/A 06/11/2022   Procedure: CESAREAN SECTION WITH BILATERAL TUBAL LIGATION;  Surgeon: Maxie Better, MD;  Location: [facility] LD ORS;  Service: Obstetrics;  Laterality: N/A;   LEEP        OB History     Gravida  4   Para  3   Term  1   Preterm  2   AB  1   Living  1      SAB  1   IAB      Ectopic      Multiple  1   Live Births  4               Current Medications: Current Meds  Medication Sig   amLODipine (NORVASC) 10 MG tablet Take 1 tablet (10 mg total) by mouth daily.   cholestyramine (QUESTRAN) 4 g packet Take by mouth as directed twice daily. (Patient taking differently: Take 4 g by mouth 2 (two) times daily.)   folic acid (FOLVITE) 1 MG tablet Take 1 tablet by mouth every day . (Patient taking differently: Take 1 mg by mouth daily.)   furosemide (LASIX) 40 MG tablet Take 1 tablet (40 mg total) by mouth daily for 3 days.   hydrochlorothiazide (MICROZIDE) 12.5 MG capsule Take 1 capsule (12.5 mg total) by mouth every other day.   mesalamine (LIALDA) 1.2 g EC tablet Take 2 tablets (2.4 g total) by mouth twice daily (Patient taking differently: Take 2.4 g by mouth in the morning and at bedtime.)   metoprolol succinate (TOPROL-XL) 100 MG 24 hr tablet Take 1 tablet (100 mg total)  by mouth daily for blood pressure.   Omega-3 Fatty Acids (FISH OIL) 1000 MG CAPS Take 1,000 mg by mouth daily.   potassium chloride SA (KLOR-CON M20) 20 MEQ tablet Take 1 tablet (20 mEq total) by mouth daily for 3 days.   Prenatal Vit-Fe Fumarate-FA (PRENATAL PO) Take 1 tablet by mouth daily.   ursodiol (ACTIGALL) 300 MG capsule Take 2 capsules by mouth 2 times a day. (Patient taking differently: Take 600 mg by mouth 2 (two) times daily.)   valACYclovir (VALTREX) 500 MG tablet Take 1 tablet (500 mg total) by mouth 2 (two) times daily starting 06/04/2022     Allergies:   Patient has no known allergies.   Social History   Socioeconomic History   Marital status: Married    Spouse name: Not on file   Number of children: Not on file   Years of education: Not on file   Highest education level: Not on file  Occupational History   Not on file  Tobacco Use   Smoking status: Never   Smokeless tobacco: Never  Vaping Use   Vaping Use: Never used  Substance and Sexual Activity   Alcohol use: Never   Drug use: Never   Sexual activity: Yes  Other Topics Concern   Not on file  Social History Narrative   Not on file   Social Determinants of Health   Financial Resource Strain: Not on file  Food Insecurity: No Food Insecurity (06/12/2022)   Hunger Vital Sign    Worried About Running Out of Food in the Last Year: Never true    Ran Out of Food in the Last Year: Never true  Transportation Needs: No Transportation Needs (06/12/2022)   PRAPARE - Administrator, Civil Service (Medical): No    Lack of Transportation (Non-Medical): No  Physical Activity: Not on file  Stress: Not on file  Social Connections: Not on file      Family History  Problem Relation Age of Onset   Hypertension Mother    Healthy Mother    Cancer Mother        breast    Hypertension Father    Healthy Father    Healthy Brother    Allergic rhinitis Neg Hx    Angioedema Neg Hx    Asthma Neg Hx    Atopy  Neg Hx    Eczema Neg Hx    Urticaria Neg Hx    Immunodeficiency Neg Hx  ROS:   Please see the history of present illness.     All other systems reviewed and are negative.   Labs/EKG Reviewed:    EKG:   EKG sinus rhythm,.   Recent Labs: 02/14/2022: Magnesium 2.0 04/26/2022: ALT 22; BUN 5; Creatinine, Ser 0.67; Potassium 3.5; Sodium 136 06/12/2022: Hemoglobin 11.2; Platelets 262   Recent Lipid Panel No results found for: "CHOL", "TRIG", "HDL", "CHOLHDL", "LDLCALC", "LDLDIRECT"  Physical Exam:    VS:  BP 138/79   Pulse 75   Ht 5\' 7"  (1.702 m)   Wt 187 lb (84.8 kg)   LMP 09/25/2021   BMI 29.29 kg/m     Wt Readings from Last 3 Encounters:  06/24/22 187 lb (84.8 kg)  06/11/22 204 lb (92.5 kg)  05/30/22 203 lb (92.1 kg)     GEN:  Well nourished, well developed in no acute distress HEENT: Normal NECK: No JVD; No carotid bruits LYMPHATICS: No lymphadenopathy CARDIAC: RRR, no murmurs, rubs, gallops RESPIRATORY:  Clear to auscultation without rales, wheezing or rhonchi  ABDOMEN: Soft, non-tender, non-distended MUSCULOSKELETAL:  No edema; No deformity  SKIN: Warm and dry NEUROLOGIC:  Alert and oriented x 3 PSYCHIATRIC:  Normal affect    Risk Assessment/Risk Calculators:                 ASSESSMENT & PLAN:     She is postpartum  Blood pressure is still not quite under control.  We did give the patient a few days of Lasix which she has completed.  I like to keep her less than 130/80 mmHg.  Will give her hydrochlorothiazide 12.5 mg every other day.  Patient Instructions  Medication Instructions:  Your physician has recommended you make the following change in your medication:  START: Hydrochlorothiazide 12.5 mg once every other day  *If you need a refill on your cardiac medications before your next appointment, please call your pharmacy*   Lab Work: None   Testing/Procedures: None   Follow-Up: At [facility], you and your health needs  are our priority.  As part of our continuing mission to provide you with exceptional heart care, we have created designated Provider Care Teams.  These Care Teams include your primary Cardiologist (physician) and Advanced Practice Providers (APPs -  Physician Assistants and Nurse Practitioners) who all work together to provide you with the care you need, when you need it.   Your next appointment:   12 week(s)  Provider:   Thomasene Ripple, DO     Dispo:  No follow-ups on file.   Medication Adjustments/Labs and Tests Ordered: Current medicines are reviewed at length with the patient today.  Concerns regarding medicines are outlined above.  Tests Ordered: No orders of the defined types were placed in this encounter.  Medication Changes: Meds ordered this encounter  Medications   hydrochlorothiazide (MICROZIDE) 12.5 MG capsule    Sig: Take 1 capsule (12.5 mg total) by mouth every other day.    Dispense:  45 capsule    Refill:  3

## 2022-06-24 NOTE — Patient Instructions (Addendum)
Medication Instructions:  Your physician has recommended you make the following change in your medication:  START: Hydrochlorothiazide 12.5 mg once every other day  *If you need a refill on your cardiac medications before your next appointment, please call your pharmacy*   Lab Work: None   Testing/Procedures: None   Follow-Up: At [facility] you and your health needs are our priority.  As part of our continuing mission to provide you with exceptional heart care, we have created designated Provider Care Teams.  These Care Teams include your primary Cardiologist (physician) and Advanced Practice Providers (APPs -  Physician Assistants and Nurse Practitioners) who all work together to provide you with the care you need, when you need it.   Your next appointment:   12 week(s)  Provider:   Thomasene Ripple, DO

## 2022-06-24 NOTE — Telephone Encounter (Signed)
Called pt, went over AVS. Prescription sent to pharmacy.

## 2022-07-01 NOTE — Progress Notes (Unsigned)
Office Visit Note  Patient: Jamie Duarte             Date of Birth: Jul 11, 1980           MRN: [id number]             PCP: Blair Heys, MD Referring: Maxie Better, MD Visit Date: 07/02/2022 Occupation: @GUAROCC @  Subjective:  Positive SSA antibody  History of Present Illness: Jamie Duarte is a 42 y.o. female with history of ulcerative colitis, primary sclerosing cholangitis, positive ANA and positive SSA antibodies.  She was seen in the office last on April 06, 2018.  At the time she was on Xeljanz for ulcerative colitis by Dr. Loreta Ave.  Patient had no clinical features of lupus or Sjogren's.  She has been referred by Dr. Cherly Hensen again for evaluation due to positive SSA antibody found during the recent labs.  Patient denies any history of fatigue, oral ulcers, nasal ulcers, malar rash, sicca symptoms, Raynaud's, inflammatory arthritis, photosensitivity or lymphadenopathy.  Patient was diagnosed with primary sclerosing cholangitis several years ago.  She was again seen at [facility] for [facility] and had repeat  liver biopsy in February 2022.  She has been followed at [facility].  She has been on Actigall for cholestasis.  Patient was on Xeljanz for ulcerative colitis by Dr. Loreta Ave which was discontinued at the onset of pregnancy.  He has not had any flares of ulcerative colitis.  She has known history of chronic hypertension for many years. Patient was evaluated by Dr.Tobb for hypertension during pregnancy.  She had known history of hypertension.  She is currently on amlodipine and metoprolol.  Gravida 4 para 2 (preterm birth at 105 weeks), preterm birth twins at 67 weeks, miscarriages 1, she delivered at 47 weeks by C-section on June 11, 2022.  Patient denies any complications during the pregnancy.  She is 3 weeks postpartum and is doing well.  She states her baby had regular echocardiograms during the pregnancy and did not have any complications.  Her previous C-section was in 2012 which was  complicated with preeclampsia.  She had twin pregnancy loss at [redacted] weeks gestation 2002.  She is s/p tubal ligation now.  Activities of Daily Living:  Patient reports morning stiffness for 0 minutes.   Patient Denies nocturnal pain.  Difficulty dressing/grooming: Denies Difficulty climbing stairs: Denies Difficulty getting out of chair: Denies Difficulty using hands for taps, buttons, cutlery, and/or writing: Denies  Review of Systems  Constitutional:  Negative for fatigue.  HENT:  Negative for mouth sores and mouth dryness.   Eyes:  Negative for dryness.  Respiratory:  Negative for shortness of breath.   Cardiovascular:  Negative for chest pain and palpitations.  Gastrointestinal:  Negative for blood in stool, constipation and diarrhea.  Endocrine: Negative for increased urination.  Genitourinary:  Negative for involuntary urination.  Musculoskeletal:  Negative for joint pain, gait problem, joint pain, joint swelling, myalgias, muscle weakness, morning stiffness, muscle tenderness and myalgias.  Skin:  Negative for color change, rash, hair loss and sensitivity to sunlight.  Allergic/Immunologic: Negative for susceptible to infections.  Neurological:  Negative for dizziness and headaches.  Hematological:  Negative for swollen glands.  Psychiatric/Behavioral:  Negative for depressed mood and sleep disturbance. The patient is not nervous/anxious.     PMFS History:  Patient Active Problem List   Diagnosis Date Noted   Postpartum care following cesarean delivery 06/11/2022   Preterm labor in third trimester 04/26/2022   Cervical incompetence affecting management of  pregnancy in second trimester, antepartum 12/28/2021   Chronic hypertension 09/25/2021   HTN in pregnancy, chronic 05/09/2021   Mixed hyperlipidemia 05/09/2021   Primary osteoarthritis of both knees 04/06/2018   Iron deficiency anemia due to chronic blood loss 06/24/2017   Ulcerative colitis (HCC) 03/04/2016    Trochanteric bursitis of both hips 03/04/2016   ANA positive 03/04/2016   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   High risk medication use 03/04/2016   Autoimmune disease (HCC) 03/04/2016   History of hypertension 03/04/2016   History of hypothyroidism 03/04/2016   Urticaria 06/06/2015    Past Medical History:  Diagnosis Date   Family history of adverse reaction to anesthesia    mom hard to wake up   Herpes    Hypertension    PSC (primary sclerosing cholangitis)    Ulcerative colitis (HCC)    Vaginal Pap smear, abnormal     Family History  Problem Relation Age of Onset   Hypertension Mother    Healthy Mother    Cancer Mother        breast    Hypertension Father    Healthy Father    Healthy Brother    Healthy Daughter    Allergic rhinitis Neg Hx    Angioedema Neg Hx    Asthma Neg Hx    Atopy Neg Hx    Eczema Neg Hx    Urticaria Neg Hx    Immunodeficiency Neg Hx    Past Surgical History:  Procedure Laterality Date   BREAST LUMPECTOMY Left 1998   CERCLAGE REMOVAL     CERVICAL CERCLAGE N/A 12/28/2021   Procedure: CERCLAGE CERVICAL;  Surgeon: Maxie Better, MD;  Location: [facility] LD ORS;  Service: Gynecology;  Laterality: N/A;   CESAREAN SECTION  2009   CESAREAN SECTION WITH BILATERAL TUBAL LIGATION N/A 06/11/2022   Procedure: CESAREAN SECTION WITH BILATERAL TUBAL LIGATION;  Surgeon: Maxie Better, MD;  Location: [facility] LD ORS;  Service: Obstetrics;  Laterality: N/A;   LEEP     Social History   Social History Narrative   Not on file   Immunization History  Administered Date(s) Administered   PFIZER(Purple Top)SARS-COV-2 Vaccination 08/06/2019, 08/27/2019     Objective: Vital Signs: BP 130/85 (BP Location: Right Arm, Patient Position: Sitting, Cuff Size: Normal)   Pulse 62   Resp 14   Ht 5\' 7"  (1.702 m)   Wt 188 lb (85.3 kg)   LMP 09/25/2021   Breastfeeding Yes   BMI 29.44 kg/m    Physical Exam Vitals and nursing note reviewed.   Constitutional:      Appearance: She is well-developed.  HENT:     Head: Normocephalic and atraumatic.  Eyes:     Conjunctiva/sclera: Conjunctivae normal.  Cardiovascular:     Rate and Rhythm: Normal rate and regular rhythm.     Heart sounds: Normal heart sounds.  Pulmonary:     Effort: Pulmonary effort is normal.     Breath sounds: Normal breath sounds.  Abdominal:     General: Bowel sounds are normal.     Palpations: Abdomen is soft.  Musculoskeletal:     Cervical back: Normal range of motion.  Lymphadenopathy:     Cervical: No cervical adenopathy.  Skin:    General: Skin is warm and dry.     Capillary Refill: Capillary refill takes less than 2 seconds.  Neurological:     Mental Status: She is alert and oriented to person, place, and time.  Psychiatric:  Behavior: Behavior normal.      Musculoskeletal Exam: Cervical spine was in good range of motion.  Shoulder joints, elbow joints, wrist joints, MCPs PIPs and DIPs with good range of motion with no synovitis.  Hip joints, knee joints, ankles, MTPs and PIPs been good range of motion with no synovitis.  CDAI Exam: CDAI Score: -- Patient Global: --; Provider Global: -- Swollen: --; Tender: -- Joint Exam 07/02/2022   No joint exam has been documented for this visit   There is currently no information documented on the homunculus. Go to the Rheumatology activity and complete the homunculus joint exam.  Investigation: No additional findings.  Imaging: [place] MFM FETAL BPP WO NON STRESS  Result Date: 06/04/2022 ----------------------------------------------------------------------  OBSTETRICS REPORT                       (Signed Final 06/04/2022 08:23 am) ---------------------------------------------------------------------- Patient Info  ID #:       [id number]                          D.O.B.:  17-Apr-1980 (41 yrs)  Name:       PRESIOUS STROW                Visit Date: 06/04/2022 07:25 am              [place]  ---------------------------------------------------------------------- Performed By  Attending:        Braxton Feathers DO       Ref. Address:     [facility]                                                             OB/GYN                                                             [address]                                                             [place]                                                             [address]  Performed By:     Anabel Halon          [facility]                                     [facility]  Referred By:      Nena Jordan                    COUSINS MD ---------------------------------------------------------------------- Orders  #  Description                           Code        Ordered By  1  [place] MFM FETAL BPP WO NON               76819.01    RAVI SHANKAR     STRESS  2  [place] MFM OB FOLLOW UP                   76816.01    RAVI [facility] ----------------------------------------------------------------------  #  Order #                     Accession #                Episode #  1  [id number]                   [id number]                 [id number]  2  [id number]                   [id number]                 [id number] ---------------------------------------------------------------------- Indications  Hypertension - Chronic/Pre-existing            O10.019  Advanced maternal age primigravida 38+,        O76.513  third trimester (41 yrs)  Cervical incompetence, third trimester         O34.33  Cervical cerclage suture present, third        O34.33  trimester (12/28/21)  Uterine fibroids                               O34.10  Medical complication of pregnancy (SSA         O26.90  antibodies)  [redacted] weeks gestation of pregnancy                 Z3A.36  Previous cesarean delivery, antepartum  O34.219  Poor obstetric history (prior pre-term labor)  O09.219  Poor obstetric history (prior pre-term labor)  O09.219  Neg Materniti 21  LR NIPS/AFP neg ---------------------------------------------------------------------- Fetal Evaluation  Num Of Fetuses:         1  Fetal Heart Rate(bpm):  134  Cardiac Activity:       Observed  Presentation:           Cephalic  Placenta:               Posterior Fundal  P. Cord Insertion:      Previously visualized  Amniotic Fluid  AFI FV:      Within normal limits  AFI Sum(cm)     %Tile       Largest Pocket(cm)  22.37           85          7.41  RUQ(cm)       RLQ(cm)       LUQ(cm)        LLQ(cm)  6.8           7.41          3.16           5 ---------------------------------------------------------------------- Biophysical Evaluation  Amniotic F.V:   Pocket => 2 cm             F. Tone:        Observed  F. Movement:    Observed                   Score:          8/8  F. Breathing:   Observed ---------------------------------------------------------------------- Biometry  BPD:      89.2  mm     G. Age:  36w 1d         61  %    CI:        82.99   %    70 - 86                                                          FL/HC:      23.3   %    20.1 - 22.1  HC:      308.8  mm     G. Age:  34w 3d        2.8  %    HC/AC:      0.88        0.93 - 1.11  AC:       352   mm     G. Age:  39w 1d       > 99  %    FL/BPD:     80.7   %    71 - 87  FL:         72  mm     G. Age:  36w 6d         69  %    FL/AC:      20.5   %    20 - 24  Est. FW:    3279  gm      7 lb 4 oz     90  % ---------------------------------------------------------------------- OB History  Gravidity:    4  Term:   0        Prem:   2        SAB:   1  TOP:          0       Ectopic:  0        Living: 0 ---------------------------------------------------------------------- Gestational Age  LMP:           36w 0d        Date:  09/25/21                   EDD:   07/02/22   U/S Today:     36w 5d                                        EDD:   06/27/22  Best:          36w 0d     Det. By:  LMP  (09/25/21)          EDD:   07/02/22 ---------------------------------------------------------------------- Anatomy  Cranium:               Appears normal         LVOT:                   Previously seen  Cavum:                 Previously seen        Aortic Arch:            Previously seen  Ventricles:            Previously seen        Ductal Arch:            Previously seen  Choroid Plexus:        Previously seen        Diaphragm:              Appears normal  Cerebellum:            Previously seen        Stomach:                Appears normal, left                                                                        sided  Posterior Fossa:       Previously seen        Abdomen:                Previously seen  Nuchal Fold:           Previously seen        Abdominal Wall:         Previously seen  Face:                  Orbits and profile     Cord Vessels:           Previously seen  previously seen  Lips:                  Previously seen        Kidneys:                Appear normal  Palate:                Not well visualized    Bladder:                Appears normal  Thoracic:              Previously seen        Spine:                  Previously seen  Heart:                 Previously seen        Upper Extremities:      Previously seen  RVOT:                  Previously seen        Lower Extremities:      Previously seen ---------------------------------------------------------------------- Cervix Uterus Adnexa  Cervix  Not visualized (advanced GA >24wks)  Uterus  No abnormality visualized.  Right Ovary  Within normal limits.  Left Ovary  Within normal limits.  Cul De Sac  No free fluid seen.  Adnexa  No abnormality visualized ---------------------------------------------------------------------- Comments  The patient is here for a follow-up BPP and growth ultrasound  at 36w 0d  for AMA, CHTN, UC. EDD: 07/02/2022 dated by  LMP  (09/25/21). She has no concerns today.  Sonographic findings  Single intrauterine pregnancy.  Fetal cardiac activity: Observed.  Presentation: Cephalic.  Interval fetal anatomy appears normal.  Fetal biometry shows the estimated fetal weight at the 90  percentile.  Amniotic fluid volume: Within normal limits. AFI: 22.37 cm.  MVP: 7.41 cm.  Placenta: Posterior Fundal.  BPP: 8/8.  Recommendations  - Cesarean delivery scheduled for next week. Standard OB  precautions given.  - She will reach out to her GI doctor to discuss starting her  medication for UC after delivery ----------------------------------------------------------------------                  Braxton Feathers, DO Electronically Signed Final Report   06/04/2022 08:23 am ----------------------------------------------------------------------  [place] MFM OB FOLLOW UP  Result Date: 06/04/2022 ----------------------------------------------------------------------  OBSTETRICS REPORT                       (Signed Final 06/04/2022 08:23 am) ---------------------------------------------------------------------- Patient Info  ID #:       [id number]                          D.O.B.:  [birth year] (41 yrs)  Name:       GLENNDA LOUK                Visit Date: 06/04/2022 07:25 am              [place] ---------------------------------------------------------------------- Performed By  Attending:        Braxton Feathers DO       Ref. Address:     [facility]  [facility]                                                             [address]                                                             [place]                                                             [address]  Performed By:     Anabel Halon          Location[facility]                    [facility]                                      [facility]  Referred By:      Nena Jordan                    COUSINS MD ---------------------------------------------------------------------- Orders  #  Description                           Code        Ordered By  1  [place] MFM FETAL BPP WO NON               76819.01    RAVI SHANKAR     STRESS  2  [place] MFM OB FOLLOW UP                   60454.09    RAVI SHANKAR ----------------------------------------------------------------------  #  Order #                     Accession #                Episode #  1  [id number]                   [id number]                 [id number]  2  [id number]                   [id number]                 [id number] ---------------------------------------------------------------------- Indications  Hypertension - Chronic/Pre-existing            O10.019  Advanced maternal age primigravida 58+,        O47.513  third trimester (41 yrs)  Cervical incompetence, third trimester         O34.33  Cervical cerclage suture present, third        O34.33  trimester (12/28/21)  Uterine fibroids                               O34.10  Medical complication of pregnancy (SSA         O26.90  antibodies)  [redacted] weeks gestation of pregnancy                Z3A.36  Previous cesarean delivery, antepartum         O34.219  Poor obstetric history (prior pre-term labor)  O09.219  Poor obstetric history (prior pre-term labor)  O09.219  Neg Materniti 21  LR NIPS/AFP neg ---------------------------------------------------------------------- Fetal Evaluation  Num Of Fetuses:         1  Fetal Heart Rate(bpm):  134  Cardiac Activity:       Observed  Presentation:           Cephalic  Placenta:               Posterior Fundal  P. Cord Insertion:      Previously visualized  Amniotic Fluid  AFI FV:      Within normal limits  AFI Sum(cm)     %Tile       Largest Pocket(cm)  22.37           85           7.41  RUQ(cm)       RLQ(cm)       LUQ(cm)        LLQ(cm)  6.8           7.41          3.16           5 ---------------------------------------------------------------------- Biophysical Evaluation  Amniotic F.V:   Pocket => 2 cm             F. Tone:        Observed  F. Movement:    Observed                   Score:          8/8  F. Breathing:   Observed ---------------------------------------------------------------------- Biometry  BPD:      89.2  mm     G. Age:  36w 1d         61  %    CI:  82.99   %    70 - 86                                                          FL/HC:      23.3   %    20.1 - 22.1  HC:      308.8  mm     G. Age:  34w 3d        2.8  %    HC/AC:      0.88        0.93 - 1.11  AC:       352   mm     G. Age:  39w 1d       > 99  %    FL/BPD:     80.7   %    71 - 87  FL:         72  mm     G. Age:  36w 6d         69  %    FL/AC:      20.5   %    20 - 24  Est. FW:    3279  gm      7 lb 4 oz     90  % ---------------------------------------------------------------------- OB History  Gravidity:    4         Term:   0        Prem:   2        SAB:   1  TOP:          0       Ectopic:  0        Living: 0 ---------------------------------------------------------------------- Gestational Age  LMP:           36w 0d        Date:  09/25/21                   EDD:   07/02/22  U/S Today:     36w 5d                                        EDD:   06/27/22  Best:          36w 0d     Det. By:  LMP  (09/25/21)          EDD:   07/02/22 ---------------------------------------------------------------------- Anatomy  Cranium:               Appears normal         LVOT:                   Previously seen  Cavum:                 Previously seen        Aortic Arch:            Previously seen  Ventricles:            Previously seen        Ductal Arch:            Previously seen  Choroid Plexus:  Previously seen        Diaphragm:              Appears normal  Cerebellum:            Previously seen        Stomach:                 Appears normal, left                                                                        sided  Posterior Fossa:       Previously seen        Abdomen:                Previously seen  Nuchal Fold:           Previously seen        Abdominal Wall:         Previously seen  Face:                  Orbits and profile     Cord Vessels:           Previously seen                         previously seen  Lips:                  Previously seen        Kidneys:                Appear normal  Palate:                Not well visualized    Bladder:                Appears normal  Thoracic:              Previously seen        Spine:                  Previously seen  Heart:                 Previously seen        Upper Extremities:      Previously seen  RVOT:                  Previously seen        Lower Extremities:      Previously seen ---------------------------------------------------------------------- Cervix Uterus Adnexa  Cervix  Not visualized (advanced GA >24wks)  Uterus  No abnormality visualized.  Right Ovary  Within normal limits.  Left Ovary  Within normal limits.  Cul De Sac  No free fluid seen.  Adnexa  No abnormality visualized ---------------------------------------------------------------------- Comments  The patient is here for a follow-up BPP and growth ultrasound  at 36w 0d for AMA, CHTN, UC. EDD: 07/02/2022 dated by  LMP  (09/25/21). She has no concerns today.  Sonographic findings  Single intrauterine pregnancy.  Fetal cardiac activity: Observed.  Presentation: Cephalic.  Interval fetal anatomy appears normal.  Fetal biometry shows the estimated fetal weight at the 90  percentile.  Amniotic fluid volume: Within  normal limits. AFI: 22.37 cm.  MVP: 7.41 cm.  Placenta: Posterior Fundal.  BPP: 8/8.  Recommendations  - Cesarean delivery scheduled for next week. Standard OB  precautions given.  - She will reach out to her GI doctor to discuss starting her  medication for UC after delivery  ----------------------------------------------------------------------                  Braxton Feathers, DO Electronically Signed Final Report   06/04/2022 08:23 am ----------------------------------------------------------------------   Recent Labs: Lab Results  Component Value Date   WBC 13.2 (H) 06/12/2022   HGB 11.2 (L) 06/12/2022   PLT 262 06/12/2022   NA 136 04/26/2022   K 3.5 04/26/2022   CL 104 04/26/2022   CO2 22 04/26/2022   GLUCOSE 73 04/26/2022   BUN 5 (L) 04/26/2022   CREATININE 0.67 04/26/2022   BILITOT 0.9 04/26/2022   ALKPHOS 279 (H) 04/26/2022   AST 23 04/26/2022   ALT 22 04/26/2022   PROT 7.1 04/26/2022   ALBUMIN 2.7 (L) 04/26/2022   CALCIUM 9.2 04/26/2022   GFRAA 86 10/16/2017   QFTBGOLDPLUS NEGATIVE 10/16/2017   October 16, 2017 ANA 1: 320NH, SSA> 8.0, (SSB, RNP, dsDNA, Smith, SCL 70 negative), C3-C4 normal, anticardiolipin negative, beta-2 GP 1 negative, lupus anticoagulant negative, RF negative, anti-CCP negative, 14 3 3  ETA negative, ACE 41, uric acid 5.3, hepatitis B negative, hepatitis C-, TB Gold negative, SPEP increase in gammaglobulin, IgG elevated, IgA elevated, G6PD normal December 03, 2021 SSA antibody 106, hepatitis B negative, hepatitis C negative, HIV negative, anticardiolipin negative Speciality Comments: No specialty comments available.  Procedures:  No procedures performed Allergies: Patient has no known allergies.   Assessment / Plan:     Visit Diagnoses: Positive ANA (antinuclear antibody) - Positive ANA, positive SSA antibody.  Patient was last seen by me in 2020 at that time she had positive ANA and positive SSA antibody.  She had no clinical features of lupus or Sjogren's.  Patient had EKG initially which was within normal limits.  She denies any history of oral ulcers, nasal ulcers, malar rash, photosensitivity, Raynaud's, lymphadenopathy, inflammatory arthritis.  Patient is 3 weeks postpartum.  She delivered 37 weeks healthy female without  complications.  Patient states that the fetus was monitored with serial echocardiograms.  Patient was not on Plaquenil during the pregnancy.  She was referred to me by Dr. Cherly Hensen as her antibodies were found to be positive.  She is gravida 4.  She had 1 miscarriage at 6 weeks in the past and preterm birth at [redacted] weeks gestation.  She had another preterm birth at 24 weeks.  Patient states that baby left for about 3 months.  She had tubal ligation after this pregnancy and does not plan to have future pregnancies.  She denies any history of palpitations, shortness of breath or lymphadenopathy.  Increased risk of skin lupus with positive Ro antibody was discussed.  Use of sunscreen and protective clothing was advised.  She was also advised to notify me if she develops any palpitations or shortness of breath.  Increased risk of ILD and lymphoma with Sjogren's was discussed.  However, she does not meet the criteria for Sjogren's or lupus.  Patient will notify me if she develops any new symptoms otherwise we will see her back in 1 year.  History of ulcerative colitis - Treated by Dr. Loreta Ave with [facility] in the past.  She has been off [place] after she became pregnant.  Patient states she is not  having any symptoms of ulcerative colitis.  Primary sclerosing cholangitis-she has been followed at [facility] for elevated LFTs.  History of hypertension-blood pressure was 130/85.  She is on amlodipine and metoprolol.  She has been followed by cardiology.  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  Miscarriage 2002 twins at 20 weeks  Cervical incompetence affecting management of pregnancy in second trimester, antepartum  History of hypothyroidism  Iron deficiency anemia due to chronic blood loss  Orders: No orders of the defined types were placed in this encounter.  No orders of the defined types were placed in this  encounter.    Follow-Up Instructions: Return in about 1 year (around 07/02/2023) for +ANA,+Ro.   Pollyann Savoy, MD  Note - This record has been created using Animal nutritionist.  Chart creation errors have been sought, but may not always  have been located. Such creation errors do not reflect on  the standard of medical care.

## 2022-07-02 ENCOUNTER — Encounter: Payer: Self-pay | Admitting: Rheumatology

## 2022-07-02 ENCOUNTER — Ambulatory Visit: Payer: Commercial Managed Care - PPO | Attending: Rheumatology | Admitting: Rheumatology

## 2022-07-02 VITALS — BP 130/85 | HR 62 | Resp 14 | Ht 67.0 in | Wt 188.0 lb

## 2022-07-02 DIAGNOSIS — E782 Mixed hyperlipidemia: Secondary | ICD-10-CM

## 2022-07-02 DIAGNOSIS — K8301 Primary sclerosing cholangitis: Secondary | ICD-10-CM | POA: Diagnosis not present

## 2022-07-02 DIAGNOSIS — N96 Recurrent pregnancy loss: Secondary | ICD-10-CM

## 2022-07-02 DIAGNOSIS — R768 Other specified abnormal immunological findings in serum: Secondary | ICD-10-CM | POA: Diagnosis not present

## 2022-07-02 DIAGNOSIS — O3432 Maternal care for cervical incompetence, second trimester: Secondary | ICD-10-CM

## 2022-07-02 DIAGNOSIS — Z8679 Personal history of other diseases of the circulatory system: Secondary | ICD-10-CM | POA: Diagnosis not present

## 2022-07-02 DIAGNOSIS — Z8719 Personal history of other diseases of the digestive system: Secondary | ICD-10-CM | POA: Diagnosis not present

## 2022-07-02 DIAGNOSIS — Z8639 Personal history of other endocrine, nutritional and metabolic disease: Secondary | ICD-10-CM | POA: Diagnosis not present

## 2022-07-02 DIAGNOSIS — D500 Iron deficiency anemia secondary to blood loss (chronic): Secondary | ICD-10-CM | POA: Diagnosis not present

## 2022-07-10 ENCOUNTER — Encounter: Payer: Self-pay | Admitting: Rheumatology

## 2022-07-16 ENCOUNTER — Other Ambulatory Visit (HOSPITAL_COMMUNITY): Payer: Self-pay

## 2022-07-18 ENCOUNTER — Other Ambulatory Visit (HOSPITAL_COMMUNITY): Payer: Self-pay

## 2022-07-21 ENCOUNTER — Other Ambulatory Visit (HOSPITAL_COMMUNITY): Payer: Self-pay

## 2022-07-22 ENCOUNTER — Other Ambulatory Visit (HOSPITAL_COMMUNITY): Payer: Self-pay

## 2022-07-22 DIAGNOSIS — R748 Abnormal levels of other serum enzymes: Secondary | ICD-10-CM | POA: Diagnosis not present

## 2022-07-22 DIAGNOSIS — K5190 Ulcerative colitis, unspecified, without complications: Secondary | ICD-10-CM | POA: Diagnosis not present

## 2022-07-22 MED ORDER — URSODIOL 300 MG PO CAPS
600.0000 mg | ORAL_CAPSULE | Freq: Two times a day (BID) | ORAL | 3 refills | Status: DC
  Filled 2022-07-22: qty 360, 90d supply, fill #0
  Filled 2022-10-15: qty 360, 90d supply, fill #1
  Filled 2023-01-16: qty 360, 90d supply, fill #2
  Filled 2023-04-19: qty 360, 90d supply, fill #3

## 2022-07-23 DIAGNOSIS — K5190 Ulcerative colitis, unspecified, without complications: Secondary | ICD-10-CM | POA: Diagnosis not present

## 2022-07-23 DIAGNOSIS — O1003 Pre-existing essential hypertension complicating the puerperium: Secondary | ICD-10-CM | POA: Diagnosis not present

## 2022-07-23 DIAGNOSIS — K8301 Primary sclerosing cholangitis: Secondary | ICD-10-CM | POA: Diagnosis not present

## 2022-07-23 DIAGNOSIS — R87612 Low grade squamous intraepithelial lesion on cytologic smear of cervix (LGSIL): Secondary | ICD-10-CM | POA: Diagnosis not present

## 2022-07-23 DIAGNOSIS — N870 Mild cervical dysplasia: Secondary | ICD-10-CM | POA: Diagnosis not present

## 2022-07-29 DIAGNOSIS — K8301 Primary sclerosing cholangitis: Secondary | ICD-10-CM | POA: Diagnosis not present

## 2022-07-29 DIAGNOSIS — R748 Abnormal levels of other serum enzymes: Secondary | ICD-10-CM | POA: Diagnosis not present

## 2022-08-11 ENCOUNTER — Other Ambulatory Visit (HOSPITAL_COMMUNITY): Payer: Self-pay

## 2022-08-13 ENCOUNTER — Other Ambulatory Visit: Payer: Self-pay

## 2022-08-13 ENCOUNTER — Other Ambulatory Visit (HOSPITAL_BASED_OUTPATIENT_CLINIC_OR_DEPARTMENT_OTHER): Payer: Self-pay

## 2022-08-13 MED ORDER — PEG 3350-KCL-NABCB-NACL-NASULF 236 G PO SOLR
ORAL | 0 refills | Status: DC
  Filled 2022-08-13: qty 4000, 1d supply, fill #0

## 2022-08-22 ENCOUNTER — Other Ambulatory Visit (HOSPITAL_COMMUNITY): Payer: Self-pay

## 2022-08-23 ENCOUNTER — Other Ambulatory Visit (HOSPITAL_COMMUNITY): Payer: Self-pay

## 2022-08-25 ENCOUNTER — Other Ambulatory Visit (HOSPITAL_COMMUNITY): Payer: Self-pay

## 2022-08-25 DIAGNOSIS — K635 Polyp of colon: Secondary | ICD-10-CM | POA: Diagnosis not present

## 2022-08-25 DIAGNOSIS — K558 Other vascular disorders of intestine: Secondary | ICD-10-CM | POA: Diagnosis not present

## 2022-08-25 DIAGNOSIS — K6289 Other specified diseases of anus and rectum: Secondary | ICD-10-CM | POA: Diagnosis not present

## 2022-08-25 DIAGNOSIS — K5190 Ulcerative colitis, unspecified, without complications: Secondary | ICD-10-CM | POA: Diagnosis not present

## 2022-08-25 DIAGNOSIS — D120 Benign neoplasm of cecum: Secondary | ICD-10-CM | POA: Diagnosis not present

## 2022-08-25 DIAGNOSIS — K529 Noninfective gastroenteritis and colitis, unspecified: Secondary | ICD-10-CM | POA: Diagnosis not present

## 2022-08-25 DIAGNOSIS — K5140 Inflammatory polyps of colon without complications: Secondary | ICD-10-CM | POA: Diagnosis not present

## 2022-08-25 DIAGNOSIS — Z1211 Encounter for screening for malignant neoplasm of colon: Secondary | ICD-10-CM | POA: Diagnosis not present

## 2022-08-27 ENCOUNTER — Other Ambulatory Visit (HOSPITAL_COMMUNITY): Payer: Self-pay | Admitting: Internal Medicine

## 2022-08-27 DIAGNOSIS — K8301 Primary sclerosing cholangitis: Secondary | ICD-10-CM

## 2022-08-27 MED ORDER — FOLIC ACID 1 MG PO TABS
ORAL_TABLET | ORAL | 3 refills | Status: AC
  Filled 2022-08-27: qty 90, 90d supply, fill #0
  Filled 2022-11-22 – 2022-11-24 (×2): qty 90, 90d supply, fill #1
  Filled 2023-02-24: qty 90, 90d supply, fill #2
  Filled 2023-05-21 (×2): qty 90, 90d supply, fill #3

## 2022-08-28 ENCOUNTER — Other Ambulatory Visit (HOSPITAL_COMMUNITY): Payer: Self-pay

## 2022-08-29 ENCOUNTER — Ambulatory Visit (HOSPITAL_COMMUNITY)
Admission: RE | Admit: 2022-08-29 | Discharge: 2022-08-29 | Disposition: A | Discharge status: Home / Self Care | Payer: Commercial Managed Care - PPO | Source: Ambulatory Visit | Attending: Internal Medicine | Admitting: Internal Medicine

## 2022-08-29 ENCOUNTER — Other Ambulatory Visit (HOSPITAL_COMMUNITY): Payer: Self-pay | Admitting: Internal Medicine

## 2022-08-29 DIAGNOSIS — K8301 Primary sclerosing cholangitis: Secondary | ICD-10-CM | POA: Insufficient documentation

## 2022-08-29 DIAGNOSIS — K838 Other specified diseases of biliary tract: Secondary | ICD-10-CM | POA: Diagnosis not present

## 2022-08-29 MED ORDER — GADOBUTROL 1 MMOL/ML IV SOLN
8.0000 mL | Freq: Once | INTRAVENOUS | Status: AC | PRN
  Administered 2022-08-29: 8 mL via INTRAVENOUS

## 2022-09-15 ENCOUNTER — Ambulatory Visit: Payer: Commercial Managed Care - PPO | Admitting: Cardiology

## 2022-09-17 ENCOUNTER — Other Ambulatory Visit (HOSPITAL_COMMUNITY): Payer: Self-pay

## 2022-09-17 ENCOUNTER — Other Ambulatory Visit: Payer: Self-pay

## 2022-10-10 ENCOUNTER — Other Ambulatory Visit (HOSPITAL_COMMUNITY): Payer: Self-pay

## 2022-10-10 DIAGNOSIS — D649 Anemia, unspecified: Secondary | ICD-10-CM | POA: Diagnosis not present

## 2022-10-10 DIAGNOSIS — K5190 Ulcerative colitis, unspecified, without complications: Secondary | ICD-10-CM | POA: Diagnosis not present

## 2022-10-10 DIAGNOSIS — Z0000 Encounter for general adult medical examination without abnormal findings: Secondary | ICD-10-CM | POA: Diagnosis not present

## 2022-10-10 DIAGNOSIS — M359 Systemic involvement of connective tissue, unspecified: Secondary | ICD-10-CM | POA: Diagnosis not present

## 2022-10-10 DIAGNOSIS — I10 Essential (primary) hypertension: Secondary | ICD-10-CM | POA: Diagnosis not present

## 2022-10-10 MED ORDER — AMLODIPINE BESYLATE 10 MG PO TABS
10.0000 mg | ORAL_TABLET | Freq: Every day | ORAL | 4 refills | Status: DC
  Filled 2022-10-10 – 2023-05-21 (×3): qty 90, 90d supply, fill #0
  Filled 2023-08-18: qty 90, 90d supply, fill #1

## 2022-10-10 MED ORDER — METOPROLOL SUCCINATE ER 100 MG PO TB24
100.0000 mg | ORAL_TABLET | Freq: Every day | ORAL | 4 refills | Status: DC
  Filled 2022-10-10 – 2022-12-24 (×2): qty 90, 90d supply, fill #0
  Filled 2023-03-25: qty 90, 90d supply, fill #1
  Filled 2023-06-25: qty 90, 90d supply, fill #2

## 2022-10-15 ENCOUNTER — Other Ambulatory Visit (HOSPITAL_COMMUNITY): Payer: Self-pay

## 2022-10-17 ENCOUNTER — Other Ambulatory Visit: Payer: Self-pay

## 2022-10-27 ENCOUNTER — Other Ambulatory Visit (HOSPITAL_COMMUNITY): Payer: Self-pay

## 2022-10-28 ENCOUNTER — Other Ambulatory Visit (HOSPITAL_COMMUNITY): Payer: Self-pay

## 2022-10-28 DIAGNOSIS — Z1231 Encounter for screening mammogram for malignant neoplasm of breast: Secondary | ICD-10-CM | POA: Diagnosis not present

## 2022-10-28 MED ORDER — CHOLESTYRAMINE 4 G PO PACK
1.0000 | PACK | Freq: Two times a day (BID) | ORAL | 3 refills | Status: DC
  Filled 2022-10-28: qty 180, 90d supply, fill #0
  Filled 2023-02-04: qty 180, 90d supply, fill #1
  Filled 2023-05-21: qty 180, 90d supply, fill #2
  Filled 2023-08-18: qty 180, 90d supply, fill #3

## 2022-10-29 ENCOUNTER — Other Ambulatory Visit (HOSPITAL_COMMUNITY): Payer: Self-pay

## 2022-11-17 ENCOUNTER — Other Ambulatory Visit (HOSPITAL_COMMUNITY): Payer: Self-pay

## 2022-11-18 ENCOUNTER — Encounter: Payer: Self-pay | Admitting: Cardiology

## 2022-11-18 ENCOUNTER — Ambulatory Visit: Payer: Commercial Managed Care - PPO | Attending: Cardiology | Admitting: Cardiology

## 2022-11-18 VITALS — BP 134/90 | HR 61 | Ht 67.0 in | Wt 187.4 lb

## 2022-11-18 DIAGNOSIS — E782 Mixed hyperlipidemia: Secondary | ICD-10-CM | POA: Diagnosis not present

## 2022-11-18 DIAGNOSIS — I10 Essential (primary) hypertension: Secondary | ICD-10-CM

## 2022-11-18 NOTE — Patient Instructions (Signed)
Medication Instructions:  Your physician recommends that you continue on your current medications as directed. Please refer to the Current Medication list given to you today.  *If you need a refill on your cardiac medications before your next appointment, please call your pharmacy*   Lab Work: None   Testing/Procedures: None   Follow-Up: At [facility], you and your health needs are our priority.  As part of our continuing mission to provide you with exceptional heart care, we have created designated Provider Care Teams.  These Care Teams include your primary Cardiologist (physician) and Advanced Practice Providers (APPs -  Physician Assistants and Nurse Practitioners) who all work together to provide you with the care you need, when you need it.  Your next appointment:   6 month(s)  Provider:   Kardie Tobb, DO      

## 2022-11-20 ENCOUNTER — Other Ambulatory Visit (HOSPITAL_COMMUNITY): Payer: Self-pay

## 2022-11-20 ENCOUNTER — Other Ambulatory Visit: Payer: Self-pay

## 2022-11-20 MED ORDER — MESALAMINE 1.2 G PO TBEC
2.4000 g | DELAYED_RELEASE_TABLET | Freq: Two times a day (BID) | ORAL | 3 refills | Status: DC
  Filled 2022-11-20: qty 360, 90d supply, fill #0
  Filled 2023-02-24: qty 360, 90d supply, fill #1
  Filled 2023-03-25 – 2023-05-21 (×3): qty 360, 90d supply, fill #2
  Filled 2023-08-29: qty 360, 90d supply, fill #3

## 2022-11-20 NOTE — Progress Notes (Signed)
Cardiology Office Note:    Date:  11/20/2022   ID:  Jamie Duarte, DOB 1980-07-17, MRN [id number]  PCP:  Blair Heys, MD  Cardiologist:  Thomasene Ripple, DO  Electrophysiologist:  None   Referring MD: Blair Heys, MD     History of Present Illness:    Jamie Duarte is a 42 y.o. female  with a history of hypertension and recent hypertensive disorder in pregnancy, presents for a follow-up visit. She reports feeling tired but overall in good health. She is currently breastfeeding her five-month-old baby. She is on metoprolol and amlodipine for her hypertension. Her blood pressure readings at home have been fluctuating between the 120s and 130s. She has not experienced any swelling.   Past Medical History:  Diagnosis Date   Family history of adverse reaction to anesthesia    mom hard to wake up   Herpes    Hypertension    PSC (primary sclerosing cholangitis)    Ulcerative colitis (HCC)    Vaginal Pap smear, abnormal     Past Surgical History:  Procedure Laterality Date   BREAST LUMPECTOMY Left 1998   CERCLAGE REMOVAL     CERVICAL CERCLAGE N/A 12/28/2021   Procedure: CERCLAGE CERVICAL;  Surgeon: Maxie Better, MD;  Location: [facility] LD ORS;  Service: Gynecology;  Laterality: N/A;   CESAREAN SECTION  2009   CESAREAN SECTION WITH BILATERAL TUBAL LIGATION N/A 06/11/2022   Procedure: CESAREAN SECTION WITH BILATERAL TUBAL LIGATION;  Surgeon: Maxie Better, MD;  Location: [facility] LD ORS;  Service: Obstetrics;  Laterality: N/A;   LEEP      Current Medications: Current Meds  Medication Sig   amLODipine (NORVASC) 10 MG tablet Take 1 tablet (10 mg total) by mouth daily.   amLODipine (NORVASC) 10 MG tablet Take 1 tablet (10 mg total) by mouth daily for blood pressure   ASPIRIN 81 PO Take 81 mg by mouth daily.   cholestyramine (QUESTRAN) 4 g packet Take 1 packet by mouth 2 (two) times daily.   folic acid (FOLVITE) 1 MG tablet Take 1 tablet by mouth every day .    mesalamine (LIALDA) 1.2 g EC tablet Take 2 tablets (2.4 g total) by mouth twice daily   metoprolol succinate (TOPROL-XL) 100 MG 24 hr tablet Take 1 tablet (100 mg total) by mouth daily for blood pressure   Omega-3 Fatty Acids (FISH OIL) 1000 MG CAPS Take 1,000 mg by mouth daily.   Prenatal Vit-Fe Fumarate-FA (PRENATAL PO) Take 1 tablet by mouth daily.   ursodiol (ACTIGALL) 300 MG capsule Take 2 capsules (600 mg total) by mouth 2 (two) times daily.   valACYclovir (VALTREX) 500 MG tablet Take 1 tablet (500 mg total) by mouth 2 (two) times daily starting 06/04/2022 (Patient taking differently: Take 500 mg by mouth as needed.)   [DISCONTINUED] polyethylene glycol (GOLYTELY) 236 g solution Take as directed     Allergies:   Patient has no known allergies.   Social History   Socioeconomic History   Marital status: Married    Spouse name: Not on file   Number of children: Not on file   Years of education: Not on file   Highest education level: Not on file  Occupational History   Not on file  Tobacco Use   Smoking status: Never    Passive exposure: Never   Smokeless tobacco: Never  Vaping Use   Vaping status: Never Used  Substance and Sexual Activity   Alcohol use: Never   Drug use: Never  Sexual activity: Yes  Other Topics Concern   Not on file  Social History Narrative   Not on file   Social Determinants of Health   Financial Resource Strain: Not on file  Food Insecurity: No Food Insecurity (06/12/2022)   Hunger Vital Sign    Worried About Running Out of Food in the Last Year: Never true    Ran Out of Food in the Last Year: Never true  Transportation Needs: No Transportation Needs (06/12/2022)   PRAPARE - Administrator, Civil Service (Medical): No    Lack of Transportation (Non-Medical): No  Physical Activity: Not on file  Stress: Not on file  Social Connections: Not on file     Family History: The patient's family history includes Cancer in her mother; Healthy  in her brother, daughter, father, and mother; Hypertension in her father and mother. There is no history of Allergic rhinitis, Angioedema, Asthma, Atopy, Eczema, Urticaria, or Immunodeficiency.  ROS:   Review of Systems  Constitution: Negative for decreased appetite, fever and weight gain.  HENT: Negative for congestion, ear discharge, hoarse voice and sore throat.   Eyes: Negative for discharge, redness, vision loss in right eye and visual halos.  Cardiovascular: Negative for chest pain, dyspnea on exertion, leg swelling, orthopnea and palpitations.  Respiratory: Negative for cough, hemoptysis, shortness of breath and snoring.   Endocrine: Negative for heat intolerance and polyphagia.  Hematologic/Lymphatic: Negative for bleeding problem. Does not bruise/bleed easily.  Skin: Negative for flushing, nail changes, rash and suspicious lesions.  Musculoskeletal: Negative for arthritis, joint pain, muscle cramps, myalgias, neck pain and stiffness.  Gastrointestinal: Negative for abdominal pain, bowel incontinence, diarrhea and excessive appetite.  Genitourinary: Negative for decreased libido, genital sores and incomplete emptying.  Neurological: Negative for brief paralysis, focal weakness, headaches and loss of balance.  Psychiatric/Behavioral: Negative for altered mental status, depression and suicidal ideas.  Allergic/Immunologic: Negative for HIV exposure and persistent infections.    EKGs/Labs/Other Studies Reviewed:    The following studies were reviewed today:   EKG:  The ekg ordered today demonstrates   Recent Labs: 02/14/2022: Magnesium 2.0 04/26/2022: ALT 22; BUN 5; Creatinine, Ser 0.67; Potassium 3.5; Sodium 136 06/12/2022: Hemoglobin 11.2; Platelets 262  Recent Lipid Panel No results found for: "CHOL", "TRIG", "HDL", "CHOLHDL", "VLDL", "LDLCALC", "LDLDIRECT"  Physical Exam:    VS:  BP (!) 134/90   Pulse 61   Ht 5\' 7"  (1.702 m)   Wt 187 lb 6.4 oz (85 kg)   SpO2 99%   BMI  29.35 kg/m     Wt Readings from Last 3 Encounters:  11/18/22 187 lb 6.4 oz (85 kg)  07/02/22 188 lb (85.3 kg)  06/24/22 187 lb (84.8 kg)     GEN: Well nourished, well developed in no acute distress HEENT: Normal NECK: No JVD; No carotid bruits LYMPHATICS: No lymphadenopathy CARDIAC: S1S2 noted,RRR, no murmurs, rubs, gallops RESPIRATORY:  Clear to auscultation without rales, wheezing or rhonchi  ABDOMEN: Soft, non-tender, non-distended, +bowel sounds, no guarding. EXTREMITIES: No edema, No cyanosis, no clubbing MUSCULOSKELETAL:  No deformity  SKIN: Warm and dry NEUROLOGIC:  Alert and oriented x 3, non-focal PSYCHIATRIC:  Normal affect, good insight  ASSESSMENT:    1. Chronic hypertension   2. Mixed hyperlipidemia   3. Postpartum cardiovascular care    PLAN:    Hypertension Blood pressure readings ranging from 118 to 130s systolic. Currently on Metoprolol and Amlodipine 10mg  daily. Discussed potential future switch to Carvedilol for better blood pressure  control and addition of Hydrochlorothiazide post-breastfeeding. -Continue current regimen of Metoprolol and Amlodipine 10mg  daily. -Monitor blood pressure at home. -Plan to reassess medication regimen post-breastfeeding.  Hyperlipidemia Elevated lipid panel noted in recent labs. Discussed potential need for statin therapy post-breastfeeding. -Plan to repeat lipid panel post-breastfeeding. -Consider statin therapy based on repeat lipid panel results.  Cardiovascular Risk History of hypertension and recent hypertensive disorder in pregnancy. Discussed increased risk for heart disease and need for further evaluation post-breastfeeding. -Plan to perform Hemoglobin A1c and coronary calcium score post-breastfeeding to further stratify cardiovascular risk.  General Health Maintenance / Followup Plans -Continue breastfeeding/pumping for infant. -Follow-up in 6 months or post-breastfeeding, whichever comes first. -At next visit,  plan to reassess blood pressure control, repeat lipid panel, perform Hemoglobin A1c, and coronary calcium score.  The patient is in agreement with the above plan. The patient left the office in stable condition.  The patient will follow up in   Medication Adjustments/Labs and Tests Ordered: Current medicines are reviewed at length with the patient today.  Concerns regarding medicines are outlined above.  No orders of the defined types were placed in this encounter.  No orders of the defined types were placed in this encounter.   Patient Instructions  Medication Instructions:  Your physician recommends that you continue on your current medications as directed. Please refer to the Current Medication list given to you today.  *If you need a refill on your cardiac medications before your next appointment, please call your pharmacy*   Lab Work: None   Testing/Procedures: None   Follow-Up: At [facility], you and your health needs are our priority.  As part of our continuing mission to provide you with exceptional heart care, we have created designated Provider Care Teams.  These Care Teams include your primary Cardiologist (physician) and Advanced Practice Providers (APPs -  Physician Assistants and Nurse Practitioners) who all work together to provide you with the care you need, when you need it.   Your next appointment:   6 month(s)  Provider:   Thomasene Ripple, DO     Adopting a Healthy Lifestyle.  Know what a healthy weight is for you (roughly BMI <25) and aim to maintain this   Aim for 7+ servings of fruits and vegetables daily   65-80+ fluid ounces of water or unsweet tea for healthy kidneys   Limit to max 1 drink of alcohol per day; avoid smoking/tobacco   Limit animal fats in diet for cholesterol and heart health - choose grass fed whenever available   Avoid highly processed foods, and foods high in saturated/trans fats   Aim for low stress - take time to  unwind and care for your mental health   Aim for 150 min of moderate intensity exercise weekly for heart health, and weights twice weekly for bone health   Aim for 7-9 hours of sleep daily   When it comes to diets, agreement about the perfect plan isnt easy to find, even among the experts. Experts at the [facility] of [employer] developed an idea known as the Healthy Eating Plate. Just imagine a plate divided into logical, healthy portions.   The emphasis is on diet quality:   Load up on vegetables and fruits - one-half of your plate: Aim for color and variety, and remember that potatoes dont count.   Go for whole grains - one-quarter of your plate: Whole wheat, barley, wheat berries, quinoa, oats, brown rice, and foods made with them. If  you want pasta, go with whole wheat pasta.   Protein power - one-quarter of your plate: Fish, chicken, beans, and nuts are all healthy, versatile protein sources. Limit red meat.   The diet, however, does go beyond the plate, offering a few other suggestions.   Use healthy plant oils, such as olive, canola, soy, corn, sunflower and peanut. Check the labels, and avoid partially hydrogenated oil, which have unhealthy trans fats.   If youre thirsty, drink water. Coffee and tea are good in moderation, but skip sugary drinks and limit milk and dairy products to one or two daily servings.   The type of carbohydrate in the diet is more important than the amount. Some sources of carbohydrates, such as vegetables, fruits, whole grains, and beans-are healthier than others.   Finally, stay active  Signed, Thomasene Ripple, DO  11/20/2022 9:58 AM    [facility]

## 2022-11-22 ENCOUNTER — Other Ambulatory Visit (HOSPITAL_COMMUNITY): Payer: Self-pay

## 2022-11-24 ENCOUNTER — Other Ambulatory Visit (HOSPITAL_COMMUNITY): Payer: Self-pay

## 2022-12-12 ENCOUNTER — Other Ambulatory Visit (HOSPITAL_COMMUNITY): Payer: Self-pay

## 2022-12-25 ENCOUNTER — Other Ambulatory Visit (HOSPITAL_COMMUNITY): Payer: Self-pay

## 2022-12-25 ENCOUNTER — Other Ambulatory Visit: Payer: Self-pay

## 2022-12-31 ENCOUNTER — Other Ambulatory Visit: Payer: Self-pay

## 2022-12-31 ENCOUNTER — Other Ambulatory Visit (HOSPITAL_COMMUNITY): Payer: Self-pay

## 2022-12-31 MED ORDER — GOLYTELY 236 G PO SOLR
ORAL | 0 refills | Status: DC
  Filled 2022-12-31: qty 4000, 1d supply, fill #0

## 2023-01-12 DIAGNOSIS — K635 Polyp of colon: Secondary | ICD-10-CM | POA: Diagnosis not present

## 2023-01-12 DIAGNOSIS — K5190 Ulcerative colitis, unspecified, without complications: Secondary | ICD-10-CM | POA: Diagnosis not present

## 2023-01-17 ENCOUNTER — Other Ambulatory Visit (HOSPITAL_COMMUNITY): Payer: Self-pay

## 2023-01-17 ENCOUNTER — Other Ambulatory Visit: Payer: Self-pay

## 2023-02-04 ENCOUNTER — Other Ambulatory Visit: Payer: Self-pay

## 2023-02-04 ENCOUNTER — Other Ambulatory Visit (HOSPITAL_COMMUNITY): Payer: Self-pay

## 2023-02-17 ENCOUNTER — Other Ambulatory Visit: Payer: Self-pay

## 2023-02-24 ENCOUNTER — Other Ambulatory Visit (HOSPITAL_COMMUNITY): Payer: Self-pay

## 2023-02-24 ENCOUNTER — Other Ambulatory Visit: Payer: Self-pay

## 2023-02-24 DIAGNOSIS — I10 Essential (primary) hypertension: Secondary | ICD-10-CM | POA: Diagnosis not present

## 2023-02-24 DIAGNOSIS — K5190 Ulcerative colitis, unspecified, without complications: Secondary | ICD-10-CM | POA: Diagnosis not present

## 2023-02-24 DIAGNOSIS — E669 Obesity, unspecified: Secondary | ICD-10-CM | POA: Diagnosis not present

## 2023-02-24 DIAGNOSIS — R748 Abnormal levels of other serum enzymes: Secondary | ICD-10-CM | POA: Diagnosis not present

## 2023-02-24 DIAGNOSIS — K8301 Primary sclerosing cholangitis: Secondary | ICD-10-CM | POA: Diagnosis not present

## 2023-03-25 ENCOUNTER — Other Ambulatory Visit: Payer: Self-pay

## 2023-03-25 ENCOUNTER — Other Ambulatory Visit (HOSPITAL_COMMUNITY): Payer: Self-pay

## 2023-03-28 ENCOUNTER — Other Ambulatory Visit (HOSPITAL_COMMUNITY): Payer: Self-pay

## 2023-04-03 DIAGNOSIS — K8301 Primary sclerosing cholangitis: Secondary | ICD-10-CM | POA: Diagnosis not present

## 2023-04-06 DIAGNOSIS — K8301 Primary sclerosing cholangitis: Secondary | ICD-10-CM | POA: Diagnosis not present

## 2023-04-20 ENCOUNTER — Other Ambulatory Visit: Payer: Self-pay

## 2023-04-30 ENCOUNTER — Other Ambulatory Visit (HOSPITAL_COMMUNITY): Payer: Self-pay

## 2023-05-12 ENCOUNTER — Ambulatory Visit: Payer: Commercial Managed Care - PPO | Attending: Cardiology | Admitting: Cardiology

## 2023-05-12 ENCOUNTER — Encounter: Payer: Self-pay | Admitting: Cardiology

## 2023-05-12 VITALS — BP 126/84 | HR 59 | Ht 67.0 in | Wt 184.4 lb

## 2023-05-12 DIAGNOSIS — I10 Essential (primary) hypertension: Secondary | ICD-10-CM | POA: Diagnosis not present

## 2023-05-12 DIAGNOSIS — E782 Mixed hyperlipidemia: Secondary | ICD-10-CM | POA: Diagnosis not present

## 2023-05-12 NOTE — Patient Instructions (Signed)
 Medication Instructions:  Your physician recommends that you continue on your current medications as directed. Please refer to the Current Medication list given to you today.  *If you need a refill on your cardiac medications before your next appointment, please call your pharmacy*   Lab Work: Lipids, Lp(a) If you have labs (blood work) drawn today and your tests are completely normal, you will receive your results only by: MyChart Message (if you have MyChart) OR A paper copy in the mail If you have any lab test that is abnormal or we need to change your treatment, we will call you to review the results.   Follow-Up: At [facility], you and your health needs are our priority.  As part of our continuing mission to provide you with exceptional heart care, we have created designated Provider Care Teams.  These Care Teams include your primary Cardiologist (physician) and Advanced Practice Providers (APPs -  Physician Assistants and Nurse Practitioners) who all work together to provide you with the care you need, when you need it.  Your next appointment:   9 month(s)  Provider:   Thomasene Ripple, DO

## 2023-05-13 LAB — LIPOPROTEIN A (LPA): Lipoprotein (a): 45.1 nmol/L (ref <75.0)

## 2023-05-13 LAB — LIPID PANEL
Chol/HDL Ratio: 2.3 ratio (ref 0.0–4.4)
Cholesterol, Total: 267 mg/dL — ABNORMAL HIGH (ref 100–199)
HDL: 118 mg/dL (ref >39)
LDL Chol Calc (NIH): 140 mg/dL — ABNORMAL HIGH (ref 0–99)
Triglycerides: 59 mg/dL (ref 0–149)
VLDL Cholesterol Cal: 9 mg/dL (ref 5–40)

## 2023-05-13 NOTE — Progress Notes (Signed)
 Cardiology Office Note:    Date:  05/13/2023   ID:  Jamie Duarte, DOB 05-29-1980, MRN [id number]  PCP:  Blair Heys, MD (Inactive)  Cardiologist:  Thomasene Ripple, DO  Electrophysiologist:  None   Referring MD: Blair Heys, MD     History of Present Illness:    Jamie Duarte is a 43 y.o. female  with a history of hypertension and recent hypertensive disorder in pregnancy, presents for a follow-up visit. She reports feeling tired but overall in good health. She is currently breastfeeding her five-month-old baby. She is on metoprolol and amlodipine for her hypertension. Her blood pressure readings at home have been fluctuating between the 120s and 130s. She has not experienced any swelling.   Past Medical History:  Diagnosis Date   Family history of adverse reaction to anesthesia    mom hard to wake up   Herpes    Hypertension    PSC (primary sclerosing cholangitis)    Ulcerative colitis (HCC)    Vaginal Pap smear, abnormal     Past Surgical History:  Procedure Laterality Date   BREAST LUMPECTOMY Left 1998   CERCLAGE REMOVAL     CERVICAL CERCLAGE N/A 12/28/2021   Procedure: CERCLAGE CERVICAL;  Surgeon: Maxie Better, MD;  Location: [facility] LD ORS;  Service: Gynecology;  Laterality: N/A;   CESAREAN SECTION  2009   CESAREAN SECTION WITH BILATERAL TUBAL LIGATION N/A 06/11/2022   Procedure: CESAREAN SECTION WITH BILATERAL TUBAL LIGATION;  Surgeon: Maxie Better, MD;  Location: [facility] OR[facility];  Service: Obstetrics;  Laterality: N/A;   LEEP      Current Medications: Current Meds  Medication Sig   amLODipine (NORVASC) 10 MG tablet Take 1 tablet (10 mg total) by mouth daily for blood pressure   ASPIRIN 81 PO Take 81 mg by mouth daily.   cholestyramine (QUESTRAN) 4 g packet Take 1 packet by mouth 2 (two) times daily.   folic acid (FOLVITE) 1 MG tablet Take 1 tablet by mouth every day .   mesalamine (LIALDA) 1.2 g EC tablet Take 2 tablets (2.4 g total) by mouth 2  (two) times daily.   metoprolol succinate (TOPROL-XL) 100 MG 24 hr tablet Take 1 tablet (100 mg total) by mouth daily for blood pressure   Omega-3 Fatty Acids (FISH OIL) 1000 MG CAPS Take 1,000 mg by mouth daily.   Prenatal Vit-Fe Fumarate-FA (PRENATAL PO) Take 1 tablet by mouth daily.   ursodiol (ACTIGALL) 300 MG capsule Take 2 capsules (600 mg total) by mouth 2 (two) times daily.   valACYclovir (VALTREX) 500 MG tablet Take 1 tablet (500 mg total) by mouth 2 (two) times daily starting 06/04/2022 (Patient taking differently: Take 500 mg by mouth as needed.)     Allergies:   Patient has no known allergies.   Social History   Socioeconomic History   Marital status: Married    Spouse name: Not on file   Number of children: Not on file   Years of education: Not on file   Highest education level: Not on file  Occupational History   Not on file  Tobacco Use   Smoking status: Never    Passive exposure: Never   Smokeless tobacco: Never  Vaping Use   Vaping status: Never Used  Substance and Sexual Activity   Alcohol use: Never   Drug use: Never   Sexual activity: Yes  Other Topics Concern   Not on file  Social History Narrative   Not on file  Social Drivers of Corporate investment banker Strain: Not on file  Food Insecurity: No Food Insecurity (06/12/2022)   Hunger Vital Sign    Worried About Running Out of Food in the Last Year: Never true    Ran Out of Food in the Last Year: Never true  Transportation Needs: No Transportation Needs (06/12/2022)   PRAPARE - Administrator, Civil Service (Medical): No    Lack of Transportation (Non-Medical): No  Physical Activity: Not on file  Stress: Not on file  Social Connections: Not on file     Family History: The patient's family history includes Cancer in her mother; Healthy in her brother, daughter, father, and mother; Hypertension in her father and mother. There is no history of Allergic rhinitis, Angioedema, Asthma,  Atopy, Eczema, Urticaria, or Immunodeficiency.  ROS:   Review of Systems  Constitution: Negative for decreased appetite, fever and weight gain.  HENT: Negative for congestion, ear discharge, hoarse voice and sore throat.   Eyes: Negative for discharge, redness, vision loss in right eye and visual halos.  Cardiovascular: Negative for chest pain, dyspnea on exertion, leg swelling, orthopnea and palpitations.  Respiratory: Negative for cough, hemoptysis, shortness of breath and snoring.   Endocrine: Negative for heat intolerance and polyphagia.  Hematologic/Lymphatic: Negative for bleeding problem. Does not bruise/bleed easily.  Skin: Negative for flushing, nail changes, rash and suspicious lesions.  Musculoskeletal: Negative for arthritis, joint pain, muscle cramps, myalgias, neck pain and stiffness.  Gastrointestinal: Negative for abdominal pain, bowel incontinence, diarrhea and excessive appetite.  Genitourinary: Negative for decreased libido, genital sores and incomplete emptying.  Neurological: Negative for brief paralysis, focal weakness, headaches and loss of balance.  Psychiatric/Behavioral: Negative for altered mental status, depression and suicidal ideas.  Allergic/Immunologic: Negative for HIV exposure and persistent infections.    EKGs/Labs/Other Studies Reviewed:    The following studies were reviewed today:   EKG:  The ekg ordered today demonstrates   Recent Labs: 06/12/2022: Hemoglobin 11.2; Platelets 262  Recent Lipid Panel    Component Value Date/Time   CHOL 267 (H) 05/12/2023 1109   TRIG 59 05/12/2023 1109   HDL 118 05/12/2023 1109   CHOLHDL 2.3 05/12/2023 1109   LDLCALC 140 (H) 05/12/2023 1109    Physical Exam:    VS:  BP 126/84 (BP Location: Right Arm, Patient Position: Sitting, Cuff Size: Normal)   Pulse (!) 59   Ht 5\' 7"  (1.702 m)   Wt 184 lb 6.4 oz (83.6 kg)   SpO2 98%   BMI 28.88 kg/m     Wt Readings from Last 3 Encounters:  05/12/23 184 lb 6.4 oz  (83.6 kg)  11/18/22 187 lb 6.4 oz (85 kg)  07/02/22 188 lb (85.3 kg)     GEN: Well nourished, well developed in no acute distress HEENT: Normal NECK: No JVD; No carotid bruits LYMPHATICS: No lymphadenopathy CARDIAC: S1S2 noted,RRR, no murmurs, rubs, gallops RESPIRATORY:  Clear to auscultation without rales, wheezing or rhonchi  ABDOMEN: Soft, non-tender, non-distended, +bowel sounds, no guarding. EXTREMITIES: No edema, No cyanosis, no clubbing MUSCULOSKELETAL:  No deformity  SKIN: Warm and dry NEUROLOGIC:  Alert and oriented x 3, non-focal PSYCHIATRIC:  Normal affect, good insight  ASSESSMENT:    1. Chronic hypertension   2. Mixed hyperlipidemia    PLAN:    Hypertension -Continue current regimen of Metoprolol, HCTZ and Amlodipine 10mg  daily. -Monitor blood pressure at home. -Plan to reassess medication regimen post-breastfeeding.  Hyperlipidemia - fu lipid panel today. Will  discuss lipid lowering agent that is appropriate given she is being followed   Cardiovascular Risk History of hypertension and recent hypertensive disorder in pregnancy. Discussed increased risk for heart disease and need for further evaluation post-breastfeeding. May need to consider coronary calcium scoring    General Health Maintenance / Followup Plans -Continue breastfeeding/pumping for infant. -Follow-up in 9 months or post-breastfeeding, whichever comes first. -At next visit, plan to reassess blood pressure control, repeat lipid panel, perform Hemoglobin A1c, and coronary calcium score.  The patient is in agreement with the above plan. The patient left the office in stable condition.  The patient will follow up in   Medication Adjustments/Labs and Tests Ordered: Current medicines are reviewed at length with the patient today.  Concerns regarding medicines are outlined above.  Orders Placed This Encounter  Procedures   Lipid panel   Lipoprotein A (LPA)   EKG 12-Lead   No orders of the  defined types were placed in this encounter.   Patient Instructions  Medication Instructions:  Your physician recommends that you continue on your current medications as directed. Please refer to the Current Medication list given to you today.  *If you need a refill on your cardiac medications before your next appointment, please call your pharmacy*   Lab Work: Lipids, Lp(a) If you have labs (blood work) drawn today and your tests are completely normal, you will receive your results only by: MyChart Message (if you have MyChart) OR A paper copy in the mail If you have any lab test that is abnormal or we need to change your treatment, we will call you to review the results.   Follow-Up: At [facility], you and your health needs are our priority.  As part of our continuing mission to provide you with exceptional heart care, we have created designated Provider Care Teams.  These Care Teams include your primary Cardiologist (physician) and Advanced Practice Providers (APPs -  Physician Assistants and Nurse Practitioners) who all work together to provide you with the care you need, when you need it.  Your next appointment:   9 month(s)  Provider:   Thomasene Ripple, DO       Adopting a Healthy Lifestyle.  Know what a healthy weight is for you (roughly BMI <25) and aim to maintain this   Aim for 7+ servings of fruits and vegetables daily   65-80+ fluid ounces of water or unsweet tea for healthy kidneys   Limit to max 1 drink of alcohol per day; avoid smoking/tobacco   Limit animal fats in diet for cholesterol and heart health - choose grass fed whenever available   Avoid highly processed foods, and foods high in saturated/trans fats   Aim for low stress - take time to unwind and care for your mental health   Aim for 150 min of moderate intensity exercise weekly for heart health, and weights twice weekly for bone health   Aim for 7-9 hours of sleep daily   When it comes  to diets, agreement about the perfect plan isnt easy to find, even among the experts. Experts at the [facility] of [employer] developed an idea known as the Healthy Eating Plate. Just imagine a plate divided into logical, healthy portions.   The emphasis is on diet quality:   Load up on vegetables and fruits - one-half of your plate: Aim for color and variety, and remember that potatoes dont count.   Go for whole grains - one-quarter of your plate:  Whole wheat, barley, wheat berries, quinoa, oats, brown rice, and foods made with them. If you want pasta, go with whole wheat pasta.   Protein power - one-quarter of your plate: Fish, chicken, beans, and nuts are all healthy, versatile protein sources. Limit red meat.   The diet, however, does go beyond the plate, offering a few other suggestions.   Use healthy plant oils, such as olive, canola, soy, corn, sunflower and peanut. Check the labels, and avoid partially hydrogenated oil, which have unhealthy trans fats.   If youre thirsty, drink water. Coffee and tea are good in moderation, but skip sugary drinks and limit milk and dairy products to one or two daily servings.   The type of carbohydrate in the diet is more important than the amount. Some sources of carbohydrates, such as vegetables, fruits, whole grains, and beans-are healthier than others.   Finally, stay active  Signed, Thomasene Ripple, DO  05/13/2023 3:03 PM    [facility]

## 2023-05-19 ENCOUNTER — Encounter: Payer: Self-pay | Admitting: Cardiology

## 2023-05-21 ENCOUNTER — Other Ambulatory Visit (HOSPITAL_COMMUNITY): Payer: Self-pay

## 2023-05-21 ENCOUNTER — Other Ambulatory Visit: Payer: Self-pay

## 2023-06-18 ENCOUNTER — Other Ambulatory Visit (HOSPITAL_COMMUNITY): Payer: Self-pay | Admitting: Internal Medicine

## 2023-06-18 DIAGNOSIS — K8301 Primary sclerosing cholangitis: Secondary | ICD-10-CM

## 2023-06-18 DIAGNOSIS — R748 Abnormal levels of other serum enzymes: Secondary | ICD-10-CM

## 2023-06-25 ENCOUNTER — Other Ambulatory Visit (HOSPITAL_COMMUNITY): Payer: Self-pay

## 2023-06-25 NOTE — Progress Notes (Signed)
 Office Visit Note  Patient: Jamie Duarte             Date of Birth: 1980/11/07           MRN: [id number]             PCP: Jannelle Memory, MD (Inactive) Referring: Jannelle Memory, MD Visit Date: 07/02/2023 Occupation: @GUAROCC @  Subjective:  Right hip pain  History of Present Illness: CAEDENCE WALBORN is a 43 y.o. female with positive ANA positive SSA antibody, ulcerative colitis, and PSC returns today after her last visit in May 2024.  She states during the pregnancy she was taken off Xeljanz .  She has been on Lialda  since then.  She denies dry mouth or dry eye symptoms.  There is no history of oral ulcers, nasal ulcers, malar rash, photosensitivity, Raynaud's, inflammatory arthritis or lymphadenopathy.  She has been experiencing some discomfort in her right hip and both knees.  She gives history of nocturnal pain in the right hip.  She has some difficulty getting out of the chair and some difficulty walking.    Activities of Daily Living:  Patient reports morning stiffness for 0 minute.   Patient Reports nocturnal pain.  Difficulty dressing/grooming: Denies Difficulty climbing stairs: Denies Difficulty getting out of chair: Denies Difficulty using hands for taps, buttons, cutlery, and/or writing: Denies  Review of Systems  Constitutional:  Negative for fatigue.  HENT:  Negative for mouth sores and mouth dryness.   Eyes:  Negative for dryness.  Respiratory:  Negative for shortness of breath.   Cardiovascular:  Negative for chest pain and palpitations.  Gastrointestinal:  Negative for blood in stool, constipation and diarrhea.  Endocrine: Negative for increased urination.  Genitourinary:  Negative for involuntary urination.  Musculoskeletal:  Positive for joint pain and joint pain. Negative for gait problem, joint swelling, myalgias, muscle weakness, morning stiffness, muscle tenderness and myalgias.  Skin:  Negative for color change, rash, hair loss and sensitivity to  sunlight.  Allergic/Immunologic: Negative for susceptible to infections.  Neurological:  Negative for dizziness and headaches.  Hematological:  Negative for swollen glands.  Psychiatric/Behavioral:  Negative for depressed mood and sleep disturbance. The patient is not nervous/anxious.     PMFS History:  Patient Active Problem List   Diagnosis Date Noted   Postpartum care following cesarean delivery 06/11/2022   Preterm labor in third trimester 04/26/2022   Cervical incompetence affecting management of pregnancy in second trimester, antepartum 12/28/2021   Chronic hypertension 09/25/2021   HTN in pregnancy, chronic 05/09/2021   Mixed hyperlipidemia 05/09/2021   Primary osteoarthritis of both knees 04/06/2018   Iron deficiency anemia due to chronic blood loss 06/24/2017   Ulcerative colitis (HCC) 03/04/2016   Trochanteric bursitis of both hips 03/04/2016   ANA positive 03/04/2016   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   High risk medication use 03/04/2016   Autoimmune disease (HCC) 03/04/2016   History of hypertension 03/04/2016   History of hypothyroidism 03/04/2016   Urticaria 06/06/2015    Past Medical History:  Diagnosis Date   Family history of adverse reaction to anesthesia    mom hard to wake up   Herpes    Hypertension    PSC (primary sclerosing cholangitis)    Ulcerative colitis (HCC)    Vaginal Pap smear, abnormal     Family History  Problem Relation Age of Onset   Hypertension Mother    Healthy Mother    Cancer Mother        breast  Hypertension Father    Healthy Father    Healthy Brother    Healthy Daughter    Allergic rhinitis Neg Hx    Angioedema Neg Hx    Asthma Neg Hx    Atopy Neg Hx    Eczema Neg Hx    Urticaria Neg Hx    Immunodeficiency Neg Hx    Past Surgical History:  Procedure Laterality Date   BREAST LUMPECTOMY Left 1998   CERCLAGE REMOVAL     CERVICAL CERCLAGE N/A 12/28/2021   Procedure: CERCLAGE CERVICAL;  Surgeon: Ivery Marking, MD;  Location: [facility];  Service: Gynecology;  Laterality: N/A;   CESAREAN SECTION  2009   CESAREAN SECTION WITH BILATERAL TUBAL LIGATION N/A 06/11/2022   Procedure: CESAREAN SECTION WITH BILATERAL TUBAL LIGATION;  Surgeon: Ivery Marking, MD;  Location: [facility];  Service: Obstetrics;  Laterality: N/A;   LEEP     Social History   Social History Narrative   Not on file   Immunization History  Administered Date(s) Administered   PFIZER(Purple Top)SARS-COV-2 Vaccination 08/06/2019, 08/27/2019     Objective: Vital Signs: BP (!) 142/92 (BP Location: Left Arm, Patient Position: Sitting, Cuff Size: Normal)   Pulse (!) 55   Resp 14   Ht 5\' 7"  (1.702 m)   Wt 185 lb (83.9 kg)   LMP 06/21/2023   Breastfeeding Yes   BMI 28.98 kg/m    Physical Exam Vitals and nursing note reviewed.  Constitutional:      Appearance: She is well-developed.  HENT:     Head: Normocephalic and atraumatic.  Eyes:     Conjunctiva/sclera: Conjunctivae normal.  Cardiovascular:     Rate and Rhythm: Normal rate and regular rhythm.     Heart sounds: Normal heart sounds.  Pulmonary:     Effort: Pulmonary effort is normal.     Breath sounds: Normal breath sounds.  Abdominal:     General: Bowel sounds are normal.     Palpations: Abdomen is soft.  Musculoskeletal:     Cervical back: Normal range of motion.  Lymphadenopathy:     Cervical: No cervical adenopathy.  Skin:    General: Skin is warm and dry.     Capillary Refill: Capillary refill takes less than 2 seconds.  Neurological:     Mental Status: She is alert and oriented to person, place, and time.  Psychiatric:        Behavior: Behavior normal.      Musculoskeletal Exam: Cervical, thoracic and lumbar spine with good range of motion.  Shoulder joints, elbow joints, wrist joints, MCPs PIPs and DIPs with good range of motion with no synovitis.  Hip joints and knee joints with good range of motion.  She had tenderness on palpation  of the right trochanteric bursa.  There was no tenderness over ankles or MTPs.  CDAI Exam: CDAI Score: -- Patient Global: --; Provider Global: -- Swollen: --; Tender: -- Joint Exam 07/02/2023   No joint exam has been documented for this visit   There is currently no information documented on the homunculus. Go to the Rheumatology activity and complete the homunculus joint exam.  Investigation: No additional findings.  Imaging: MR ABDOMEN MRCP W WO CONTAST Result Date: 07/01/2023 CLINICAL DATA:  Primary sclerosing cholangitis, elevated alkaline phosphatase EXAM: MRI ABDOMEN WITHOUT AND WITH CONTRAST (INCLUDING MRCP) TECHNIQUE: Multiplanar multisequence MR imaging of the abdomen was performed both before and after the administration of intravenous contrast. Heavily T2-weighted images of the biliary and pancreatic  ducts were obtained, and three-dimensional MRCP images were rendered by post processing. CONTRAST:  8mL GADAVIST  GADOBUTROL  1 MMOL/ML IV SOLN COMPARISON:  08/29/2022 FINDINGS: Lower chest: No acute abnormality. Hepatobiliary: No solid liver abnormality is seen. No gallstones or gallbladder wall thickening. Unchanged, beaded appearance of the intra and extrahepatic bile ducts, without dilatation. Unchanged, mild, diffuse periportal edema and periportal contrast enhancement. Pancreas: Unremarkable. No pancreatic ductal dilatation or surrounding inflammatory changes. Spleen: Normal in size without significant abnormality. Adrenals/Urinary Tract: Adrenal glands are unremarkable. Kidneys are normal, without renal calculi, solid lesion, or hydronephrosis. Stomach/Bowel: Stomach is within normal limits. No evidence of bowel wall thickening, distention, or inflammatory changes. Moderate burden of stool throughout the colon. Vascular/Lymphatic: No significant vascular findings are present. No enlarged abdominal lymph nodes. Other: No abdominal wall hernia or abnormality. No ascites. Musculoskeletal:  No acute or significant osseous findings. IMPRESSION: 1. Unchanged, beaded appearance of the intra and extrahepatic bile ducts, without dilatation. 2. Unchanged, mild, diffuse periportal edema and periportal contrast enhancement. 3. 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 4. No focal liver lesion. Electronically Signed   By: Fredricka Jenny M.D.   On: 07/01/2023 21:19   MR 3D Recon At Scanner Result Date: 07/01/2023 CLINICAL DATA:  Primary sclerosing cholangitis, elevated alkaline phosphatase EXAM: MRI ABDOMEN WITHOUT AND WITH CONTRAST (INCLUDING MRCP) TECHNIQUE: Multiplanar multisequence MR imaging of the abdomen was performed both before and after the administration of intravenous contrast. Heavily T2-weighted images of the biliary and pancreatic ducts were obtained, and three-dimensional MRCP images were rendered by post processing. CONTRAST:  8mL GADAVIST  GADOBUTROL  1 MMOL/ML IV SOLN COMPARISON:  08/29/2022 FINDINGS: Lower chest: No acute abnormality. Hepatobiliary: No solid liver abnormality is seen. No gallstones or gallbladder wall thickening. Unchanged, beaded appearance of the intra and extrahepatic bile ducts, without dilatation. Unchanged, mild, diffuse periportal edema and periportal contrast enhancement. Pancreas: Unremarkable. No pancreatic ductal dilatation or surrounding inflammatory changes. Spleen: Normal in size without significant abnormality. Adrenals/Urinary Tract: Adrenal glands are unremarkable. Kidneys are normal, without renal calculi, solid lesion, or hydronephrosis. Stomach/Bowel: Stomach is within normal limits. No evidence of bowel wall thickening, distention, or inflammatory changes. Moderate burden of stool throughout the colon. Vascular/Lymphatic: No significant vascular findings are present. No enlarged abdominal lymph nodes. Other: No abdominal wall hernia or abnormality. No ascites. Musculoskeletal: No acute or  significant osseous findings. IMPRESSION: 1. Unchanged, beaded appearance of the intra and extrahepatic bile ducts, without dilatation. 2. Unchanged, mild, diffuse periportal edema and periportal contrast enhancement. 3. 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 4. No focal liver lesion. Electronically Signed   By: Fredricka Jenny M.D.   On: 07/01/2023 21:19    Recent Labs: Lab Results  Component Value Date   WBC 13.2 (H) 06/12/2022   HGB 11.2 (L) 06/12/2022   PLT 262 06/12/2022   NA 136 04/26/2022   K 3.5 04/26/2022   CL 104 04/26/2022   CO2 22 04/26/2022   GLUCOSE 73 04/26/2022   BUN 5 (L) 04/26/2022   CREATININE 0.67 04/26/2022   BILITOT 0.9 04/26/2022   ALKPHOS 279 (H) 04/26/2022   AST 23 04/26/2022   ALT 22 04/26/2022   PROT 7.1 04/26/2022   ALBUMIN 2.7 (L) 04/26/2022   CALCIUM  9.2 04/26/2022   GFRAA 86 10/16/2017   QFTBGOLDPLUS NEGATIVE 10/16/2017    Speciality Comments: No specialty comments available.  Procedures:  No procedures performed Allergies: Patient has no known allergies.   Assessment /  Plan:     Visit Diagnoses: Positive ANA (antinuclear antibody) - Positive ANA, positive SSA antibody, RF-.  Patient denies any history of dry mouth dryness.  She denies any history of shortness of breath or palpitations.  There is no history of lymphadenopathy.  Lungs were clear to auscultation.  No synovitis was noted on the examination. -Will obtain labs today.  Plan: Protein / creatinine ratio, urine, ANA, Anti-DNA antibody, double-stranded, C3 and C4, Sedimentation rate, Sjogrens syndrome-A extractable nuclear antibody, Serum protein electrophoresis with reflex  Trochanteric bursitis, right hip-she has been having pain and discomfort over right trochanteric bursa.  Recent concern regarding trochanteric bursitis was provided and a handout was placed in the AVS.  A handout on IT band stretches was given.  I offered cortisone  injection but she would like to wait.  She declined physical therapy.  She will contact us  if she wants a cortisone injection.  Chronic pain of both knees-she has intermittent discomfort in her knee joints.  A handout on knee joint exercises was given.  History of ulcerative colitis - Treated by Dr. Tova Fresh with Xeljanz  in the past.  She has been off Xeljanz  after she became pregnant.  She is on Lialda .  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  History of hypertension-blood pressure was elevated at 142/92.  She was advised to monitor blood pressure closely and follow-up with the PCP.  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  Miscarriage 2002 twins at 20 weeks  Cervical incompetence affecting management of pregnancy in second trimester, antepartum  Iron deficiency anemia due to chronic blood loss  History of hypothyroidism  Orders: Orders Placed This Encounter  Procedures   Protein / creatinine ratio, urine   ANA   Anti-DNA antibody, double-stranded   C3 and C4   Sedimentation rate   Sjogrens syndrome-A extractable nuclear antibody   Serum protein electrophoresis with reflex   No orders of the defined types were placed in this encounter.    Follow-Up Instructions: Return in about 1 year (around 07/01/2024) for +ANA.   Nicholas Bari, MD  Note - This record has been created using Animal nutritionist.  Chart creation errors have been sought, but may not always  have been located. Such creation errors do not reflect on  the standard of medical care.

## 2023-06-30 ENCOUNTER — Ambulatory Visit (HOSPITAL_COMMUNITY)
Admission: RE | Admit: 2023-06-30 | Discharge: 2023-06-30 | Disposition: A | Discharge status: Home / Self Care | Source: Ambulatory Visit | Attending: Internal Medicine | Admitting: Internal Medicine

## 2023-06-30 ENCOUNTER — Other Ambulatory Visit (HOSPITAL_COMMUNITY): Payer: Self-pay | Admitting: Internal Medicine

## 2023-06-30 DIAGNOSIS — R748 Abnormal levels of other serum enzymes: Secondary | ICD-10-CM | POA: Insufficient documentation

## 2023-06-30 DIAGNOSIS — K8301 Primary sclerosing cholangitis: Secondary | ICD-10-CM | POA: Diagnosis not present

## 2023-06-30 DIAGNOSIS — R609 Edema, unspecified: Secondary | ICD-10-CM | POA: Diagnosis not present

## 2023-06-30 DIAGNOSIS — R7989 Other specified abnormal findings of blood chemistry: Secondary | ICD-10-CM | POA: Diagnosis not present

## 2023-06-30 MED ORDER — GADOBUTROL 1 MMOL/ML IV SOLN
8.0000 mL | Freq: Once | INTRAVENOUS | Status: AC | PRN
Start: 2023-06-30 — End: 2023-06-30
  Administered 2023-06-30: 8 mL via INTRAVENOUS

## 2023-07-02 ENCOUNTER — Ambulatory Visit: Payer: Commercial Managed Care - PPO | Attending: Rheumatology | Admitting: Rheumatology

## 2023-07-02 ENCOUNTER — Encounter: Payer: Self-pay | Admitting: Rheumatology

## 2023-07-02 VITALS — BP 135/87 | HR 55 | Resp 14 | Ht 67.0 in | Wt 185.0 lb

## 2023-07-02 DIAGNOSIS — M25562 Pain in left knee: Secondary | ICD-10-CM | POA: Diagnosis not present

## 2023-07-02 DIAGNOSIS — R768 Other specified abnormal immunological findings in serum: Secondary | ICD-10-CM | POA: Diagnosis not present

## 2023-07-02 DIAGNOSIS — G8929 Other chronic pain: Secondary | ICD-10-CM | POA: Diagnosis not present

## 2023-07-02 DIAGNOSIS — K8301 Primary sclerosing cholangitis: Secondary | ICD-10-CM | POA: Diagnosis not present

## 2023-07-02 DIAGNOSIS — Z8719 Personal history of other diseases of the digestive system: Secondary | ICD-10-CM

## 2023-07-02 DIAGNOSIS — Z8639 Personal history of other endocrine, nutritional and metabolic disease: Secondary | ICD-10-CM

## 2023-07-02 DIAGNOSIS — M25561 Pain in right knee: Secondary | ICD-10-CM | POA: Diagnosis not present

## 2023-07-02 DIAGNOSIS — D500 Iron deficiency anemia secondary to blood loss (chronic): Secondary | ICD-10-CM

## 2023-07-02 DIAGNOSIS — Z8679 Personal history of other diseases of the circulatory system: Secondary | ICD-10-CM

## 2023-07-02 DIAGNOSIS — N96 Recurrent pregnancy loss: Secondary | ICD-10-CM

## 2023-07-02 DIAGNOSIS — R7689 Other specified abnormal immunological findings in serum: Secondary | ICD-10-CM

## 2023-07-02 DIAGNOSIS — M7061 Trochanteric bursitis, right hip: Secondary | ICD-10-CM

## 2023-07-02 DIAGNOSIS — E782 Mixed hyperlipidemia: Secondary | ICD-10-CM

## 2023-07-02 DIAGNOSIS — O3432 Maternal care for cervical incompetence, second trimester: Secondary | ICD-10-CM

## 2023-07-02 NOTE — Patient Instructions (Addendum)
 Hip Bursitis  Hip bursitis is swelling of one or more fluid-filled sacs (bursae) in your hip joint. If the bursa becomes irritated, it can fill with extra fluid and become swollen. This condition can cause pain, and your symptoms may come and go over time. What are the causes? Repeated use of your hip muscles. Injury to the hip. Weak butt muscles. Bone spurs. Infection. In some cases, the cause may not be known. What increases the risk? Having a past hip injury or hip surgery. Having a condition, such as arthritis, gout, diabetes, or thyroid  disease. Having spine problems. Having one leg that is shorter than the other. Running a lot or doing long-distance running. Playing sports where there is a risk of injury or falling, such as football, martial arts, or skiing. What are the signs or symptoms? Symptoms may come and go, and they often include: Pain in the hip or groin area. Pain may get worse when you move your hip. Tenderness and swelling of the hip. In rare cases, the bursa may become infected. If this happens, you may: Get a fever. Have warmth and redness in the hip area. How is this treated? This condition is treated by: Resting your hip. Icing your hip. Wrapping the hip area with an elastic bandage (compression wrap). Keeping the hip raised. Other treatments may include: Using crutches, a cane, or a walker. Medicines. Draining fluid out of the bursa. Surgery to take out a bursa. This is rare. Long-term treatment may include: Doing exercises to help your strength and flexibility. Lifestyle changes like losing weight to lessen the strain on your hip. Follow these instructions at home: Managing pain, stiffness, and swelling     If told, put ice on the painful area. To do this: Put ice in a plastic bag. Place a towel between your skin and the bag. Leave the ice on for 20 minutes, 2-3 times a day. Take off the ice if your skin turns bright red. This is very important.  If you cannot feel pain, heat, or cold, you have a greater risk of damage to the area. Raise your hip by putting a pillow under your hips while you lie down. Stop if you feel pain. If told, put heat on the affected area. Do this as often as told by your doctor. Use the heat source that your doctor recommends, such as a moist heat pack or a heating pad. Place a towel between your skin and the heat source. Leave the heat on for 20-30 minutes. Take off the heat if your skin turns bright red. This is very important. If you cannot feel pain, heat, or cold, you have a greater risk of getting burned. Activity Do not use your hip to support your body weight until your doctor says that you can. Use crutches, a cane, or a walker as told by your doctor. If the affected leg is one that you use to drive, ask your doctor if it is safe to drive. Rest and protect your hip as much as you can until you feel better. Return to your normal activities when your doctor says that it is safe. Do exercises as told by your doctor. General instructions Take over-the-counter and prescription medicines only as told by your doctor. Gently rub and stretch your injured area as often as is comfortable. Wear elastic bandages only as told by your doctor. If one of your legs is shorter than the other, get fitted for a shoe insert or orthotic. Keep a healthy  weight. Follow instructions from your doctor. Keep all follow-up visits. How is this prevented? Exercise regularly or as told by your doctor. Wear the right shoes for the sport you play and for daily activities. Warm up and stretch before being active. Cool down and stretch after being active. Take breaks often from repeated activity. Avoid activities that bother your hip or cause pain. Avoid sitting down for a long time. Where to find more information [employer]: orthoinfo.aaos.org Contact a doctor if: You have a fever. You have new  symptoms. You have trouble walking or doing everyday activities. You have pain that gets worse or does not get better with medicine. The skin around your hip is red. You get a feeling of warmth in your hip area. Get help right away if: You cannot move your hip. You have very bad pain. You cannot control the muscles in your feet. Summary Hip bursitis is swelling of one or more fluid-filled sacs (bursae) in your hip joint. Symptoms often come and go over time. This condition is often treated by resting and icing the hip. It also may help to keep the area raised and wrapped in an elastic bandage. Other treatments may be needed. This information is not intended to replace advice given to you by your health care provider. Make sure you discuss any questions you have with your health care provider. Document Revised: 01/29/2021 Document Reviewed: 01/29/2021 Elsevier Patient Education  2024 [employer]  Iliotibial Band Syndrome Rehab Ask your health care provider which exercises are safe for you. Do exercises exactly as told by your provider and adjust them as told. It's normal to feel mild stretching, pulling, tightness, or discomfort as you do these exercises. Stop right away if you feel sudden pain or your pain gets a lot worse. Do not begin these exercises until told by your provider. Stretching and range-of-motion exercises These exercises warm up your muscles and joints. They also improve the movement and flexibility of your hip and pelvis. Quadriceps stretch, prone  Lie face down (prone) on a firm surface like a bed or padded floor. Bend your left / right knee. Reach back to hold your ankle or pant leg. If you can't reach your ankle or pant leg, use a belt looped around your foot and grab the belt instead. Gently pull your heel toward your butt. Your knee should not slide out to the side. You should feel a stretch in the front of your thigh and knee, also called the quadriceps. Hold this  position for __________ seconds. Repeat __________ times. Complete this exercise __________ times a day. Iliotibial band stretch The iliotibial band is a strip of tissue that runs along the outside of your hip down to your knee. Lie on your side with your left / right leg on top. Bend both knees and grab your left / right ankle. Stretch out your bottom arm to help you balance. Slowly bring your top knee back so your thigh goes behind your back. Slowly lower your top leg toward the floor until you feel a gentle stretch on the outside of your left / right hip and thigh. If you don't feel a stretch and your knee won't go farther, place the heel of your other foot on top of your knee and pull your knee down toward the floor with your foot. Hold this position for __________ seconds. Repeat __________ times. Complete this exercise __________ times a day. Strengthening exercises These exercises build strength and endurance  in your hip and pelvis. Endurance means your muscles can keep working even when they're tired. Straight leg raises, side-lying This exercise strengthens the muscles that rotate the leg at the hip and move it away from your body. These muscles are called hip abductors. Lie on your side with your left / right leg on top. Lie so your head, shoulder, hip, and knee line up. You can bend your bottom knee to help you balance. Roll your hips slightly forward so they're stacked directly over each other. Your left / right knee should face forward. Tense the muscles in your outer thigh and hip. Lift your top leg 4-6 inches (10-15 cm) off the ground. Hold this position for __________ seconds. Slowly lower your leg back down to the starting position. Let your muscles fully relax before doing this exercise again. Repeat __________ times. Complete this exercise __________ times a day. Leg raises, prone This exercise strengthens the muscles that move the hips backward. These muscles are called hip  extensors. Lie face down (prone) on your bed or a firm surface. You can put a pillow under your hips for comfort and to support your lower back. Bend your left / right knee so your foot points straight up toward the ceiling. Keep the other leg straight and behind you. Squeeze your butt muscles. Lift your left / right thigh off the firm surface. Do not let your back arch. Tense your thigh muscle as hard as you can without having more knee pain. Hold this position for __________ seconds. Slowly lower your leg to the starting position. Allow your leg to relax all the way. Repeat __________ times. Complete this exercise __________ times a day. Hip hike  Stand sideways on a bottom step. Place your feet so that your left / right leg is on the step, and the other foot is hanging off the side. If you need support for balance, hold onto a railing or wall. Keep your knees straight and your abdomen square, meaning your hips are level. Then, lift your left / right hip up toward the ceiling. Slowly let your leg that's hanging off the step lower towards the floor. Your foot should get closer to the ground. Do not lean or bend your knees during this movement. Repeat __________ times. Complete this exercise __________ times a day. This information is not intended to replace advice given to you by your health care provider. Make sure you discuss any questions you have with your health care provider. Document Revised: 04/18/2022 Document Reviewed: 04/18/2022 Elsevier Patient Education  2024 [employer]  Exercises for Chronic Knee Pain Chronic knee pain is pain that lasts longer than 3 months. For most people with chronic knee pain, exercise and weight loss is an important part of treatment. Your health care provider may want you to focus on: Making the muscles that support your knee stronger. This can take pressure off your knee and reduce pain. Preventing knee stiffness. How far you can move your knee,  keeping it there or making it farther. Losing weight (if this applies) to take pressure off your knee, lower your risk for injury, and make it easier for you to exercise. Your provider will help you make an exercise program that fits your needs and physical abilities. Below are simple, low-impact exercises you can do at home. Ask your provider or physical therapist how often you should do your exercise program and how many times to repeat each exercise. General safety tips  Get your provider's approval  before doing any exercises. Start slowly and stop any time you feel pain. Do not exercise if your knee pain is flaring up. Warm up first. Stretching a cold muscle can cause an injury. Do 5-10 minutes of easy movement or light stretching before beginning your exercises. Do 5-10 minutes of low-impact activity (like walking or cycling) before starting strengthening exercises. Contact your provider any time you have pain during or after exercising. Exercise can cause discomfort but should not be painful. It is normal to be a little stiff or sore after exercising. Stretching and range-of-motion exercises Front thigh stretch  Stand up straight and support your body by holding on to a chair or resting one hand on a wall. With your legs straight and close together, bend one knee to lift your heel up toward your butt. Using one hand for support, grab your ankle with your free hand. Pull your foot up closer toward your butt to feel the stretch in front of your thigh. Hold the stretch for 30 seconds. Repeat __________ times. Complete this exercise __________ times a day. Back thigh stretch  Sit on the floor with your back straight and your legs out straight in front of you. Place the palms of your hands on the floor and slide them toward your feet as you bend at the hip. Try to touch your nose to your knees and feel the stretch in the back of your thighs. Hold for 30 seconds. Repeat __________ times.  Complete this exercise __________ times a day. Calf stretch  Stand facing a wall. Place the palms of your hands flat against the wall, arms extended, and lean slightly against the wall. Get into a lunge position with one leg bent at the knee and the other leg stretched out straight behind you. Keep both feet facing the wall and increase the bend in your knee while keeping the heel of the other leg flat on the ground. You should feel the stretch in your calf. Hold for 30 seconds. Repeat __________ times. Complete this exercise __________ times a day. Strengthening exercises Straight leg lift  Lie on your back with one knee bent and the other leg out straight. Slowly lift the straight leg without bending the knee. Lift until your foot is about 12 inches (30 cm) off the floor. Hold for 3-5 seconds and slowly lower your leg. Repeat __________ times. Complete this exercise __________ times a day. Single leg dip  Stand between two chairs and put both hands on the backs of the chairs for support. Extend one leg out straight with your body weight resting on the heel of the standing leg. Slowly bend your standing knee to dip your body to the level that is comfortable for you. Hold for 3-5 seconds. Repeat __________ times. Complete this exercise __________ times a day. Hamstring curls  Stand straight, knees close together, facing the back of a chair. Hold on to the back of a chair with both hands. Keep one leg straight. Bend the other knee while bringing the heel up toward the butt until the knee is bent at a 90-degree angle (right angle). Hold for 3-5 seconds. Repeat __________ times. Complete this exercise __________ times a day. Wall squat  Stand straight with your back, hips, and head against a wall. Step forward one foot at a time with your back still against the wall. Your feet should be 2 feet (61 cm) from the wall at shoulder width. Keeping your back, hips, and head against the wall,  slide down the wall to as close to a sitting position as you can get. Hold for 5-10 seconds, then slowly slide back up. Repeat __________ times. Complete this exercise __________ times a day. Step-ups  Stand in front of a sturdy platform or stool that is about 6 inches (15 cm) high. Slowly step up with your left / right foot, keeping your knee in line with your hip and foot. Do not let your knee bend so far that you cannot see your toes. Hold on to a chair for balance, but do not use it for support. Slowly unlock your knee and lower yourself to the starting position. Repeat __________ times. Complete this exercise __________ times a day. Contact a health care provider if: Your exercises cause pain. Your pain is worse after you exercise. Your pain prevents you from doing your exercises. This information is not intended to replace advice given to you by your health care provider. Make sure you discuss any questions you have with your health care provider. Document Revised: 02/18/2022 Document Reviewed: 02/18/2022 Elsevier Patient Education  2024 [employer].

## 2023-07-03 ENCOUNTER — Other Ambulatory Visit: Payer: Self-pay | Admitting: *Deleted

## 2023-07-03 ENCOUNTER — Ambulatory Visit: Payer: Self-pay | Admitting: Rheumatology

## 2023-07-03 DIAGNOSIS — Z8719 Personal history of other diseases of the digestive system: Secondary | ICD-10-CM

## 2023-07-03 DIAGNOSIS — D500 Iron deficiency anemia secondary to blood loss (chronic): Secondary | ICD-10-CM

## 2023-07-03 DIAGNOSIS — R809 Proteinuria, unspecified: Secondary | ICD-10-CM

## 2023-07-03 DIAGNOSIS — K8301 Primary sclerosing cholangitis: Secondary | ICD-10-CM

## 2023-07-03 DIAGNOSIS — E782 Mixed hyperlipidemia: Secondary | ICD-10-CM

## 2023-07-03 DIAGNOSIS — R768 Other specified abnormal immunological findings in serum: Secondary | ICD-10-CM

## 2023-07-03 NOTE — Telephone Encounter (Signed)
-----   Message from [facility] sent at 07/03/2023  8:30 AM EDT ----- Urine protein creatinine ratio is elevated.  Sed rate is mildly elevated.  Rest of the labs are pending.  Please have patient repeat urine protein creatinine ratio next week.  If it stays elevated we will refer her to nephrology for evaluation.

## 2023-07-03 NOTE — Progress Notes (Signed)
 Urine protein creatinine ratio is elevated.  Sed rate is mildly elevated.  Rest of the labs are pending.  Please have patient repeat urine protein creatinine ratio next week.  If it stays elevated we will refer her to nephrology for evaluation.

## 2023-07-04 LAB — PROTEIN ELECTROPHORESIS, SERUM, WITH REFLEX
Albumin ELP: 4 g/dL (ref 3.8–4.8)
Alpha 1: 0.3 g/dL (ref 0.2–0.3)
Alpha 2: 0.6 g/dL (ref 0.5–0.9)
Beta 2: 0.5 g/dL (ref 0.2–0.5)
Beta Globulin: 0.5 g/dL (ref 0.4–0.6)
Gamma Globulin: 1.6 g/dL (ref 0.8–1.7)
Total Protein: 7.4 g/dL (ref 6.1–8.1)

## 2023-07-04 LAB — PROTEIN / CREATININE RATIO, URINE
Creatinine, Urine: 155 mg/dL (ref 20–275)
Protein/Creat Ratio: 445 mg/g{creat} — ABNORMAL HIGH (ref 24–184)
Protein/Creatinine Ratio: 0.445 mg/mg{creat} — ABNORMAL HIGH (ref 0.024–0.184)
Total Protein, Urine: 69 mg/dL — ABNORMAL HIGH (ref 5–24)

## 2023-07-04 LAB — ANTI-NUCLEAR AB-TITER (ANA TITER)
ANA TITER: 1:320 {titer} — ABNORMAL HIGH
ANA TITER: 1:80 {titer} — ABNORMAL HIGH
ANA Titer 1: 1:40 {titer} — ABNORMAL HIGH

## 2023-07-04 LAB — ANA: Anti Nuclear Antibody (ANA): POSITIVE — AB

## 2023-07-04 LAB — ANTI-DNA ANTIBODY, DOUBLE-STRANDED: ds DNA Ab: 1 [IU]/mL

## 2023-07-04 LAB — SEDIMENTATION RATE: Sed Rate: 22 mm/h — ABNORMAL HIGH (ref 0–20)

## 2023-07-04 LAB — C3 AND C4
C3 Complement: 161 mg/dL (ref 83–193)
C4 Complement: 15 mg/dL (ref 15–57)

## 2023-07-04 LAB — SJOGRENS SYNDROME-A EXTRACTABLE NUCLEAR ANTIBODY: SSA (Ro) (ENA) Antibody, IgG: >8 AI — AB

## 2023-07-05 NOTE — Progress Notes (Signed)
 ANA and SSA antibodies remain positive.  Sed rate is mildly elevated and not significant.  Complements are normal.  Double-stranded DNA negative.

## 2023-07-15 ENCOUNTER — Other Ambulatory Visit: Payer: Self-pay | Admitting: *Deleted

## 2023-07-15 DIAGNOSIS — R809 Proteinuria, unspecified: Secondary | ICD-10-CM | POA: Diagnosis not present

## 2023-07-15 DIAGNOSIS — R768 Other specified abnormal immunological findings in serum: Secondary | ICD-10-CM | POA: Diagnosis not present

## 2023-07-15 DIAGNOSIS — D500 Iron deficiency anemia secondary to blood loss (chronic): Secondary | ICD-10-CM | POA: Diagnosis not present

## 2023-07-15 DIAGNOSIS — E782 Mixed hyperlipidemia: Secondary | ICD-10-CM | POA: Diagnosis not present

## 2023-07-15 DIAGNOSIS — Z8719 Personal history of other diseases of the digestive system: Secondary | ICD-10-CM | POA: Diagnosis not present

## 2023-07-15 DIAGNOSIS — K8301 Primary sclerosing cholangitis: Secondary | ICD-10-CM

## 2023-07-16 ENCOUNTER — Ambulatory Visit: Payer: Self-pay | Admitting: Rheumatology

## 2023-07-16 DIAGNOSIS — R809 Proteinuria, unspecified: Secondary | ICD-10-CM

## 2023-07-16 LAB — PROTEIN / CREATININE RATIO, URINE
Creatinine, Urine: 33 mg/dL (ref 20–275)
Protein/Creat Ratio: 485 mg/g{creat} — ABNORMAL HIGH (ref 24–184)
Protein/Creatinine Ratio: 0.485 mg/mg{creat} — ABNORMAL HIGH (ref 0.024–0.184)
Total Protein, Urine: 16 mg/dL (ref 5–24)

## 2023-07-16 NOTE — Progress Notes (Signed)
 Urine protein creatinine ratio remains elevated.  Please refer patient to nephrology for evaluation.

## 2023-07-16 NOTE — Telephone Encounter (Signed)
-----   Message from [facility] sent at 07/16/2023  9:22 AM EDT ----- Urine protein creatinine ratio remains elevated.  Please refer patient to nephrology for evaluation.

## 2023-07-17 ENCOUNTER — Other Ambulatory Visit (HOSPITAL_COMMUNITY): Payer: Self-pay

## 2023-07-22 ENCOUNTER — Other Ambulatory Visit (HOSPITAL_COMMUNITY): Payer: Self-pay

## 2023-07-22 ENCOUNTER — Other Ambulatory Visit (HOSPITAL_BASED_OUTPATIENT_CLINIC_OR_DEPARTMENT_OTHER): Payer: Self-pay

## 2023-07-22 ENCOUNTER — Other Ambulatory Visit: Payer: Self-pay

## 2023-07-22 MED ORDER — URSODIOL 300 MG PO CAPS
600.0000 mg | ORAL_CAPSULE | Freq: Two times a day (BID) | ORAL | 2 refills | Status: AC
  Filled 2023-07-22: qty 360, 90d supply, fill #0
  Filled 2023-10-16: qty 360, 90d supply, fill #1
  Filled 2024-01-17: qty 139, 35d supply, fill #2
  Filled 2024-01-19: qty 221, 55d supply, fill #2
  Filled 2024-02-25: qty 221, 55d supply, fill #3

## 2023-08-05 DIAGNOSIS — K8301 Primary sclerosing cholangitis: Secondary | ICD-10-CM | POA: Diagnosis not present

## 2023-08-05 DIAGNOSIS — Z01419 Encounter for gynecological examination (general) (routine) without abnormal findings: Secondary | ICD-10-CM | POA: Diagnosis not present

## 2023-08-05 DIAGNOSIS — I10 Essential (primary) hypertension: Secondary | ICD-10-CM | POA: Diagnosis not present

## 2023-08-05 DIAGNOSIS — K5190 Ulcerative colitis, unspecified, without complications: Secondary | ICD-10-CM | POA: Diagnosis not present

## 2023-08-10 DIAGNOSIS — K8301 Primary sclerosing cholangitis: Secondary | ICD-10-CM | POA: Diagnosis not present

## 2023-08-10 NOTE — Progress Notes (Signed)
 Hepatology Clinic Follow Up Patient Evaluation Note    SUBJECTIVE: History of Present Illness: Jamie Duarte is a 43 y.o. female who presents for follow up care regarding PSC.   She presents to clinic unaccompanied.  Ms. Vanecek denies abdominal pain, nausea, and vomiting.  She denies edema, ascites, jaundice, confusion, and GI bleeding.  She is passing one to two stools daily.  She reports that she continues with intermittent pruritus but that this is controlled with ursodiol  and cholestyramine .  Patient Active Problem List  Diagnosis  . SS-A antibody positive  . Hypertension  . Ulcerative colitis (CMS/HHS-HCC)  . Primary sclerosing cholangitis (CMS-HCC)    Past Medical History:  Diagnosis Date  . Hypertension   . Primary sclerosing cholangitis (CMS-HCC)   . SS-A antibody positive   . Ulcerative colitis (CMS/HHS-HCC)    No past surgical history on file. Family History  Problem Relation Name Age of Onset  . Systemic hypertension Mother    . Systemic hypertension Father    . Systemic hypertension Maternal Grandmother     Social History   Tobacco Use  . Smoking status: Never  . Smokeless tobacco: Never  Substance Use Topics  . Alcohol use: Not Currently     No Known Allergies   Medications:  Outpatient Medications Marked as Taking for the 08/10/23 encounter (Office Visit) with Nola Tobias Brunt, MD  Medication Sig Dispense Refill  . amLODIPine  (NORVASC ) 10 MG tablet Take 10 mg by mouth once daily    . cholestyramine  (QUESTRAN ) 4 gram oral powder packet Take 4 g by mouth 2 (two) times daily    . folic acid  (FOLVITE ) 1 MG tablet Take 1 mg by mouth once daily    . mesalamine  (LIALDA ) 1.2 gram EC tablet Take 2.4 g by mouth 2 (two) times daily    . metoprolol  succinate (TOPROL -XL) 100 MG XL tablet Take 100 mg by mouth once daily    . multivitamin capsule Take 1 capsule by mouth once daily    . omega-3 fatty acids/fish oil (FISH OIL) 340-1,000 mg capsule Take 1 capsule  by mouth once daily    . ursodioL  (ACTIGALL ) 300 mg capsule Take 600 mg by mouth 2 (two) times daily      Review of Systems:  Constitutional: negative Eyes: negative Ears, nose, mouth, throat, and face: negative Respiratory: negative Cardiovascular: negative Gastrointestinal: negative Genitourinary:negative Musculoskeletal:notes having right hip and bilateral knee pain - follows with Rheumatology Neurological: negative Hematologic/lymphatic: negative   OBJECTIVE:  Physical Exam: Vitals:   08/10/23 0957  BP: 124/75  Pulse: 68  Resp:   Temp:    Ht:168.9 cm (5' 6.5) Wt:84.6 kg (186 lb 8.2 oz) ADJ:Anib surface area is 1.99 meters squared. Body mass index is 29.66 kg/m.  General appearance: alert and cooperative Eyes:  anicteric sclera Neck: supple, symmetrical, trachea midline Back: symmetric, nontender Lungs: clear to auscultation bilaterally Heart: regular rate and rhythm, S1, S2 normal, no murmur Abdomen: soft, non-tender; bowel sounds normal; no masses, no clinical evidence of ascites Extremities: no edema Neurologic: no tremor or asterixis   LABORATORY DATA: Office Visit on 08/10/2023  Component Date Value Ref Range Status  . Sodium 08/10/2023 135  135 - 145 mmol/L Final  . Potassium 08/10/2023 3.8  3.5 - 5.0 mmol/L Final  . Chloride 08/10/2023 105  98 - 108 mmol/L Final  . Carbon Dioxide (CO2) 08/10/2023 23  21 - 30 mmol/L Final  . Urea Nitrogen (BUN) 08/10/2023 8  7 - 20 mg/dL Final  .  Creatinine 08/10/2023 0.8  0.4 - 1.0 mg/dL Final  . Glucose 93/76/7974 76  70 - 140 mg/dL Final   Interpretive Data: Above is the NONFASTING reference range.   Below are the FASTING reference ranges:  NORMAL:      70-99 mg/dL  PREDIABETES: 899-874 mg/dL  DIABETES:    > 874 mg/dL   . Calcium  08/10/2023 8.9  8.7 - 10.2 mg/dL Final  . AST (Aspartate Aminotransferase) 08/10/2023 45 (H)  15 - 41 U/L Final  . ALT (Alanine Aminotransferase) 08/10/2023 60 (H)  10 - 39 U/L Final   . Bilirubin, Total 08/10/2023 0.8  0.4 - 1.5 mg/dL Final  . Alk Phos (Alkaline Phosphatase) 08/10/2023 446 (H)  24 - 110 U/L Final  . Albumin 08/10/2023 3.5  3.5 - 4.8 g/dL Final  . Protein, Total 08/10/2023 7.9  6.2 - 8.1 g/dL Final  . Anion Gap 93/76/7974 7  3 - 12 mmol/L Final  . BUN/CREA Ratio 08/10/2023 10  6 - 27 Final  . Glomerular Filtration Rate (eGFR)  08/10/2023 93  mL/min/1.73sq m Final   CKD-EPI (2021) does not include patient's race in the calculation of eGFR. Monitoring changes of plasma creatinine and eGFR over time is useful for monitoring kidney function.  This change was made on 04/17/2020.  Interpretive Ranges for eGFR(CKD-EPI 2021):   eGFR:              > 60 mL/min/1.73 sq m - Normal  eGFR:              30 - 59 mL/min/1.73 sq m - Moderately Decreased  eGFR:              15 - 29 mL/min/1.73 sq m - Severely Decreased  eGFR:              < 15 mL/min/1.73 sq m -  Kidney Failure   Note: These eGFR calculations do not apply in acute situations  when eGFR is changing rapidly or in patients on dialysis.   . Prothrombin Time 08/10/2023 10.3  9.5 - 13.1 sec Final  . Prothrombin INR 08/10/2023 0.9  0.9 - 1.1 Final   Reference Ranges: DVT/PE/PVD = INR 2.0 - 3.0 Mechanical Heart Valve = INR 2.5 - 3.5  NOTE: The INR is not a PT Ratio and is valid only for Warfarin patients   . WBC (White Blood Cell Count) 08/10/2023 4.3  3.2 - 9.8 x10^9/L Final  . Hemoglobin 08/10/2023 13.5  11.7 - 15.5 g/dL Final  . Hematocrit 93/76/7974 40.3  35.0 - 45.0 % Final  . Platelets 08/10/2023 309  150 - 450 x10^9/L Final  . MCV (Mean Corpuscular Volume) 08/10/2023 92  80 - 98 fL Final  . MCH (Mean Corpuscular Hemoglobin) 08/10/2023 31.0  26.5 - 34.0 pg Final  . MCHC (Mean Corpuscular Hemoglobin * 08/10/2023 33.5  31.0 - 36.0 % Final  . RBC (Red Blood Cell Count) 08/10/2023 4.36  3.77 - 5.16 x10^12/L Final  . RDW-CV (Red Cell Distribution Widt* 08/10/2023 11.8  11.5 - 14.5 % Final  . NRBC  (Nucleated Red Blood Cell Cou* 08/10/2023 0.00  0 x10^9/L Final  . NRBC % (Nucleated Red Blood Cell %) 08/10/2023 0.0  % Final  . MPV (Mean Platelet Volume) 08/10/2023 9.8  7.2 - 11.7 fL Final  . Immature Platelet Fraction 08/10/2023 3.9  1.6 - 8.5 % Final  . Cancer Antigen 19-9 (CA 19-9) 08/10/2023 7  <=35 U/mL Final    Imaging:  MRI abdomen with and without contrast 06/30/2023: FINDINGS:  Lower chest: No acute abnormality.   Hepatobiliary: No solid liver abnormality is seen. No gallstones or  gallbladder wall thickening. Unchanged, beaded appearance of the  intra and extrahepatic bile ducts, without dilatation. Unchanged,  mild, diffuse periportal edema and periportal contrast enhancement.   Pancreas: Unremarkable. No pancreatic ductal dilatation or  surrounding inflammatory changes.   Spleen: Normal in size without significant abnormality.   Adrenals/Urinary Tract: Adrenal glands are unremarkable. Kidneys are  normal, without renal calculi, solid lesion, or hydronephrosis.   Stomach/Bowel: Stomach is within normal limits. No evidence of bowel  wall thickening, distention, or inflammatory changes. Moderate  burden of stool throughout the colon.   Vascular/Lymphatic: No significant vascular findings are present. No  enlarged abdominal lymph nodes.   Other: No abdominal wall hernia or abnormality. No ascites.   Musculoskeletal: No acute or significant osseous findings.   IMPRESSION:  1. Unchanged, beaded appearance of the intra and extrahepatic bile  ducts, without dilatation.  2. Unchanged, mild, diffuse periportal edema and periportal contrast  enhancement.  3. Findings are consistent with primary sclerosing cholangitis, with  mild ongoing active cholangitis.  4. No focal liver lesion.   MRI abdomen with and without contrast 09/04/2021: IMPRESSION:  1. Similar appearance of the biliary tree, with a beaded appearance  throughout, however without evidence of biliary  ductal dilatation.  2. Mild, persistent periportal contrast enhancement.  3. Findings are generally in keeping with reported diagnosis of  primary sclerosing cholangitis, with contrast enhancement suggesting  mild, persistent ongoing cholangitis.   MRI abdomen MRCP with and without contrast 11/15/2020: IMPRESSION:  1. No significant interval change from comparison exam 1 year prior.  2. Persistent mild stricturing/beading of the intrahepatic bile  ducts. Enhancement and edema along the portal triads is similar to  prior. No discrete enhancing lesion. Extrahepatic bile ducts are  poorly imaged due to small caliber.  3. Stable enlarged pancreas.   MRI abdomen MRCP with and without contrast 12/16/2019: IMPRESSION:  1. There is redemonstrated mild intrahepatic biliary ductal  dilatation, with some associated contrast enhancement of the portal  triads, which is however improved when compared to prior examination  dated 12/27/2018. Findings suggest active cholangitis improved  compared to prior examination.   2. There appears to be irregularity of the biliary tree, certain  segments incompletely visualized on MRCP. Common bile duct is normal  in caliber. There is no overt evidence of biliary stricturing. In  general functional degree of stricturing is better assessed by ERCP.   3. No MR abnormality of the bowel on this examination which does not  completely imaged the colon and is not tailored to evaluate for  inflammatory bowel disease.    Endoscopic Findings: She reports multiple recent colonoscopies completed by her local gastroenterologist most recently in 2022.  Not available in Care Everywhere.  Pathology: Liver biopsy pathology 04/02/2020: FINAL MICROSCOPIC DIAGNOSIS:   A. LIVER, BIOPSY:  - Liver parenchyma with minimal changes (see comment)  - No pathologic fibrosis    Liver biopsy pathology 11/27/2003:  FINAL DIAGNOSIS:              A. LIVER, NEEDLE CORE BIOPSIES:                    PORTAL EXPANSION WITH PERIDUCTAL CONCENTRIC FIBROSIS              AND  PERIPORTAL FIBROUS EXTENSIONS, COMPATIBLE WITH              PRIMARY SCLEROSING                     CHOLANGITIS, STAGE II.  SEE COMMENT.   Colonoscopy pathology 08/09/2003:               FINAL DIAGNOSIS:              A. TERMINAL ILEUM, BIOPSIES:                   BENIGN ILEAL MUCOSA WITHOUT DIAGNOSTIC ABNORMALITY.                   NEGATIVE FOR ILEITIS OR MALIGNANCY.               B. RIGHT COLON, BIOPSIES:                   DIFFUSE CHRONIC ACTIVE COLITIS, CHARACTERISTIC OF              CHRONIC INFLAMMATORY                     BOWEL DISEASE.                   NEGATIVE FOR DYSPLASIA OR MALIGNANCY.               C. TRANSVERSE COLON, BIOPSIES:                   DIFFUSE CHRONIC ACTIVE COLITIS, CHARACTERISTIC OF              CHRONIC INFLAMMATORY                      BOWEL DISEASE.                   NEGATIVE FOR DYSPLASIA OR MALIGNANCY.               D. LEFT COLON, BIOPSIES:                   DIFFUSE CHRONIC ACTIVE COLITIS, CHARACTERISTIC OF              CHRONIC INFLAMMATORY                     BOWEL DISEASE.                   NEGATIVE FOR DYSPLASIA OR MALIGNANCY.               E. RECTUM, BIOPSIES:                   DIFFUSE CHRONIC ACTIVE COLITIS, CHARACTERISTIC OF              CHRONIC INFLAMMATORY                     BOWEL DISEASE.                    NEGATIVE FOR DYSPLASIA OR MALIGNANCY.    ASSESSMENT / RECOMMENDATIONS:  Ms Pyper continues with PSC and lacks clinical and biochemical evidence of a decline in liver function.  Lab testing provide her with a MELD score of 9 today, which is too low for consideration of liver transplant evaluation.  She asked me in clinic today about the circumstances that would offer a  need to consider starting a liver transplant evaluation for her, and I discussed that there would need to be concern for development of decompensated liver function  (whether with cirrhosis or non-cirrhotic) or with concern for development of life-threatening complications of PSC such as bacteremia and/or liver abscess formation.  We also discussed the use of the MELD score in establishing priority for liver transplantation.  We discussed the results of her recent abdominal MRI (as above), which did not demonstrate concern for hepatobiliary cancer or dominant stricture.    She may continue ursodiol  at current dosing and may continue cholestyramine  as well.  I will see her again via telemedicine appointment in 6 months and will plan for repeat abdominal MRI on a yearly basis (so next MRI can be scheduled to occur in May 2026 - she prefers local MRI and thus will discuss scheduling of this when she is seen via telemedicine appointment 6 months from now.   Attestation Statement:   I personally performed the service. (TP)  CARLA ROSEALEE CLEAVES, MD

## 2023-08-18 ENCOUNTER — Other Ambulatory Visit (HOSPITAL_COMMUNITY): Payer: Self-pay

## 2023-08-18 ENCOUNTER — Other Ambulatory Visit: Payer: Self-pay

## 2023-08-19 ENCOUNTER — Other Ambulatory Visit: Payer: Self-pay

## 2023-08-29 ENCOUNTER — Other Ambulatory Visit (HOSPITAL_COMMUNITY): Payer: Self-pay

## 2023-09-09 DIAGNOSIS — R809 Proteinuria, unspecified: Secondary | ICD-10-CM | POA: Diagnosis not present

## 2023-09-09 DIAGNOSIS — N189 Chronic kidney disease, unspecified: Secondary | ICD-10-CM | POA: Diagnosis not present

## 2023-09-09 DIAGNOSIS — I129 Hypertensive chronic kidney disease with stage 1 through stage 4 chronic kidney disease, or unspecified chronic kidney disease: Secondary | ICD-10-CM | POA: Diagnosis not present

## 2023-09-11 ENCOUNTER — Other Ambulatory Visit (HOSPITAL_COMMUNITY): Payer: Self-pay

## 2023-09-11 ENCOUNTER — Other Ambulatory Visit: Payer: Self-pay

## 2023-09-11 MED ORDER — LOSARTAN POTASSIUM 25 MG PO TABS
25.0000 mg | ORAL_TABLET | Freq: Every day | ORAL | 3 refills | Status: AC
  Filled 2023-09-11: qty 90, 90d supply, fill #0
  Filled 2023-12-07: qty 90, 90d supply, fill #1
  Filled 2024-03-07: qty 90, 90d supply, fill #2

## 2023-09-11 NOTE — Progress Notes (Signed)
 Spoke with Dr. Sheena. Orders to discontinue Metoprolol  50 mg (half tablet) for 5 days then every other day for 5 days then stop.  Losartan 25 mg once daily

## 2023-09-12 ENCOUNTER — Other Ambulatory Visit (HOSPITAL_COMMUNITY): Payer: Self-pay

## 2023-10-12 DIAGNOSIS — Z0000 Encounter for general adult medical examination without abnormal findings: Secondary | ICD-10-CM | POA: Diagnosis not present

## 2023-10-12 DIAGNOSIS — Z131 Encounter for screening for diabetes mellitus: Secondary | ICD-10-CM | POA: Diagnosis not present

## 2023-10-12 DIAGNOSIS — I10 Essential (primary) hypertension: Secondary | ICD-10-CM | POA: Diagnosis not present

## 2023-10-12 DIAGNOSIS — E782 Mixed hyperlipidemia: Secondary | ICD-10-CM | POA: Diagnosis not present

## 2023-10-13 ENCOUNTER — Other Ambulatory Visit (HOSPITAL_COMMUNITY): Payer: Self-pay

## 2023-10-13 MED ORDER — FOLIC ACID 1 MG PO TABS
1.0000 mg | ORAL_TABLET | Freq: Every day | ORAL | 4 refills | Status: AC
  Filled 2023-10-13: qty 90, 90d supply, fill #0
  Filled 2024-01-09: qty 90, 90d supply, fill #1

## 2023-10-16 ENCOUNTER — Other Ambulatory Visit (HOSPITAL_COMMUNITY): Payer: Self-pay

## 2023-11-03 DIAGNOSIS — Z1231 Encounter for screening mammogram for malignant neoplasm of breast: Secondary | ICD-10-CM | POA: Diagnosis not present

## 2023-11-11 ENCOUNTER — Other Ambulatory Visit (HOSPITAL_COMMUNITY): Payer: Self-pay

## 2023-11-12 ENCOUNTER — Other Ambulatory Visit (HOSPITAL_COMMUNITY): Payer: Self-pay

## 2023-11-17 ENCOUNTER — Other Ambulatory Visit (HOSPITAL_COMMUNITY): Payer: Self-pay

## 2023-11-17 MED ORDER — CHOLESTYRAMINE 4 G PO PACK
4.0000 g | PACK | Freq: Two times a day (BID) | ORAL | 10 refills | Status: AC
  Filled 2023-11-17: qty 60, 30d supply, fill #0
  Filled 2024-01-09: qty 60, 30d supply, fill #1
  Filled 2024-02-18: qty 180, 90d supply, fill #2

## 2023-11-18 ENCOUNTER — Other Ambulatory Visit: Payer: Self-pay

## 2023-11-18 ENCOUNTER — Other Ambulatory Visit (HOSPITAL_COMMUNITY): Payer: Self-pay

## 2023-11-19 ENCOUNTER — Other Ambulatory Visit (HOSPITAL_COMMUNITY): Payer: Self-pay

## 2023-11-19 ENCOUNTER — Encounter: Payer: Self-pay | Admitting: Cardiology

## 2023-11-20 ENCOUNTER — Other Ambulatory Visit (HOSPITAL_COMMUNITY): Payer: Self-pay

## 2023-11-23 ENCOUNTER — Other Ambulatory Visit (HOSPITAL_COMMUNITY): Payer: Self-pay

## 2023-11-23 ENCOUNTER — Other Ambulatory Visit: Payer: Self-pay | Admitting: Cardiology

## 2023-11-24 ENCOUNTER — Other Ambulatory Visit (HOSPITAL_COMMUNITY): Payer: Self-pay

## 2023-11-24 ENCOUNTER — Other Ambulatory Visit: Payer: Self-pay

## 2023-11-24 MED ORDER — AMLODIPINE BESYLATE 10 MG PO TABS
10.0000 mg | ORAL_TABLET | Freq: Every day | ORAL | 3 refills | Status: AC
  Filled 2023-11-24: qty 90, 90d supply, fill #0
  Filled 2024-02-18: qty 90, 90d supply, fill #1

## 2023-11-24 NOTE — Telephone Encounter (Signed)
 Amlodipine  refill sent to preferred pharmacy.

## 2023-11-26 ENCOUNTER — Other Ambulatory Visit: Payer: Self-pay

## 2023-11-26 ENCOUNTER — Other Ambulatory Visit (HOSPITAL_COMMUNITY): Payer: Self-pay

## 2023-11-26 DIAGNOSIS — L308 Other specified dermatitis: Secondary | ICD-10-CM | POA: Diagnosis not present

## 2023-11-26 MED ORDER — MESALAMINE 1.2 G PO TBEC
2.4000 g | DELAYED_RELEASE_TABLET | Freq: Two times a day (BID) | ORAL | 3 refills | Status: AC
  Filled 2023-11-26: qty 360, 90d supply, fill #0
  Filled 2024-02-19: qty 360, 90d supply, fill #1

## 2023-12-07 ENCOUNTER — Other Ambulatory Visit (HOSPITAL_COMMUNITY): Payer: Self-pay

## 2023-12-08 ENCOUNTER — Encounter: Payer: Self-pay | Admitting: Hematology and Oncology

## 2023-12-08 ENCOUNTER — Other Ambulatory Visit (HOSPITAL_COMMUNITY): Payer: Self-pay

## 2023-12-08 MED ORDER — FLUZONE 0.5 ML IM SUSY
0.5000 mL | PREFILLED_SYRINGE | Freq: Once | INTRAMUSCULAR | 0 refills | Status: AC
  Filled 2023-12-08: qty 0.5, 1d supply, fill #0

## 2024-01-11 ENCOUNTER — Other Ambulatory Visit (HOSPITAL_COMMUNITY): Payer: Self-pay

## 2024-01-12 ENCOUNTER — Other Ambulatory Visit: Payer: Self-pay

## 2024-01-18 ENCOUNTER — Other Ambulatory Visit (HOSPITAL_COMMUNITY): Payer: Self-pay

## 2024-01-18 ENCOUNTER — Other Ambulatory Visit: Payer: Self-pay

## 2024-01-19 ENCOUNTER — Other Ambulatory Visit: Payer: Self-pay

## 2024-01-20 ENCOUNTER — Other Ambulatory Visit: Payer: Self-pay

## 2024-01-20 ENCOUNTER — Other Ambulatory Visit (HOSPITAL_COMMUNITY): Payer: Self-pay

## 2024-01-20 MED ORDER — GOLYTELY 236 G PO SOLR
ORAL | 0 refills | Status: AC
  Filled 2024-01-20: qty 4000, 1d supply, fill #0

## 2024-02-01 DIAGNOSIS — K5140 Inflammatory polyps of colon without complications: Secondary | ICD-10-CM | POA: Diagnosis not present

## 2024-02-01 DIAGNOSIS — K6289 Other specified diseases of anus and rectum: Secondary | ICD-10-CM | POA: Diagnosis not present

## 2024-02-01 DIAGNOSIS — K529 Noninfective gastroenteritis and colitis, unspecified: Secondary | ICD-10-CM | POA: Diagnosis not present

## 2024-02-01 DIAGNOSIS — Z1211 Encounter for screening for malignant neoplasm of colon: Secondary | ICD-10-CM | POA: Diagnosis not present

## 2024-02-01 DIAGNOSIS — K5100 Ulcerative (chronic) pancolitis without complications: Secondary | ICD-10-CM | POA: Diagnosis not present

## 2024-02-01 DIAGNOSIS — K635 Polyp of colon: Secondary | ICD-10-CM | POA: Diagnosis not present

## 2024-02-01 DIAGNOSIS — K6389 Other specified diseases of intestine: Secondary | ICD-10-CM | POA: Diagnosis not present

## 2024-02-01 DIAGNOSIS — K558 Other vascular disorders of intestine: Secondary | ICD-10-CM | POA: Diagnosis not present

## 2024-02-19 ENCOUNTER — Other Ambulatory Visit (HOSPITAL_COMMUNITY): Payer: Self-pay

## 2024-02-24 ENCOUNTER — Other Ambulatory Visit (HOSPITAL_COMMUNITY): Payer: Self-pay

## 2024-02-24 ENCOUNTER — Other Ambulatory Visit: Payer: Self-pay

## 2024-02-24 ENCOUNTER — Encounter: Payer: Self-pay | Admitting: Hematology and Oncology

## 2024-02-25 ENCOUNTER — Other Ambulatory Visit (HOSPITAL_COMMUNITY): Payer: Self-pay

## 2024-02-25 ENCOUNTER — Other Ambulatory Visit: Payer: Self-pay

## 2024-02-25 ENCOUNTER — Encounter: Payer: Self-pay | Admitting: Hematology and Oncology

## 2024-02-26 ENCOUNTER — Other Ambulatory Visit (HOSPITAL_COMMUNITY): Payer: Self-pay

## 2024-02-26 ENCOUNTER — Other Ambulatory Visit: Payer: Self-pay

## 2024-03-07 ENCOUNTER — Other Ambulatory Visit (HOSPITAL_COMMUNITY): Payer: Self-pay

## 2024-03-15 ENCOUNTER — Other Ambulatory Visit (HOSPITAL_COMMUNITY): Payer: Self-pay

## 2024-03-15 ENCOUNTER — Other Ambulatory Visit: Payer: Self-pay

## 2024-03-15 ENCOUNTER — Ambulatory Visit
Admission: EM | Admit: 2024-03-15 | Discharge: 2024-03-15 | Disposition: A | Discharge status: Home / Self Care | Attending: Physician Assistant | Admitting: Physician Assistant

## 2024-03-15 ENCOUNTER — Encounter: Payer: Self-pay | Admitting: Emergency Medicine

## 2024-03-15 DIAGNOSIS — J0120 Acute ethmoidal sinusitis, unspecified: Secondary | ICD-10-CM

## 2024-03-15 MED ORDER — AMOXICILLIN-POT CLAVULANATE 875-125 MG PO TABS
1.0000 | ORAL_TABLET | Freq: Two times a day (BID) | ORAL | 0 refills | Status: AC
  Filled 2024-03-15 (×2): qty 20, 10d supply, fill #0

## 2024-03-15 NOTE — ED Triage Notes (Signed)
 Patient presents for nasal congestion x 3 weeks.  Patient loss her voice the first week.  Patient has some green mucous.  Patient denies and coughing.  Patient has taken some medication and nasal spray and its not helping.

## 2024-03-15 NOTE — ED Provider Notes (Signed)
 " [facility]    CSN: [id number] Arrival date & time: 03/15/24  1055      History   Chief Complaint Chief Complaint  Patient presents with   Nasal Congestion    HPI Jamie Duarte is a 44 y.o. female.   Patient complains of cough congestion.  Patient reports that she has sinus drainage.  Patient reports that she has facial pressure and some discomfort.  Patient has had sinus infections in the past.  Patient reports symptoms started 3 weeks ago and is not getting any better.  Patient has had nasal congestion unrelieved by nasal spray.     Past Medical History:  Diagnosis Date   Family history of adverse reaction to anesthesia    mom hard to wake up   Herpes    Hypertension    PSC (primary sclerosing cholangitis) (HCC)    Ulcerative colitis (HCC)    Vaginal Pap smear, abnormal     Patient Active Problem List   Diagnosis Date Noted   Postpartum care following cesarean delivery 06/11/2022   Preterm labor in third trimester 04/26/2022   Cervical incompetence affecting management of pregnancy in second trimester, antepartum 12/28/2021   Chronic hypertension 09/25/2021   HTN in pregnancy, chronic 05/09/2021   Mixed hyperlipidemia 05/09/2021   Primary osteoarthritis of both knees 04/06/2018   Iron deficiency anemia due to chronic blood loss 06/24/2017   Ulcerative colitis (HCC) 03/04/2016   Trochanteric bursitis of both hips 03/04/2016   ANA positive 03/04/2016   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   High risk medication use 03/04/2016   Autoimmune disease 03/04/2016   History of hypertension 03/04/2016   History of hypothyroidism 03/04/2016   Urticaria 06/06/2015    Past Surgical History:  Procedure Laterality Date   BREAST LUMPECTOMY Left 1998   CERCLAGE REMOVAL     CERVICAL CERCLAGE N/A 12/28/2021   Procedure: CERCLAGE CERVICAL;  Surgeon: Rutherford Gain, MD;  Location: [facility];  Service: Gynecology;  Laterality: N/A;   CESAREAN  SECTION  2009   CESAREAN SECTION WITH BILATERAL TUBAL LIGATION N/A 06/11/2022   Procedure: CESAREAN SECTION WITH BILATERAL TUBAL LIGATION;  Surgeon: Rutherford Gain, MD;  Location: [facility];  Service: Obstetrics;  Laterality: N/A;   LEEP      OB History     Gravida  4   Para  3   Term  1   Preterm  2   AB  1   Living  1      SAB  1   IAB      Ectopic      Multiple  1   Live Births  4            Home Medications    Prior to Admission medications  Medication Sig Start Date End Date Taking? Authorizing Provider  amoxicillin -clavulanate (AUGMENTIN ) 875-125 MG tablet Take 1 tablet by mouth 2 (two) times daily. 03/15/24  Yes Savayah Waltrip K, PA-C  amLODipine  (NORVASC ) 10 MG tablet Take 1 tablet (10 mg total) by mouth daily for blood pressure 11/24/23   Tobb, Kardie, DO  cholestyramine  (QUESTRAN ) 4 g packet Take 1 packet (4 g total) by mouth mixed with water  or non-carbonated drink 2 (two) times daily. 11/17/23     folic acid  (FOLVITE ) 1 MG tablet Take 1 tablet by mouth every day . 08/27/22     folic acid  (FOLVITE ) 1 MG tablet Take 1 tablet by mouth every day . 10/13/23  hydrochlorothiazide  (MICROZIDE ) 12.5 MG capsule Take 1 capsule (12.5 mg total) by mouth every other day. 06/24/22 09/22/22  Tobb, Kardie, DO  losartan  (COZAAR ) 25 MG tablet Take 1 tablet (25 mg total) by mouth daily. 09/11/23 06/10/24  Tobb, Kardie, DO  mesalamine  (LIALDA ) 1.2 g EC tablet Take 2 tablets (2.4 g total) by mouth 2 (two) times daily with a meal. 11/26/23     Omega-3 Fatty Acids (FISH OIL) 1000 MG CAPS Take 1,000 mg by mouth daily.    [provider]  polyethylene glycol (GOLYTELY ) 236 g solution Take 4000 mL orally as directed. 01/18/24     Prenatal Vit-Fe Fumarate-FA (PRENATAL PO) Take 1 tablet by mouth daily.    [provider]  ursodiol  (ACTIGALL ) 300 MG capsule Take 2 capsules (600 mg total) by mouth 2 (two) times daily. 07/21/23       Family History Family History   Problem Relation Age of Onset   Hypertension Mother    Healthy Mother    Cancer Mother        breast    Hypertension Father    Healthy Father    Healthy Brother    Healthy Daughter    Allergic rhinitis Neg Hx    Angioedema Neg Hx    Asthma Neg Hx    Atopy Neg Hx    Eczema Neg Hx    Urticaria Neg Hx    Immunodeficiency Neg Hx     Social History Social History[1]   Allergies   Patient has no known allergies.   Review of Systems Review of Systems  All other systems reviewed and are negative.    Physical Exam Triage Vital Signs ED Triage Vitals  Encounter Vitals Group     BP 03/15/24 1144 (!) 165/96     Girls Systolic BP Percentile --      Girls Diastolic BP Percentile --      Boys Systolic BP Percentile --      Boys Diastolic BP Percentile --      Pulse Rate 03/15/24 1144 81     Resp 03/15/24 1144 16     Temp 03/15/24 1144 98 F (36.7 C)     Temp Source 03/15/24 1144 Oral     SpO2 03/15/24 1144 97 %     Weight --      Height --      Head Circumference --      Peak Flow --      Pain Score 03/15/24 1143 0     Pain Loc --      Pain Education --      Exclude from Growth Chart --    No data found.  Updated Vital Signs BP (!) 165/96 (BP Location: Left Arm)   Pulse 81   Temp 98 F (36.7 C) (Oral)   Resp 16   LMP 02/20/2024 (Exact Date)   SpO2 97%   Breastfeeding Yes   Visual Acuity Right Eye Distance:   Left Eye Distance:   Bilateral Distance:    Right Eye Near:   Left Eye Near:    Bilateral Near:     Physical Exam Vitals and nursing note reviewed.  Constitutional:      Appearance: She is well-developed.  HENT:     Head: Normocephalic.     Right Ear: Tympanic membrane normal.     Left Ear: Tympanic membrane normal.     Ears:     Comments: Minimally tender maxillary and frontal sinuses  Nose: Nose normal.     Mouth/Throat:     Mouth: Mucous membranes are moist.  Cardiovascular:     Rate and Rhythm: Normal rate.  Pulmonary:      Effort: Pulmonary effort is normal.  Abdominal:     General: There is no distension.  Musculoskeletal:        General: Normal range of motion.  Skin:    General: Skin is warm.  Neurological:     General: No focal deficit present.     Mental Status: She is alert and oriented to person, place, and time.      UC Treatments / Results  Labs (all labs ordered are listed, but only abnormal results are displayed) Labs Reviewed - No data to display  EKG   Radiology No results found.  Procedures Procedures (including critical care time)  Medications Ordered in UC Medications - No data to display  Initial Impression / Assessment and Plan / UC Course  I have reviewed the triage vital signs and the nursing notes.  Pertinent labs & imaging results that were available during my care of the patient were reviewed by me and considered in my medical decision making (see chart for details).     Patient counseled on sinus infection treatment she is given a prescription for Augmentin .  Patient is advised to return if any problems. An After Visit Summary was printed and given to the patient.     Final Clinical Impressions(s) / UC Diagnoses   Final diagnoses:  Acute non-recurrent ethmoidal sinusitis     Discharge Instructions      Return if any problems.     ED Prescriptions     Medication Sig Dispense Auth. Provider   amoxicillin -clavulanate (AUGMENTIN ) 875-125 MG tablet Take 1 tablet by mouth 2 (two) times daily. 20 tablet Santosh Petter K, PA-C      PDMP not reviewed this encounter.    [1]  Social History Tobacco Use   Smoking status: Never    Passive exposure: Never   Smokeless tobacco: Never  Vaping Use   Vaping status: Never Used  Substance Use Topics   Alcohol use: Never   Drug use: Never     Fl[place] Sonny POUR, PA-C 03/15/24 1541  "

## 2024-03-15 NOTE — Discharge Instructions (Addendum)
 Return if any problems.

## 2024-07-05 ENCOUNTER — Ambulatory Visit: Admitting: Rheumatology
